# Patient Record
Sex: Female | Born: 1980 | Hispanic: Yes | Marital: Single | Smoking: Current every day smoker
Health system: Southern US, Community
[De-identification: ages and names within clinical notes are randomized; demographics above are authoritative.]

## PROBLEM LIST (undated history)

## (undated) VITALS — BP 124/82 | HR 91 | Temp 97.5°F | Resp 20 | Ht 64.0 in | Wt 178.0 lb

## (undated) DIAGNOSIS — A599 Trichomoniasis, unspecified: Secondary | ICD-10-CM

## (undated) DIAGNOSIS — M797 Fibromyalgia: Secondary | ICD-10-CM

## (undated) DIAGNOSIS — Z720 Tobacco use: Secondary | ICD-10-CM

## (undated) DIAGNOSIS — F209 Schizophrenia, unspecified: Secondary | ICD-10-CM

## (undated) DIAGNOSIS — F329 Major depressive disorder, single episode, unspecified: Secondary | ICD-10-CM

## (undated) DIAGNOSIS — N390 Urinary tract infection, site not specified: Secondary | ICD-10-CM

## (undated) DIAGNOSIS — F419 Anxiety disorder, unspecified: Secondary | ICD-10-CM

## (undated) DIAGNOSIS — F319 Bipolar disorder, unspecified: Secondary | ICD-10-CM

## (undated) DIAGNOSIS — F1010 Alcohol abuse, uncomplicated: Secondary | ICD-10-CM

## (undated) DIAGNOSIS — F32A Depression, unspecified: Secondary | ICD-10-CM

## (undated) DIAGNOSIS — M419 Scoliosis, unspecified: Secondary | ICD-10-CM

## (undated) DIAGNOSIS — F1910 Other psychoactive substance abuse, uncomplicated: Secondary | ICD-10-CM

## (undated) DIAGNOSIS — T7422XA Child sexual abuse, confirmed, initial encounter: Secondary | ICD-10-CM

## (undated) HISTORY — PX: KNEE ARTHROSCOPY: SUR90

## (undated) HISTORY — DX: Other psychoactive substance abuse, uncomplicated: F19.10

## (undated) HISTORY — PX: DILATION AND CURETTAGE OF UTERUS: SHX78

---

## 2009-10-21 ENCOUNTER — Emergency Department (HOSPITAL_COMMUNITY): Admission: EM | Admit: 2009-10-21 | Discharge: 2009-10-21 | Payer: Self-pay | Admitting: Emergency Medicine

## 2010-01-14 NOTE — L&D Delivery Note (Signed)
Delivery Note At 7:44 AM a viable and healthy female was delivered via Vaginal, Spontaneous Delivery (Presentation: Left Occiput Anterior).  APGAR: , ; weight .   Placenta status: Intact, Spontaneous.  Cord: 3 vessels with the following complications: None.  Cord pH: n/a  No difficulty with shoulders.  Anesthesia: Epidural  Episiotomy: None Lacerations: First degree labial and perineal lacerations, superficial Suture Repair: Not bleeding, not repaired Est. Blood Loss (mL):   Mom to postpartum.  Baby to nursery-stable.  [facility] 11/27/2010, 7:57 AM

## 2010-04-14 ENCOUNTER — Emergency Department (HOSPITAL_COMMUNITY)
Admission: EM | Admit: 2010-04-14 | Discharge: 2010-04-14 | Disposition: A | Discharge status: Home / Self Care | Payer: Medicare Other | Attending: Emergency Medicine | Admitting: Emergency Medicine

## 2010-04-14 DIAGNOSIS — F209 Schizophrenia, unspecified: Secondary | ICD-10-CM | POA: Insufficient documentation

## 2010-04-14 DIAGNOSIS — F319 Bipolar disorder, unspecified: Secondary | ICD-10-CM | POA: Insufficient documentation

## 2010-04-14 DIAGNOSIS — F411 Generalized anxiety disorder: Secondary | ICD-10-CM | POA: Insufficient documentation

## 2010-04-14 DIAGNOSIS — Z760 Encounter for issue of repeat prescription: Secondary | ICD-10-CM | POA: Insufficient documentation

## 2010-04-18 ENCOUNTER — Inpatient Hospital Stay (HOSPITAL_COMMUNITY)
Admission: AD | Admit: 2010-04-18 | Discharge: 2010-04-18 | Disposition: A | Discharge status: Home / Self Care | Payer: Medicare Other | Source: Ambulatory Visit | Attending: Obstetrics & Gynecology | Admitting: Obstetrics & Gynecology

## 2010-04-18 DIAGNOSIS — A499 Bacterial infection, unspecified: Secondary | ICD-10-CM | POA: Insufficient documentation

## 2010-04-18 DIAGNOSIS — A5901 Trichomonal vulvovaginitis: Secondary | ICD-10-CM

## 2010-04-18 DIAGNOSIS — O98819 Other maternal infectious and parasitic diseases complicating pregnancy, unspecified trimester: Secondary | ICD-10-CM | POA: Insufficient documentation

## 2010-04-18 DIAGNOSIS — B9689 Other specified bacterial agents as the cause of diseases classified elsewhere: Secondary | ICD-10-CM | POA: Insufficient documentation

## 2010-04-18 DIAGNOSIS — O2390 Unspecified genitourinary tract infection in pregnancy, unspecified trimester: Secondary | ICD-10-CM | POA: Insufficient documentation

## 2010-04-18 DIAGNOSIS — N760 Acute vaginitis: Secondary | ICD-10-CM | POA: Insufficient documentation

## 2010-04-18 DIAGNOSIS — Z331 Pregnant state, incidental: Secondary | ICD-10-CM

## 2010-04-18 LAB — WET PREP, GENITAL: Yeast Wet Prep HPF POC: NONE SEEN

## 2010-04-19 LAB — GC/CHLAMYDIA PROBE AMP, GENITAL
Chlamydia, DNA Probe: NEGATIVE
GC Probe Amp, Genital: NEGATIVE

## 2010-05-16 ENCOUNTER — Other Ambulatory Visit: Payer: Self-pay | Admitting: Obstetrics and Gynecology

## 2010-05-16 DIAGNOSIS — Z331 Pregnant state, incidental: Secondary | ICD-10-CM

## 2010-05-16 DIAGNOSIS — O99340 Other mental disorders complicating pregnancy, unspecified trimester: Secondary | ICD-10-CM

## 2010-05-16 DIAGNOSIS — O3680X0 Pregnancy with inconclusive fetal viability, not applicable or unspecified: Secondary | ICD-10-CM

## 2010-05-16 DIAGNOSIS — O2390 Unspecified genitourinary tract infection in pregnancy, unspecified trimester: Secondary | ICD-10-CM

## 2010-05-16 LAB — CULTURE, OB URINE: Urine Culture, OB: 9000

## 2010-05-16 LAB — CBC
HCT: 37 % (ref 36–46)
Hemoglobin: 13.2 g/dL (ref 12.0–16.0)
Platelets: 250 10*3/uL (ref 150–399)

## 2010-05-16 LAB — ABO/RH

## 2010-05-16 LAB — POCT URINALYSIS DIP (DEVICE)
Ketones, ur: NEGATIVE mg/dL
Protein, ur: NEGATIVE mg/dL
Specific Gravity, Urine: 1.01 (ref 1.005–1.030)
Urobilinogen, UA: 0.2 mg/dL (ref 0.0–1.0)
pH: 6 (ref 5.0–8.0)

## 2010-05-16 LAB — RUBELLA ANTIBODY, IGM: Rubella: IMMUNE

## 2010-05-22 ENCOUNTER — Ambulatory Visit (HOSPITAL_COMMUNITY)
Admission: RE | Admit: 2010-05-22 | Discharge: 2010-05-22 | Disposition: A | Discharge status: Home / Self Care | Payer: Medicare Other | Source: Ambulatory Visit | Attending: Obstetrics and Gynecology | Admitting: Obstetrics and Gynecology

## 2010-05-22 ENCOUNTER — Ambulatory Visit (HOSPITAL_COMMUNITY): Payer: Medicare Other

## 2010-05-22 DIAGNOSIS — Z3689 Encounter for other specified antenatal screening: Secondary | ICD-10-CM | POA: Insufficient documentation

## 2010-05-22 DIAGNOSIS — O3680X0 Pregnancy with inconclusive fetal viability, not applicable or unspecified: Secondary | ICD-10-CM

## 2010-06-13 ENCOUNTER — Other Ambulatory Visit: Payer: Self-pay | Admitting: Obstetrics and Gynecology

## 2010-06-13 DIAGNOSIS — O99330 Smoking (tobacco) complicating pregnancy, unspecified trimester: Secondary | ICD-10-CM

## 2010-06-13 DIAGNOSIS — O99340 Other mental disorders complicating pregnancy, unspecified trimester: Secondary | ICD-10-CM

## 2010-06-13 DIAGNOSIS — Z331 Pregnant state, incidental: Secondary | ICD-10-CM

## 2010-06-13 LAB — POCT URINALYSIS DIP (DEVICE)
Glucose, UA: NEGATIVE mg/dL
Hgb urine dipstick: NEGATIVE
Nitrite: NEGATIVE
Protein, ur: NEGATIVE mg/dL
Specific Gravity, Urine: >=1.03 (ref 1.005–1.030)
Urobilinogen, UA: 0.2 mg/dL (ref 0.0–1.0)
pH: 6 (ref 5.0–8.0)

## 2010-06-14 ENCOUNTER — Ambulatory Visit: Payer: Medicare Other | Admitting: Obstetrics and Gynecology

## 2010-07-11 ENCOUNTER — Other Ambulatory Visit: Payer: Self-pay | Admitting: Obstetrics and Gynecology

## 2010-07-11 DIAGNOSIS — O99340 Other mental disorders complicating pregnancy, unspecified trimester: Secondary | ICD-10-CM

## 2010-07-11 DIAGNOSIS — Z3689 Encounter for other specified antenatal screening: Secondary | ICD-10-CM

## 2010-07-11 LAB — POCT URINALYSIS DIP (DEVICE)
Bilirubin Urine: NEGATIVE
Hgb urine dipstick: NEGATIVE
Leukocytes, UA: NEGATIVE
Nitrite: NEGATIVE
Protein, ur: NEGATIVE mg/dL
Urobilinogen, UA: 0.2 mg/dL (ref 0.0–1.0)
pH: 6 (ref 5.0–8.0)

## 2010-07-12 ENCOUNTER — Ambulatory Visit (HOSPITAL_COMMUNITY)
Admission: RE | Admit: 2010-07-12 | Discharge: 2010-07-12 | Disposition: A | Discharge status: Home / Self Care | Payer: Medicare Other | Source: Ambulatory Visit | Attending: Obstetrics and Gynecology | Admitting: Obstetrics and Gynecology

## 2010-07-12 DIAGNOSIS — Z3689 Encounter for other specified antenatal screening: Secondary | ICD-10-CM | POA: Insufficient documentation

## 2010-08-01 DIAGNOSIS — F1920 Other psychoactive substance dependence, uncomplicated: Secondary | ICD-10-CM

## 2010-08-01 DIAGNOSIS — O99320 Drug use complicating pregnancy, unspecified trimester: Secondary | ICD-10-CM

## 2010-08-01 DIAGNOSIS — O99330 Smoking (tobacco) complicating pregnancy, unspecified trimester: Secondary | ICD-10-CM

## 2010-08-01 DIAGNOSIS — O99340 Other mental disorders complicating pregnancy, unspecified trimester: Secondary | ICD-10-CM

## 2010-08-17 ENCOUNTER — Inpatient Hospital Stay (HOSPITAL_COMMUNITY)
Admission: AD | Admit: 2010-08-17 | Discharge: 2010-08-17 | Disposition: A | Discharge status: Home / Self Care | Payer: Medicare Other | Source: Ambulatory Visit | Attending: Obstetrics and Gynecology | Admitting: Obstetrics and Gynecology

## 2010-08-17 ENCOUNTER — Encounter (HOSPITAL_COMMUNITY): Payer: Self-pay

## 2010-08-17 DIAGNOSIS — W010XXA Fall on same level from slipping, tripping and stumbling without subsequent striking against object, initial encounter: Secondary | ICD-10-CM | POA: Insufficient documentation

## 2010-08-17 DIAGNOSIS — O99891 Other specified diseases and conditions complicating pregnancy: Secondary | ICD-10-CM | POA: Insufficient documentation

## 2010-08-17 DIAGNOSIS — Z3400 Encounter for supervision of normal first pregnancy, unspecified trimester: Secondary | ICD-10-CM

## 2010-08-17 DIAGNOSIS — Y92009 Unspecified place in unspecified non-institutional (private) residence as the place of occurrence of the external cause: Secondary | ICD-10-CM | POA: Insufficient documentation

## 2010-08-17 DIAGNOSIS — R109 Unspecified abdominal pain: Secondary | ICD-10-CM | POA: Insufficient documentation

## 2010-08-17 HISTORY — DX: Bipolar disorder, unspecified: F31.9

## 2010-08-17 HISTORY — DX: Child sexual abuse, confirmed, initial encounter: T74.22XA

## 2010-08-17 HISTORY — DX: Anxiety disorder, unspecified: F41.9

## 2010-08-17 HISTORY — DX: Schizophrenia, unspecified: F20.9

## 2010-08-17 HISTORY — DX: Trichomoniasis, unspecified: A59.9

## 2010-08-17 NOTE — ED Provider Notes (Signed)
Pt discussed with the resident and agree with her hx and physical.  Pt s/p obs for 2 hours.  FHT w/accel, no decel.  Baseline 140s, mod var.  Will d/c home with warnings and close follow up.
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
## 2010-08-17 NOTE — Progress Notes (Signed)
Pt fell in the abdomen and hit on the right lower side this am. No vaginal bleeding

## 2010-08-17 NOTE — Progress Notes (Signed)
Pt states slipped and fell in the shower and hit her R side. Was having a lot of pain on arrival but states no pain at this time. Positive FHR with movement, no bleeding or leaking.

## 2010-08-29 ENCOUNTER — Other Ambulatory Visit: Payer: Self-pay | Admitting: Obstetrics & Gynecology

## 2010-08-29 ENCOUNTER — Ambulatory Visit: Payer: Medicare Other | Admitting: Family Medicine

## 2010-08-29 ENCOUNTER — Other Ambulatory Visit: Payer: Self-pay | Admitting: Family Medicine

## 2010-08-29 DIAGNOSIS — O99320 Drug use complicating pregnancy, unspecified trimester: Secondary | ICD-10-CM

## 2010-08-29 DIAGNOSIS — O99340 Other mental disorders complicating pregnancy, unspecified trimester: Secondary | ICD-10-CM

## 2010-08-29 DIAGNOSIS — O99330 Smoking (tobacco) complicating pregnancy, unspecified trimester: Secondary | ICD-10-CM

## 2010-08-29 DIAGNOSIS — F1920 Other psychoactive substance dependence, uncomplicated: Secondary | ICD-10-CM

## 2010-08-29 DIAGNOSIS — N898 Other specified noninflammatory disorders of vagina: Secondary | ICD-10-CM

## 2010-08-29 DIAGNOSIS — N760 Acute vaginitis: Secondary | ICD-10-CM

## 2010-08-29 LAB — POCT URINALYSIS DIP (DEVICE)
Ketones, ur: NEGATIVE mg/dL
Nitrite: NEGATIVE
Protein, ur: NEGATIVE mg/dL
Urobilinogen, UA: 0.2 mg/dL (ref 0.0–1.0)
pH: 6.5 (ref 5.0–8.0)

## 2010-08-29 LAB — WET PREP, GENITAL: Yeast Wet Prep HPF POC: NONE SEEN

## 2010-09-18 DIAGNOSIS — O99340 Other mental disorders complicating pregnancy, unspecified trimester: Secondary | ICD-10-CM | POA: Insufficient documentation

## 2010-09-18 DIAGNOSIS — O99330 Smoking (tobacco) complicating pregnancy, unspecified trimester: Secondary | ICD-10-CM | POA: Insufficient documentation

## 2010-09-18 DIAGNOSIS — R8271 Bacteriuria: Secondary | ICD-10-CM | POA: Insufficient documentation

## 2010-09-18 DIAGNOSIS — A64 Unspecified sexually transmitted disease: Secondary | ICD-10-CM | POA: Insufficient documentation

## 2010-09-18 DIAGNOSIS — F411 Generalized anxiety disorder: Secondary | ICD-10-CM

## 2010-09-18 DIAGNOSIS — F1910 Other psychoactive substance abuse, uncomplicated: Secondary | ICD-10-CM

## 2010-10-03 ENCOUNTER — Encounter: Payer: Self-pay | Admitting: Obstetrics and Gynecology

## 2010-10-10 ENCOUNTER — Inpatient Hospital Stay (HOSPITAL_COMMUNITY): Payer: Medicare Other

## 2010-10-10 ENCOUNTER — Encounter (HOSPITAL_COMMUNITY): Payer: Self-pay | Admitting: *Deleted

## 2010-10-10 ENCOUNTER — Ambulatory Visit (INDEPENDENT_AMBULATORY_CARE_PROVIDER_SITE_OTHER): Payer: Medicare Other | Admitting: Family Medicine

## 2010-10-10 ENCOUNTER — Inpatient Hospital Stay (HOSPITAL_COMMUNITY)
Admission: AD | Admit: 2010-10-10 | Discharge: 2010-10-10 | Disposition: A | Discharge status: Home / Self Care | Payer: Medicare Other | Source: Ambulatory Visit | Attending: Family Medicine | Admitting: Family Medicine

## 2010-10-10 VITALS — BP 109/69 | HR 103 | Temp 97.1°F | Wt 166.3 lb

## 2010-10-10 DIAGNOSIS — O368390 Maternal care for abnormalities of the fetal heart rate or rhythm, unspecified trimester, not applicable or unspecified: Secondary | ICD-10-CM | POA: Insufficient documentation

## 2010-10-10 DIAGNOSIS — Z3480 Encounter for supervision of other normal pregnancy, unspecified trimester: Secondary | ICD-10-CM

## 2010-10-10 DIAGNOSIS — O368190 Decreased fetal movements, unspecified trimester, not applicable or unspecified: Secondary | ICD-10-CM | POA: Insufficient documentation

## 2010-10-10 HISTORY — DX: Urinary tract infection, site not specified: N39.0

## 2010-10-10 LAB — POCT URINALYSIS DIP (DEVICE)
Hgb urine dipstick: NEGATIVE
Ketones, ur: NEGATIVE mg/dL
Protein, ur: NEGATIVE mg/dL
Specific Gravity, Urine: <=1.005 (ref 1.005–1.030)

## 2010-10-10 NOTE — Progress Notes (Signed)
Edema: fingers  Pain: pelvic  Pressure: pelvic, vagina C/o rectal bleed a week ago.

## 2010-10-10 NOTE — Progress Notes (Signed)
Subjective:    Nastassja Witkop is a 30 y.o. female being seen today for her obstetrical visit. She is at [redacted]w[redacted]d gestation. Patient reports decrease in fetal movement. No bleeding, LOF or contractions.. Fetal movement: decreased.  Menstrual History: OB History    Grav Para Term Preterm Abortions TAB SAB Ect Mult Living   3    2 1 1    0       Objective:    BP 109/69  Pulse 103  Temp 97.1 F (36.2 C)  Wt 166 lb 4.8 oz (75.433 kg) FHT:  138 BPM  Uterine Size: size equals dates and 31.5  Presentation: cephalic     Assessment:    Pregnancy 31 and 5/7 weeks  Decreased Fetal Movement  Plan:    28-week labs reviewed, normal Will send patient to MAU for NST and BPP. Will counsel patient after examination prior to MAU discharge. Follow up in 2 Weeks.

## 2010-10-10 NOTE — Plan of Care (Signed)
Pt is not in the lobby when called to triage 

## 2010-10-10 NOTE — ED Provider Notes (Signed)
I saw this patient in clinic earlier today and had her come to MAU for NST and BPP. No new complaints.  NST reactive, Category 1. BPP 6/8, no breathing sustained.  Will discharge from MAU with precautions. Pt has appt on Oct 3 in [facility].

## 2010-10-11 ENCOUNTER — Encounter: Payer: Self-pay | Admitting: Obstetrics & Gynecology

## 2010-10-17 ENCOUNTER — Encounter: Payer: Medicare Other | Admitting: Obstetrics and Gynecology

## 2010-10-31 ENCOUNTER — Ambulatory Visit (INDEPENDENT_AMBULATORY_CARE_PROVIDER_SITE_OTHER): Payer: Medicare Other | Admitting: Advanced Practice Midwife

## 2010-10-31 VITALS — BP 108/64 | Temp 97.7°F | Wt 175.1 lb

## 2010-10-31 DIAGNOSIS — J029 Acute pharyngitis, unspecified: Secondary | ICD-10-CM

## 2010-10-31 DIAGNOSIS — Z3490 Encounter for supervision of normal pregnancy, unspecified, unspecified trimester: Secondary | ICD-10-CM

## 2010-10-31 DIAGNOSIS — Z3480 Encounter for supervision of other normal pregnancy, unspecified trimester: Secondary | ICD-10-CM

## 2010-10-31 LAB — POCT URINALYSIS DIP (DEVICE)
Bilirubin Urine: NEGATIVE
Glucose, UA: NEGATIVE mg/dL
Hgb urine dipstick: NEGATIVE
Specific Gravity, Urine: 1.025 (ref 1.005–1.030)
Urobilinogen, UA: 0.2 mg/dL (ref 0.0–1.0)
pH: 6.5 (ref 5.0–8.0)

## 2010-10-31 MED ORDER — PENICILLIN V POTASSIUM 500 MG PO TABS: 500.0000 mg | ORAL_TABLET | Freq: Three times a day (TID) | ORAL | Status: AC

## 2010-10-31 NOTE — Progress Notes (Signed)
Pulse- 106.  No vaginal discharge.  Pt c/o of "sinus infection, ringing in ears" Pt was given info on flu vaccine, will not get vaccine today due to not feeling well.

## 2010-10-31 NOTE — Patient Instructions (Addendum)
Pregnancy - Third Trimester The third trimester begins at the 28th week of pregnancy and ends at birth. It is important to follow your doctor's instructions. HOME CARE  Keep your doctor's appointments.   Do not smoke.   Do not drink alcohol or use drugs.   Only take medicine the doctor tells you to take.   Take prenatal vitamins as directed. The vitamin should contain 1 milligram of folic acid.   Exercise.   Eat healthy foods. Eat regular, well-balanced meals.   You can have sex (intercourse) if there are no other problems with the pregnancy.   Do not use hot tubs, steam rooms, or saunas.   Wear a seat belt while driving.   Avoid raw meat, uncooked cheese, and litter boxes and soil used by cats.   Rest with your legs raised (elevated).   Make a list of emergency phone numbers. Keep this list with you.   Arrange for help when you come back home after delivering the baby.   Make a trial run to the hospital.   Take prenatal classes.   Prepare the baby's nursery.   Do not travel out of the city. If you absolutely have to, get permission from your doctor first.   Wear flat shoes. Do not wear high heels.  GET HELP IF:  You have any concerns or worries during your pregnancy.  GET HELP RIGHT AWAY IF:  You have a temperature by mouth above 100.5, not controlled by medicine.   You have not felt the baby move for more than 1 hour. If you think the baby is not moving as much as normal, eat something with sugar in it or lie down on your left side for an hour. The baby should move at least 4 to 5 times per hour.   Fluid is coming from the vagina.   Blood is coming from the vagina. Light spotting is common, especially after sex (intercourse).   You have belly (abdominal) pain.   You have a bad smelling fluid (discharge) coming from the vagina. The fluid changes from clear to white.   You still feel sick to your stomach (nauseous).   You throw up (vomit) more than 24  hours.   You have the chills.   You have shortness of breath.   You have a burning feeling when you pee (urinate).   You loose or gain more than 2 pounds (0.9 kilograms) of weight over a weeks time, or as suggested by your doctor.   Your face, hands, feet, or legs get puffy (swell).   You have a bad headache that will not go away.   You start to have problems seeing (blurry or double vision).   You fall, are in a car accident, or have any kind of trauma.   There is mental or physical violence at home.  MAKE SURE YOU:   Understand these instructions.   Will watch your condition.   Will get help right away if you are not doing well or get worse.  Document Released: 03/27/2009  [employer] Patient Information 2011 [employer]. 

## 2010-10-31 NOTE — Progress Notes (Signed)
Addended by: Archie Patten on: 10/31/2010 11:36 AM   Modules accepted: Orders

## 2010-10-31 NOTE — Progress Notes (Signed)
Pt seen with PA student, agree with above note.

## 2010-10-31 NOTE — Progress Notes (Signed)
[name] is a 30 y.o. G30P0020 Female seen today for her obstetrical visit. She complains of sore throat, difficulty swallowing, and sinus congestion for the past 10 days. She does not think she has had a fever, but has not checked her temp. + exposure to strep throat x 2 weeks ago. Otherwise, she has no complaints. Still feels good fetal movement. Denies loss of fluid, vaginal bleeding, or regular contractions. Some BH contractions. Discussed signs of labor. Performed rapid strep throat swab for PE signs of lymphadenopathy, erythematous posterior oropharynx w/ exudate. Rx Penicillin V 500 mg TID x 10 days.

## 2010-11-02 LAB — CULTURE, GROUP A STREP

## 2010-11-14 ENCOUNTER — Inpatient Hospital Stay (HOSPITAL_COMMUNITY)
Admission: AD | Admit: 2010-11-14 | Discharge: 2010-11-14 | Disposition: A | Discharge status: Home / Self Care | Payer: Medicare Other | Source: Ambulatory Visit | Attending: Obstetrics & Gynecology | Admitting: Obstetrics & Gynecology

## 2010-11-14 DIAGNOSIS — O4700 False labor before 37 completed weeks of gestation, unspecified trimester: Secondary | ICD-10-CM | POA: Insufficient documentation

## 2010-11-14 DIAGNOSIS — N760 Acute vaginitis: Secondary | ICD-10-CM | POA: Insufficient documentation

## 2010-11-14 DIAGNOSIS — A499 Bacterial infection, unspecified: Secondary | ICD-10-CM | POA: Insufficient documentation

## 2010-11-14 DIAGNOSIS — B9689 Other specified bacterial agents as the cause of diseases classified elsewhere: Secondary | ICD-10-CM | POA: Insufficient documentation

## 2010-11-14 LAB — WET PREP, GENITAL: Yeast Wet Prep HPF POC: NONE SEEN

## 2010-11-14 MED ORDER — METRONIDAZOLE 500 MG PO TABS: 500.0000 mg | ORAL_TABLET | Freq: Two times a day (BID) | ORAL | Status: AC

## 2010-11-14 NOTE — Progress Notes (Signed)
Pt sitting straight up in bed.  Maternal tracing noted.

## 2010-11-14 NOTE — Progress Notes (Cosign Needed)
Pt G3 P0 at 36.5wks contracting, denies bleeding and leaking.  Pt denies problems with pregnancy.

## 2010-11-14 NOTE — Progress Notes (Signed)
Pt has been hurting for weeks.
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
## 2010-11-14 NOTE — Progress Notes (Signed)
Notified of pt presenting for labor check. Notified of pt c/o discharge for a week.  Notified of inability to reach cervix.  Order for wet prep.  Will be up to check cervix.

## 2010-11-19 NOTE — ED Provider Notes (Signed)
Agree with above note.  Mattison Stuckey 11/19/2010 11:04 AM   

## 2010-11-21 ENCOUNTER — Encounter: Payer: Self-pay | Admitting: Family Medicine

## 2010-11-21 ENCOUNTER — Ambulatory Visit (INDEPENDENT_AMBULATORY_CARE_PROVIDER_SITE_OTHER): Payer: Medicare Other | Admitting: Family Medicine

## 2010-11-21 VITALS — BP 110/73 | Temp 97.7°F | Wt 180.0 lb

## 2010-11-21 DIAGNOSIS — Z3400 Encounter for supervision of normal first pregnancy, unspecified trimester: Secondary | ICD-10-CM

## 2010-11-21 DIAGNOSIS — Z331 Pregnant state, incidental: Secondary | ICD-10-CM

## 2010-11-21 LAB — POCT URINALYSIS DIP (DEVICE)
Glucose, UA: NEGATIVE mg/dL
Protein, ur: 30 mg/dL — AB
Urobilinogen, UA: 0.2 mg/dL (ref 0.0–1.0)

## 2010-11-21 NOTE — Progress Notes (Signed)
Seeing "floaters" when coughs.  Mild swelling in hands and legs, no headache, vision changes.  Occasional contraction.  No other complaint.  No vaginal bleeding, vaginal discharge.  Moderate leukocytes - will send culture.  Good fetal activity.  Will follow up in 1 week.

## 2010-11-21 NOTE — Patient Instructions (Signed)
Pregnancy - Third Trimester The third trimester of pregnancy (the last 3 months) is a period of the most rapid growth for you and your baby. The baby approaches a length of 20 inches and a weight of 6 to 10 pounds. The baby is adding on fat and getting ready for life outside your body. While inside, babies have periods of sleeping and waking, suck their thumbs, and hiccups. You can often feel small contractions of the uterus. This is false labor. It is also called Braxton-Hicks contractions. This is like a practice for labor. The usual problems in this stage of pregnancy include more difficulty breathing, swelling of the hands and feet from water retention, and having to urinate more often because of the uterus and baby pressing on your bladder.  PRENATAL EXAMS  Blood work may continue to be done during prenatal exams. These tests are done to check on your health and the probable health of your baby. Blood work is used to follow your blood levels (hemoglobin). Anemia (low hemoglobin) is common during pregnancy. Iron and vitamins are given to help prevent this. You may also continue to be checked for diabetes. Some of the past blood tests may be done again.   The size of the uterus is measured during each visit. This makes sure your baby is growing properly according to your pregnancy dates.   Your blood pressure is checked every prenatal visit. This is to make sure you are not getting toxemia.   Your urine is checked every prenatal visit for infection, diabetes and protein.   Your weight is checked at each visit. This is done to make sure gains are happening at the suggested rate and that you and your baby are growing normally.   Sometimes, an ultrasound is performed to confirm the position and the proper growth and development of the baby. This is a test done that bounces harmless sound waves off the baby so your caregiver can more accurately determine due dates.   Discuss the type of pain  medication and anesthesia you will have during your labor and delivery.   Discuss the possibility and anesthesia if a Cesarean Section might be necessary.   Inform your caregiver if there is any mental or physical violence at home.  Sometimes, a specialized non-stress test, contraction stress test and biophysical profile are done to make sure the baby is not having a problem. Checking the amniotic fluid surrounding the baby is called an amniocentesis. The amniotic fluid is removed by sticking a needle into the belly (abdomen). This is sometimes done near the end of pregnancy if an early delivery is required. In this case, it is done to help make sure the baby's lungs are mature enough for the baby to live outside of the womb. If the lungs are not mature and it is unsafe to deliver the baby, an injection of cortisone medication is given to the mother 1 to 2 days before the delivery. This helps the baby's lungs mature and makes it safer to deliver the baby. CHANGES OCCURING IN THE THIRD TRIMESTER OF PREGNANCY Your body goes through many changes during pregnancy. They vary from person to person. Talk to your caregiver about changes you notice and are concerned about.  During the last trimester, you have probably had an increase in your appetite. It is normal to have cravings for certain foods. This varies from person to person and pregnancy to pregnancy.   You may begin to get stretch marks on your hips,   abdomen, and breasts. These are normal changes in the body during pregnancy. There are no exercises or medications to take which prevent this change.   Constipation may be treated with a stool softener or adding bulk to your diet. Drinking lots of fluids, fiber in vegetables, fruits, and whole grains are helpful.   Exercising is also helpful. If you have been very active up until your pregnancy, most of these activities can be continued during your pregnancy. If you have been less active, it is helpful  to start an exercise program such as walking. Consult your caregiver before starting exercise programs.   Avoid all smoking, alcohol, un-prescribed drugs, herbs and "street drugs" during your pregnancy. These chemicals affect the formation and growth of the baby. Avoid chemicals throughout the pregnancy to ensure the delivery of a healthy infant.   Backache, varicose veins and hemorrhoids may develop or get worse.   You will tire more easily in the third trimester, which is normal.   The baby's movements may be stronger and more often.   You may become short of breath easily.   Your belly button may stick out.   A yellow discharge may leak from your breasts called colostrum.   You may have a bloody mucus discharge. This usually occurs a few days to a week before labor begins.  HOME CARE INSTRUCTIONS   Keep your caregiver's appointments. Follow your caregiver's instructions regarding medication use, exercise, and diet.   During pregnancy, you are providing food for you and your baby. Continue to eat regular, well-balanced meals. Choose foods such as meat, fish, milk and other low fat dairy products, vegetables, fruits, and whole-grain breads and cereals. Your caregiver will tell you of the ideal weight gain.   A physical sexual relationship may be continued throughout pregnancy if there are no other problems such as early (premature) leaking of amniotic fluid from the membranes, vaginal bleeding, or belly (abdominal) pain.   Exercise regularly if there are no restrictions. Check with your caregiver if you are unsure of the safety of your exercises. Greater weight gain will occur in the last 2 trimesters of pregnancy. Exercising helps:   Control your weight.   Get you in shape for labor and delivery.   You lose weight after you deliver.   Rest a lot with legs elevated, or as needed for leg cramps or low back pain.   Wear a good support or jogging bra for breast tenderness during  pregnancy. This may help if worn during sleep. Pads or tissues may be used in the bra if you are leaking colostrum.   Do not use hot tubs, steam rooms, or saunas.   Wear your seat belt when driving. This protects you and your baby if you are in an accident.   Avoid raw meat, cat litter boxes and soil used by cats. These carry germs that can cause birth defects in the baby.   It is easier to loose urine during pregnancy. Tightening up and strengthening the pelvic muscles will help with this problem. You can practice stopping your urination while you are going to the bathroom. These are the same muscles you need to strengthen. It is also the muscles you would use if you were trying to stop from passing gas. You can practice tightening these muscles up 10 times a set and repeating this about 3 times per day. Once you know what muscles to tighten up, do not perform these exercises during urination. It is more likely   to cause an infection by backing up the urine.   Ask for help if you have financial, counseling or nutritional needs during pregnancy. Your caregiver will be able to offer counseling for these needs as well as refer you for other special needs.   Make a list of emergency phone numbers and have them available.   Plan on getting help from family or friends when you go home from the hospital.   Make a trial run to the hospital.   Take prenatal classes with the father to understand, practice and ask questions about the labor and delivery.   Prepare the baby's room/nursery.   Do not travel out of the city unless it is absolutely necessary and with the advice of your caregiver.   Wear only low or no heal shoes to have better balance and prevent falling.  MEDICATIONS AND DRUG USE IN PREGNANCY  Take prenatal vitamins as directed. The vitamin should contain 1 milligram of folic acid. Keep all vitamins out of reach of children. Only a couple vitamins or tablets containing iron may be fatal  to a baby or young child when ingested.   Avoid use of all medications, including herbs, over-the-counter medications, not prescribed or suggested by your caregiver. Only take over-the-counter or prescription medicines for pain, discomfort, or fever as directed by your caregiver. Do not use aspirin, ibuprofen (Motrin, Advil, Nuprin) or naproxen (Aleve) unless OK'd by your caregiver.   Let your caregiver also know about herbs you may be using.   Alcohol is related to a number of birth defects. This includes fetal alcohol syndrome. All alcohol, in any form, should be avoided completely. Smoking will cause low birth rate and premature babies.   Street/illegal drugs are very harmful to the baby. They are absolutely forbidden. A baby born to an addicted mother will be addicted at birth. The baby will go through the same withdrawal an adult does.  SEEK MEDICAL CARE IF: You have any concerns or worries during your pregnancy. It is better to call with your questions if you feel they cannot wait, rather than worry about them. DECISIONS ABOUT CIRCUMCISION You may or may not know the sex of your baby. If you know your baby is a boy, it may be time to think about circumcision. Circumcision is the removal of the foreskin of the penis. This is the skin that covers the sensitive end of the penis. There is no proven medical need for this. Often this decision is made on what is popular at the time or based upon religious beliefs and social issues. You can discuss these issues with your caregiver or pediatrician. SEEK IMMEDIATE MEDICAL CARE IF:   An unexplained oral temperature above 102 F (38.9 C) develops, or as your caregiver suggests.   You have leaking of fluid from the vagina (birth canal). If leaking membranes are suspected, take your temperature and tell your caregiver of this when you call.   There is vaginal spotting, bleeding or passing clots. Tell your caregiver of the amount and how many pads are  used.   You develop a bad smelling vaginal discharge with a change in the color from clear to white.   You develop vomiting that lasts more than 24 hours.   You develop chills or fever.   You develop shortness of breath.   You develop burning on urination.   You loose more than 2 pounds of weight or gain more than 2 pounds of weight or as suggested by your   caregiver.   You notice sudden swelling of your face, hands, and feet or legs.   You develop belly (abdominal) pain. Round ligament discomfort is a common non-cancerous (benign) cause of abdominal pain in pregnancy. Your caregiver still must evaluate you.   You develop a severe headache that does not go away.   You develop visual problems, blurred or double vision.   If you have not felt your baby move for more than 1 hour. If you think the baby is not moving as much as usual, eat something with sugar in it and lie down on your left side for an hour. The baby should move at least 4 to 5 times per hour. Call right away if your baby moves less than that.   You fall, are in a car accident or any kind of trauma.   There is mental or physical violence at home.  Document Released: 12/25/2000 Document Revised: 09/12/2010 Document Reviewed: 06/29/2008 [employer] Patient Information 2012 [employer]. 

## 2010-11-21 NOTE — Progress Notes (Signed)
Pulse 96. Pelvic pressure and leg pain. Pt states sees "spots when coughing". No vaginal discharge. Swelling in hands. Pt never started the Flagyl that was prescribed.

## 2010-11-23 ENCOUNTER — Telehealth: Payer: Self-pay | Admitting: *Deleted

## 2010-11-23 LAB — CULTURE, OB URINE: Colony Count: 15000

## 2010-11-23 MED ORDER — AMOXICILLIN 500 MG PO CAPS: 500.0000 mg | ORAL_CAPSULE | Freq: Two times a day (BID) | ORAL | Status: AC

## 2010-11-23 NOTE — Telephone Encounter (Signed)
Spoke w/pt and informed of urine cx results and need for treatment. Pt states she is allergic to Keflex and has not had problems with penicillins. Rx sent as prescribed for amoxicillin. Pt voiced understanding.

## 2010-11-23 NOTE — Telephone Encounter (Signed)
Message copied by Jill Side on Fri Nov 23, 2010  1:11 PM ------      Message from: Levie Heritage      Created: Fri Nov 23, 2010 10:30 AM       Please call patient with positive urine culture and see if she is able to take amoxicillin.  If so, please call in amoxicillin 500mg  bid.  If not able to take amoxicillin, then needs to take clindamycin 300mg  tid.        Thanks.

## 2010-11-27 ENCOUNTER — Encounter (HOSPITAL_COMMUNITY): Payer: Self-pay | Admitting: *Deleted

## 2010-11-27 ENCOUNTER — Encounter (HOSPITAL_COMMUNITY): Payer: Self-pay | Admitting: Anesthesiology

## 2010-11-27 ENCOUNTER — Inpatient Hospital Stay (HOSPITAL_COMMUNITY)
Admission: AD | Admit: 2010-11-27 | Discharge: 2010-11-29 | DRG: 775 | Disposition: A | Discharge status: Home / Self Care | Payer: Medicare Other | Source: Ambulatory Visit | Attending: Obstetrics & Gynecology | Admitting: Obstetrics & Gynecology

## 2010-11-27 ENCOUNTER — Inpatient Hospital Stay (HOSPITAL_COMMUNITY): Payer: Medicare Other | Admitting: Anesthesiology

## 2010-11-27 DIAGNOSIS — O99330 Smoking (tobacco) complicating pregnancy, unspecified trimester: Secondary | ICD-10-CM

## 2010-11-27 DIAGNOSIS — Z3400 Encounter for supervision of normal first pregnancy, unspecified trimester: Secondary | ICD-10-CM

## 2010-11-27 DIAGNOSIS — O99892 Other specified diseases and conditions complicating childbirth: Secondary | ICD-10-CM

## 2010-11-27 DIAGNOSIS — A64 Unspecified sexually transmitted disease: Secondary | ICD-10-CM

## 2010-11-27 DIAGNOSIS — O9989 Other specified diseases and conditions complicating pregnancy, childbirth and the puerperium: Secondary | ICD-10-CM

## 2010-11-27 DIAGNOSIS — F1910 Other psychoactive substance abuse, uncomplicated: Secondary | ICD-10-CM

## 2010-11-27 DIAGNOSIS — Z22330 Carrier of Group B streptococcus: Secondary | ICD-10-CM

## 2010-11-27 DIAGNOSIS — F411 Generalized anxiety disorder: Secondary | ICD-10-CM

## 2010-11-27 DIAGNOSIS — O99340 Other mental disorders complicating pregnancy, unspecified trimester: Secondary | ICD-10-CM

## 2010-11-27 DIAGNOSIS — IMO0001 Reserved for inherently not codable concepts without codable children: Secondary | ICD-10-CM

## 2010-11-27 LAB — CBC
HCT: 32.9 % — ABNORMAL LOW (ref 36.0–46.0)
MCH: 31.6 pg (ref 26.0–34.0)
MCV: 90.4 fL (ref 78.0–100.0)
Platelets: 212 10*3/uL (ref 150–400)
RBC: 3.64 MIL/uL — ABNORMAL LOW (ref 3.87–5.11)

## 2010-11-27 MED ORDER — BENZOCAINE-MENTHOL 20-0.5 % EX AERO
1.0000 "application " | INHALATION_SPRAY | CUTANEOUS | Status: DC | PRN
  Administered 2010-11-27: 1 via TOPICAL

## 2010-11-27 MED ORDER — OXYCODONE-ACETAMINOPHEN 5-325 MG PO TABS
1.0000 | ORAL_TABLET | ORAL | Status: DC | PRN
  Administered 2010-11-27: 1 via ORAL
  Administered 2010-11-27 – 2010-11-28 (×3): 2 via ORAL
  Administered 2010-11-28 – 2010-11-29 (×3): 1 via ORAL
  Administered 2010-11-29: 2 via ORAL
  Filled 2010-11-27 (×3): qty 2
  Filled 2010-11-27 (×2): qty 1
  Filled 2010-11-27 (×3): qty 2

## 2010-11-27 MED ORDER — DIPHENHYDRAMINE HCL 25 MG PO CAPS: 25.0000 mg | ORAL_CAPSULE | Freq: Four times a day (QID) | ORAL | Status: DC | PRN

## 2010-11-27 MED ORDER — OXYCODONE-ACETAMINOPHEN 5-325 MG PO TABS: 2.0000 | ORAL_TABLET | ORAL | Status: DC | PRN

## 2010-11-27 MED ORDER — ONDANSETRON HCL 4 MG/2ML IJ SOLN: 4.0000 mg | INTRAMUSCULAR | Status: DC | PRN

## 2010-11-27 MED ORDER — ZOLPIDEM TARTRATE 5 MG PO TABS: 5.0000 mg | ORAL_TABLET | Freq: Every evening | ORAL | Status: DC | PRN

## 2010-11-27 MED ORDER — ONDANSETRON HCL 4 MG/2ML IJ SOLN: 4.0000 mg | Freq: Four times a day (QID) | INTRAMUSCULAR | Status: DC | PRN

## 2010-11-27 MED ORDER — OXYTOCIN BOLUS FROM INFUSION
500.0000 mL | Freq: Once | INTRAVENOUS | Status: DC
  Filled 2010-11-27: qty 500

## 2010-11-27 MED ORDER — FENTANYL 2.5 MCG/ML BUPIVACAINE 1/10 % EPIDURAL INFUSION (WH - ANES)
INTRAMUSCULAR | Status: DC | PRN
  Administered 2010-11-27: 13 mL/h via EPIDURAL

## 2010-11-27 MED ORDER — LACTATED RINGERS IV SOLN: 500.0000 mL | Freq: Once | INTRAVENOUS | Status: DC

## 2010-11-27 MED ORDER — SODIUM CHLORIDE 0.9 % IV SOLN
2.0000 g | Freq: Four times a day (QID) | INTRAVENOUS | Status: DC
  Administered 2010-11-27: 2 g via INTRAVENOUS
  Filled 2010-11-27 (×3): qty 2000

## 2010-11-27 MED ORDER — PHENYLEPHRINE 40 MCG/ML (10ML) SYRINGE FOR IV PUSH (FOR BLOOD PRESSURE SUPPORT): 80.0000 ug | PREFILLED_SYRINGE | INTRAVENOUS | Status: DC | PRN

## 2010-11-27 MED ORDER — NALBUPHINE SYRINGE 5 MG/0.5 ML: 5.0000 mg | INJECTION | INTRAMUSCULAR | Status: DC | PRN

## 2010-11-27 MED ORDER — OXYTOCIN 20 UNITS IN LACTATED RINGERS INFUSION - SIMPLE
125.0000 mL/h | Freq: Once | INTRAVENOUS | Status: DC
  Administered 2010-11-27: 500 mL/h via INTRAVENOUS
  Filled 2010-11-27: qty 1000

## 2010-11-27 MED ORDER — PHENYLEPHRINE 40 MCG/ML (10ML) SYRINGE FOR IV PUSH (FOR BLOOD PRESSURE SUPPORT)
80.0000 ug | PREFILLED_SYRINGE | INTRAVENOUS | Status: DC | PRN
  Filled 2010-11-27: qty 5

## 2010-11-27 MED ORDER — LANOLIN HYDROUS EX OINT: TOPICAL_OINTMENT | CUTANEOUS | Status: DC | PRN

## 2010-11-27 MED ORDER — PRENATAL PLUS 27-1 MG PO TABS: 1.0000 | ORAL_TABLET | Freq: Every day | ORAL | Status: DC

## 2010-11-27 MED ORDER — DIBUCAINE 1 % RE OINT: 1.0000 "application " | TOPICAL_OINTMENT | RECTAL | Status: DC | PRN

## 2010-11-27 MED ORDER — IBUPROFEN 600 MG PO TABS
600.0000 mg | ORAL_TABLET | Freq: Four times a day (QID) | ORAL | Status: DC
  Administered 2010-11-27 – 2010-11-29 (×9): 600 mg via ORAL
  Filled 2010-11-27 (×9): qty 1

## 2010-11-27 MED ORDER — ONDANSETRON HCL 4 MG PO TABS: 4.0000 mg | ORAL_TABLET | ORAL | Status: DC | PRN

## 2010-11-27 MED ORDER — CITRIC ACID-SODIUM CITRATE 334-500 MG/5ML PO SOLN: 30.0000 mL | ORAL | Status: DC | PRN

## 2010-11-27 MED ORDER — SENNOSIDES-DOCUSATE SODIUM 8.6-50 MG PO TABS
2.0000 | ORAL_TABLET | Freq: Every day | ORAL | Status: DC
  Administered 2010-11-27 – 2010-11-28 (×2): 2 via ORAL

## 2010-11-27 MED ORDER — FENTANYL 2.5 MCG/ML BUPIVACAINE 1/10 % EPIDURAL INFUSION (WH - ANES)
14.0000 mL/h | INTRAMUSCULAR | Status: DC
  Filled 2010-11-27: qty 60

## 2010-11-27 MED ORDER — LIDOCAINE HCL (PF) 1 % IJ SOLN
INTRAMUSCULAR | Status: AC
  Filled 2010-11-27: qty 30

## 2010-11-27 MED ORDER — BENZOCAINE-MENTHOL 20-0.5 % EX AERO
INHALATION_SPRAY | CUTANEOUS | Status: AC
  Filled 2010-11-27: qty 56

## 2010-11-27 MED ORDER — LACTATED RINGERS IV SOLN
INTRAVENOUS | Status: DC
  Administered 2010-11-27 (×2): via INTRAVENOUS
  Administered 2010-11-27: 300 mL via INTRAVENOUS

## 2010-11-27 MED ORDER — FLEET ENEMA 7-19 GM/118ML RE ENEM: 1.0000 | ENEMA | RECTAL | Status: DC | PRN

## 2010-11-27 MED ORDER — LIDOCAINE HCL 1.5 % IJ SOLN
INTRAMUSCULAR | Status: DC | PRN
  Administered 2010-11-27 (×2): 5 mL via EPIDURAL

## 2010-11-27 MED ORDER — EPHEDRINE 5 MG/ML INJ
10.0000 mg | INTRAVENOUS | Status: DC | PRN
  Filled 2010-11-27: qty 4

## 2010-11-27 MED ORDER — IBUPROFEN 600 MG PO TABS: 600.0000 mg | ORAL_TABLET | Freq: Four times a day (QID) | ORAL | Status: DC | PRN

## 2010-11-27 MED ORDER — LIDOCAINE HCL (PF) 1 % IJ SOLN: 30.0000 mL | INTRAMUSCULAR | Status: DC | PRN

## 2010-11-27 MED ORDER — THERA M PLUS PO TABS
1.0000 | ORAL_TABLET | Freq: Every day | ORAL | Status: DC
  Filled 2010-11-27 (×3): qty 1

## 2010-11-27 MED ORDER — DIPHENHYDRAMINE HCL 50 MG/ML IJ SOLN: 12.5000 mg | INTRAMUSCULAR | Status: DC | PRN

## 2010-11-27 MED ORDER — SIMETHICONE 80 MG PO CHEW: 80.0000 mg | CHEWABLE_TABLET | ORAL | Status: DC | PRN

## 2010-11-27 MED ORDER — WITCH HAZEL-GLYCERIN EX PADS: 1.0000 "application " | MEDICATED_PAD | CUTANEOUS | Status: DC | PRN

## 2010-11-27 MED ORDER — LACTATED RINGERS IV SOLN: 500.0000 mL | INTRAVENOUS | Status: DC | PRN

## 2010-11-27 MED ORDER — TETANUS-DIPHTH-ACELL PERTUSSIS 5-2.5-18.5 LF-MCG/0.5 IM SUSP
0.5000 mL | Freq: Once | INTRAMUSCULAR | Status: AC
  Administered 2010-11-28: 0.5 mL via INTRAMUSCULAR
  Filled 2010-11-27: qty 0.5

## 2010-11-27 MED ORDER — ACETAMINOPHEN 325 MG PO TABS: 650.0000 mg | ORAL_TABLET | ORAL | Status: DC | PRN

## 2010-11-27 MED ORDER — EPHEDRINE 5 MG/ML INJ: 10.0000 mg | INTRAVENOUS | Status: DC | PRN

## 2010-11-27 NOTE — Progress Notes (Signed)
UR chart review completed.  

## 2010-11-27 NOTE — Progress Notes (Signed)
P t reports ROM at 0345, contractions since last pm.

## 2010-11-27 NOTE — Anesthesia Preprocedure Evaluation (Signed)
Anesthesia Evaluation  Patient identified by MRN, date of birth, ID band Patient awake    Reviewed: Allergy & Precautions, H&P , NPO status , Patient's Chart, lab work & pertinent test results  Airway Mallampati: II TM Distance: >3 FB Neck ROM: full    Dental No notable dental hx.    Pulmonary neg pulmonary ROS,  clear to auscultation  Pulmonary exam normal       Cardiovascular neg cardio ROS regular Normal    Neuro/Psych Negative Neurological ROS     GI/Hepatic negative GI ROS, Neg liver ROS,   Endo/Other  Negative Endocrine ROS  Renal/GU negative Renal ROS     Musculoskeletal negative musculoskeletal ROS (+)   Abdominal Normal abdominal exam  (+)   Peds negative pediatric ROS (+)  Hematology negative hematology ROS (+)   Anesthesia Other Findings   Reproductive/Obstetrics (+) Pregnancy                           Anesthesia Physical Anesthesia Plan  ASA: II  Anesthesia Plan: Epidural   Post-op Pain Management:    Induction:   Airway Management Planned:   Additional Equipment:   Intra-op Plan:   Post-operative Plan:   Informed Consent: I have reviewed the patients History and Physical, chart, labs and discussed the procedure including the risks, benefits and alternatives for the proposed anesthesia with the patient or authorized representative who has indicated his/her understanding and acceptance.     Plan Discussed with:   Anesthesia Plan Comments:         Anesthesia Quick Evaluation
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
## 2010-11-27 NOTE — Anesthesia Postprocedure Evaluation (Signed)
  Anesthesia Post-op Note  Patient: [name]  Procedure(s) Performed: * No procedures listed *  Patient Location: PACU and Mother/Baby  Anesthesia Type: Epidural  Level of Consciousness: awake, alert  and oriented  Airway and Oxygen Therapy: Patient Spontanous Breathing  Post-op Assessment: Post-op Vital signs reviewed, Patient's Cardiovascular Status Stable, No headache, No backache, No residual numbness and No residual motor weakness  Post-op Vital Signs: Reviewed and stable  Complications: No apparent anesthesia complications

## 2010-11-27 NOTE — ED Provider Notes (Signed)
History     Chief Complaint  Patient presents with  . Labor Eval   HPI Doing well but has increased pain with contractions.,   ROS Physical Exam   Blood pressure 114/67, pulse 88, temperature 97.9 F (36.6 C), resp. rate 18.  Physical Exam EFM:  Reassuring FHR 130s with accels UCs frequent Cx 8cm per RN  MAU Course  Procedures   Assessment and Plan  Active Labor, Transition'  Anticipate SVD soon  [facility] 11/27/2010, 4:56 AM

## 2010-11-28 NOTE — Progress Notes (Signed)
Post Partum Day 1 Subjective: Overall feeling well.  Has some general weakness but no dizziness and is ambulating without difficulty.  Eating and drinking ok.  Lochia=menses.  Still having pain in lower abdomen but this is well controlled with percocet and ibuprofen.  Objective: Blood pressure 94/52, pulse 80, temperature 98.5 F (36.9 C), temperature source Oral, resp. rate 18, SpO2 96.00%, unknown if currently breastfeeding.  Physical Exam:  General: NAD, AXOX3 Lochia: appropriate Uterine Fundus: firm Incision: n/a DVT Evaluation: No evidence of DVT seen on physical exam. No cords or calf tenderness.   Basename 11/27/10 0425  HGB 11.5*  HCT 32.9*    Assessment/Plan: Contraception would like OCPs Bottle feeding, declines lactation consult at this time Plan for discharge home today vs. Tomorrow depending on progress this afternoon Will follow-up in resident clinic.   LOS: 1 day   Drucie Ip 11/28/2010, 7:19 AM

## 2010-11-28 NOTE — Progress Notes (Signed)
PSYCHOSOCIAL ASSESSMENT ~ MATERNAL/CHILD Name: [name]                                                                                          Age: 30  Referral Date:       11 / 14  /12   Reason/Source: Social situation/ CN  I. FAMILY/HOME ENVIRONMENT A. Child's Legal Guardian __X_Parent(s) ___Grandparent ___Foster parent ___DSS_________________ Name: [name]                                     DOB: //                     Age: 30  Address: [address] , [place] [address]  Name:  [name]                                    DOB: //                     Age:  32  Address: [place]  B. Other Household Members/Support Persons Name: [name]               Relationship: mother            DOB ___/___/___                   Name:                                         Relationship:                        DOB ___/___/___                   Name:                                         Relationship:                        DOB ___/___/___                   Name:                                         Relationship:                        DOB ___/___/___  C. Other Support:   II. PSYCHOSOCIAL DATA A. Information Source                                                                                             

## 2010-11-28 NOTE — Anesthesia Postprocedure Evaluation (Signed)
Anesthesia Post Note  Patient: [name]  Procedure(s) Performed: * No procedures listed *  Anesthesia type: Epidural  Patient location: Mother/Baby  Post pain: Pain level controlled  Post assessment: Post-op Vital signs reviewed  Last Vitals:  Filed Vitals:   11/27/10 2300  BP: 103/71  Pulse: 81  Temp: 36.6 C  Resp: 18    Post vital signs: Reviewed  Level of consciousness: awake  Complications: No apparent anesthesia complications

## 2010-11-29 MED ORDER — FERROUS SULFATE 325 (65 FE) MG PO TABS: 325.0000 mg | ORAL_TABLET | Freq: Every day | ORAL | Status: DC

## 2010-11-29 MED ORDER — IBUPROFEN 600 MG PO TABS: 600.0000 mg | ORAL_TABLET | Freq: Four times a day (QID) | ORAL | Status: AC

## 2010-11-29 MED ORDER — SENNOSIDES-DOCUSATE SODIUM 8.6-50 MG PO TABS: 2.0000 | ORAL_TABLET | Freq: Every day | ORAL | Status: DC

## 2010-11-29 NOTE — ED Provider Notes (Signed)
Agree with above note.  [name] H. 11/29/2010 1:08 PM

## 2010-11-29 NOTE — Discharge Summary (Signed)
Obstetric Discharge Summary Reason for Admission: rupture of membranes Prenatal Procedures: NST Intrapartum Procedures: spontaneous vaginal delivery and GBS prophylaxis Postpartum Procedures: none Complications-Operative and Postpartum: none Hemoglobin  Date Value Range Status  11/27/2010 11.5* 12.0-15.0 (g/dL) Final     HCT  Date Value Range Status  11/27/2010 32.9* 36.0-46.0 (%) Final    Discharge Diagnoses: Term Pregnancy-delivered  30 YO G3P1021 at 38.5 weeks presented with spontaneous rupture of membranes and went on to NSVD with epidural anesthesia. Delivery was uncomplicated. Patient sustained first degree labial and perineal lacerations, superficial, not requiring repair. Patient delivered a viable female infant. Will be bottle feeding and has refused birth control. Plans outpatient circumcision.  Discharge Information: Date: 11/29/2010 Activity: pelvic rest Diet: routine Medications: Tylenol #3 and Colace Condition: stable Instructions: refer to practice specific booklet Discharge to: home   Newborn Data: Live born female  Birth Weight: 6 lb 10.8 oz (3028 g) APGAR: 8, 9  Home with mother.  Mosetta Putt 11/29/2010, 8:01 AM  I have seen patient and agree.  Meredeth Furber 11/29/10 9:36 AM

## 2010-11-29 NOTE — Progress Notes (Signed)
Patient is a 30 YO female G3P1021. Post Partum Day 2 Subjective: tolerating PO and suprapubic and lower back pain. No bowel movements, received stool softener this a.m.  Objective: Blood pressure 96/59, pulse 84, temperature 98.3 F (36.8 C), temperature source Oral, resp. rate 20, SpO2 96.00%, unknown if currently breastfeeding.  Physical Exam:  General: alert, cooperative and no distress Cardio: Regular rate & rhythm. No murmurs, rubs, gallops. Lungs: clear to auscultation, no wheezes or rhonchi Abdomen: soft, mildly tender with palpation. Lochia: appropriate Uterine Fundus: firm DVT Evaluation: No cords or calf tenderness.   Basename 11/27/10 0425  HGB 11.5*  HCT 32.9*    Assessment/Plan: Discharge home and Circumcision prior to discharge. Patient will bottle feed and declined birth control.    LOS: 2 days   Mosetta Putt 11/29/2010, 7:27 AM   I have seen and examined pt. Agree with above.  Deon Duer 11/29/10 7:59 AM

## 2010-11-29 NOTE — H&P (Signed)
Agree with above note.  [name] H. 11/29/2010 1:10 PM  

## 2011-01-02 ENCOUNTER — Ambulatory Visit: Payer: Medicare Other | Admitting: Advanced Practice Midwife

## 2011-01-02 ENCOUNTER — Ambulatory Visit: Payer: Medicare Other | Admitting: Obstetrics and Gynecology

## 2011-02-26 ENCOUNTER — Emergency Department (HOSPITAL_COMMUNITY): Payer: Medicare Other

## 2011-02-26 ENCOUNTER — Other Ambulatory Visit: Payer: Self-pay

## 2011-02-26 ENCOUNTER — Emergency Department (HOSPITAL_COMMUNITY)
Admission: EM | Admit: 2011-02-26 | Discharge: 2011-02-26 | Disposition: A | Discharge status: Home / Self Care | Payer: Medicare Other | Attending: Emergency Medicine | Admitting: Emergency Medicine

## 2011-02-26 ENCOUNTER — Encounter (HOSPITAL_COMMUNITY): Payer: Self-pay

## 2011-02-26 DIAGNOSIS — F1010 Alcohol abuse, uncomplicated: Secondary | ICD-10-CM | POA: Insufficient documentation

## 2011-02-26 DIAGNOSIS — R220 Localized swelling, mass and lump, head: Secondary | ICD-10-CM | POA: Insufficient documentation

## 2011-02-26 DIAGNOSIS — IMO0002 Reserved for concepts with insufficient information to code with codable children: Secondary | ICD-10-CM | POA: Insufficient documentation

## 2011-02-26 DIAGNOSIS — F419 Anxiety disorder, unspecified: Secondary | ICD-10-CM

## 2011-02-26 DIAGNOSIS — R221 Localized swelling, mass and lump, neck: Secondary | ICD-10-CM | POA: Insufficient documentation

## 2011-02-26 DIAGNOSIS — F411 Generalized anxiety disorder: Secondary | ICD-10-CM | POA: Insufficient documentation

## 2011-02-26 DIAGNOSIS — S0990XA Unspecified injury of head, initial encounter: Secondary | ICD-10-CM | POA: Insufficient documentation

## 2011-02-26 LAB — DIFFERENTIAL
Basophils Absolute: 0 10*3/uL (ref 0.0–0.1)
Basophils Relative: 0 % (ref 0–1)
Lymphocytes Relative: 20 % (ref 12–46)
Neutro Abs: 9.6 10*3/uL — ABNORMAL HIGH (ref 1.7–7.7)
Neutrophils Relative %: 74 % (ref 43–77)

## 2011-02-26 LAB — POCT I-STAT, CHEM 8
Calcium, Ion: 1.13 mmol/L (ref 1.12–1.32)
Creatinine, Ser: 1.1 mg/dL (ref 0.50–1.10)
Glucose, Bld: 123 mg/dL — ABNORMAL HIGH (ref 70–99)
HCT: 42 % (ref 36.0–46.0)
Hemoglobin: 14.3 g/dL (ref 12.0–15.0)
Potassium: 3.1 mEq/L — ABNORMAL LOW (ref 3.5–5.1)

## 2011-02-26 LAB — CBC
MCHC: 36.5 g/dL — ABNORMAL HIGH (ref 30.0–36.0)
Platelets: 247 10*3/uL (ref 150–400)
RDW: 13.4 % (ref 11.5–15.5)
WBC: 13 10*3/uL — ABNORMAL HIGH (ref 4.0–10.5)

## 2011-02-26 LAB — RAPID URINE DRUG SCREEN, HOSP PERFORMED
Amphetamines: NOT DETECTED
Benzodiazepines: NOT DETECTED
Opiates: NOT DETECTED

## 2011-02-26 LAB — ACETAMINOPHEN LEVEL: Acetaminophen (Tylenol), Serum: <15 ug/mL (ref 10–30)

## 2011-02-26 MED ORDER — ALPRAZOLAM 0.5 MG PO TABS: 0.5000 mg | ORAL_TABLET | Freq: Every evening | ORAL | Status: AC | PRN

## 2011-02-26 MED ORDER — ACETAMINOPHEN 325 MG PO TABS
650.0000 mg | ORAL_TABLET | ORAL | Status: DC | PRN
  Filled 2011-02-26: qty 2

## 2011-02-26 MED ORDER — ZIPRASIDONE MESYLATE 20 MG IM SOLR
INTRAMUSCULAR | Status: AC
  Administered 2011-02-26: 10 mg
  Filled 2011-02-26: qty 20

## 2011-02-26 MED ORDER — ONDANSETRON HCL 4 MG PO TABS: 4.0000 mg | ORAL_TABLET | Freq: Three times a day (TID) | ORAL | Status: DC | PRN

## 2011-02-26 MED ORDER — NICOTINE 21 MG/24HR TD PT24: 21.0000 mg | MEDICATED_PATCH | Freq: Every day | TRANSDERMAL | Status: DC

## 2011-02-26 MED ORDER — IBUPROFEN 200 MG PO TABS: 600.0000 mg | ORAL_TABLET | Freq: Three times a day (TID) | ORAL | Status: DC | PRN
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
## 2011-02-26 NOTE — ED Notes (Signed)
Mother and step-father at bedside. Mother wants daughter to get on "right medicine for nerves" states family is moving to [place] in March. Also states daughter has 3 days of community service to complete by 2/20. Wanting to take daughter home today or tomorrow.

## 2011-02-26 NOTE — ED Notes (Signed)
Pt brought in by [employer], pt is IVC by her sister d/t pt combative, intoxicated threatening to kill herself and her family.

## 2011-02-26 NOTE — Discharge Instructions (Signed)
Alcohol Problems Most adults who drink alcohol drink in moderation (not a lot) are at low risk for developing problems related to their drinking. However, all drinkers, including low-risk drinkers, should know about the health risks connected with drinking alcohol. RECOMMENDATIONS FOR LOW-RISK DRINKING  Drink in moderation. Moderate drinking is defined as follows:   Men - no more than 2 drinks per day.   Nonpregnant women - no more than 1 drink per day.   Over age 97 - no more than 1 drink per day.  A standard drink is 12 grams of pure alcohol, which is equal to a 12 ounce bottle of beer or wine cooler, a 5 ounce glass of wine, or 1.5 ounces of distilled spirits (such as whiskey, brandy, vodka, or rum).  ABSTAIN FROM (DO NOT DRINK) ALCOHOL:  When pregnant or considering pregnancy.   When taking a medication that interacts with alcohol.   If you are alcohol dependent.   A medical condition that prohibits drinking alcohol (such as ulcer, liver disease, or heart disease).  DISCUSS WITH YOUR CAREGIVER:  If you are at risk for coronary heart disease, discuss the potential benefits and risks of alcohol use: Light to moderate drinking is associated with lower rates of coronary heart disease in certain populations (for example, men over age 87 and postmenopausal women). Infrequent or nondrinkers are advised not to begin light to moderate drinking to reduce the risk of coronary heart disease so as to avoid creating an alcohol-related problem. Similar protective effects can likely be gained through proper diet and exercise.   Women and the elderly have smaller amounts of body water than men. As a result women and the elderly achieve a higher blood alcohol concentration after drinking the same amount of alcohol.   Exposing a fetus to alcohol can cause a broad range of birth defects referred to as Fetal Alcohol Syndrome (FAS) or Alcohol-Related Birth Defects (ARBD). Although FAS/ARBD is connected with  excessive alcohol consumption during pregnancy, studies also have reported neurobehavioral problems in infants born to mothers reporting drinking an average of 1 drink per day during pregnancy.   Heavier drinking (the consumption of more than 4 drinks per occasion by men and more than 3 drinks per occasion by women) impairs learning (cognitive) and psychomotor functions and increases the risk of alcohol-related problems, including accidents and injuries.  CAGE QUESTIONS:   Have you ever felt that you should Cut down on your drinking?   Have people Annoyed you by criticizing your drinking?   Have you ever felt bad or Guilty about your drinking?   Have you ever had a drink first thing in the morning to steady your nerves or get rid of a hangover (Eye opener)?  If you answered positively to any of these questions: You may be at risk for alcohol-related problems if alcohol consumption is:   Men: Greater than 14 drinks per week or more than 4 drinks per occasion.   Women: Greater than 7 drinks per week or more than 3 drinks per occasion.  Do you or your family have a medical history of alcohol-related problems, such as:  Blackouts.   Sexual dysfunction.   Depression.   Trauma.   Liver dysfunction.   Sleep disorders.   Hypertension.   Chronic abdominal pain.   Has your drinking ever caused you problems, such as problems with your family, problems with your work (or school) performance, or accidents/injuries?   Do you have a compulsion to drink or a preoccupation  with drinking?   Do you have poor control or are you unable to stop drinking once you have started?   Do you have to drink to avoid withdrawal symptoms?   Do you have problems with withdrawal such as tremors, nausea, sweats, or mood disturbances?   Does it take more alcohol than in the past to get you high?   Do you feel a strong urge to drink?   Do you change your plans so that you can have a drink?   Do you ever  drink in the morning to relieve the shakes or a hangover?  If you have answered a number of the previous questions positively, it may be time for you to talk to your caregivers, family, and friends and see if they think you have a problem. Alcoholism is a chemical dependency that keeps getting worse and will eventually destroy your health and relationships. Many alcoholics end up dead, impoverished, or in prison. This is often the end result of all chemical dependency.  Do not be discouraged if you are not ready to take action immediately.   Decisions to change behavior often involve up and down desires to change and feeling like you cannot decide.   Try to think more seriously about your drinking behavior.   Think of the reasons to quit.  WHERE TO GO FOR ADDITIONAL INFORMATION   The National Institute on Alcohol Abuse and Alcoholism (NIAAA)www.niaaa.nih.gov   [employer] on Alcoholism and Drug Dependence (NCADD)www.ncadd.org   American Society of Addiction Medicine (ASAM)www.https://anderson-johnson.com/  Document Released: 12/31/2004 Document Revised: 09/12/2010 Document Reviewed: 08/19/2007 [facility] Patient Information 2012 [place], [place].     Anxiety and Panic Attacks Your caregiver has informed you that you are having an anxiety or panic attack. There may be many forms of this. Most of the time these attacks come suddenly and without warning. They come at any time of day, including periods of sleep, and at any time of life. They may be strong and unexplained. Although panic attacks are very scary, they are physically harmless. Sometimes the cause of your anxiety is not known. Anxiety is a protective mechanism of the body in its fight or flight mechanism. Most of these perceived danger situations are actually nonphysical situations (such as anxiety over losing a job). CAUSES  The causes of an anxiety or panic attack are many. Panic attacks may occur in otherwise healthy people given a certain set of  circumstances. There may be a genetic cause for panic attacks. Some medications may also have anxiety as a side effect. SYMPTOMS  Some of the most common feelings are:  Intense terror.   Dizziness, feeling faint.   Hot and cold flashes.   Fear of going crazy.   Feelings that nothing is real.   Sweating.   Shaking.   Chest pain or a fast heartbeat (palpitations).   Smothering, choking sensations.   Feelings of impending doom and that death is near.   Tingling of extremities, this may be from over-breathing.   Altered reality (derealization).   Being detached from yourself (depersonalization).  Several symptoms can be present to make up anxiety or panic attacks. DIAGNOSIS  The evaluation by your caregiver will depend on the type of symptoms you are experiencing. The diagnosis of anxiety or panic attack is made when no physical illness can be determined to be a cause of the symptoms. TREATMENT  Treatment to prevent anxiety and panic attacks may include:  Avoidance of circumstances that cause anxiety.  Reassurance and relaxation.   Regular exercise.   Relaxation therapies, such as yoga.   Psychotherapy with a psychiatrist or therapist.   Avoidance of caffeine, alcohol and illegal drugs.   Prescribed medication.  SEEK IMMEDIATE MEDICAL CARE IF:   You experience panic attack symptoms that are different than your usual symptoms.   You have any worsening or concerning symptoms.  Document Released: 12/31/2004 Document Revised: 09/12/2010 Document Reviewed: 05/04/2009 [facility] Patient Information 2012 [place], [place].    RESOURCE GUIDE  Dental Problems  Patients with Medicaid: [facility] [phone] [address]                                           [phone] [address] Phone:  [phone]                                                  Phone:  [phone]  If unable to pay or uninsured, contact:  [facility] or  [facility] to become qualified for the adult dental clinic.  Chronic Pain Problems Contact [facility]  [phone] Patients need to be referred by their primary care doctor.  Insufficient Money for Medicine Contact [employer] Way:  call "211" or [facility] [phone] [phone].  No Primary Care Doctor Call [facility]  313-784-4221 Other agencies that provide inexpensive medical care    [facility]  [phone]    [facility]  [phone]    [facility]  [phone]    [facility]  [phone]    [facility]  [phone]    [facility]  [phone]  Psychological Services [facility] Behavior[facility]  [phone] [facility]  [phone] [facility]   [phone] (emergency services [phone])  Substance Abuse Resources [facility]  ([phone] [facility] [phone] [facility] [place] [phone] Floydene Flock [phone] Residential & Outpatient Substance Abuse Program  [phone]  Abuse/Neglect [facility] Child Abuse Hotline (306)250-3058 [facility] Child Abuse Hotline 2706291490 (After Hours)  Emergency Shelter [facility] [phone])[phone]  Maternity Homes [facility] [place] [facility] 661-320-7022 [facility] [phone]  MRSA Hotline #:   2816535530    [facility] Resources  [facility]     United [employer]                          [facility] Dept. [address] [place]                       [address]      [address]  Patrecia Pace  [facility] Phone:  [phone]                                   Phone:  [phone]                 Phone:  [phone]  [facility] Phone:  [phone]  [facility] Child Abuse Hotline 801-022-1037 [phone] (After Hours)
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
## 2011-02-26 NOTE — Progress Notes (Signed)
CSW assessed patient. Pending telepsych to determine disposition.  Ileene Hutchinson , MSW, LCSWA 02/26/2011 1:08 PM [phone]

## 2011-02-26 NOTE — ED Notes (Signed)
EKG given to EDP, Palumbo.

## 2011-02-26 NOTE — ED Notes (Signed)
EDP, Palumbo notified of elevated heart rate. EDP, Palumbo at bedside.

## 2011-02-26 NOTE — ED Provider Notes (Addendum)
2:47 PM  Extensive discussion in the room with the patient. At this point in time. She is much more calm and cooperative. No longer intoxicated. Patient states that she does not remember making any threatening remarks about herself or her family    She has no thoughts of hurting herself or anyone else. I also, was able to call her mother, Britta Mccreedy from in the room.    Her mom also feels comfortable taking patient home and will come and pick up Misty Stanley from ED.  Telepsych did recommend Geodon, but we'll prescribe Xanax as this is what the patient was on before.   Initially Telepsych recommended inpatient treatment. However, this was only because the patient has a baby at home. However, the mom states that she will take care of the baby [place] has no thoughts of hurting herself or anyone else. The psychiatrist commented that the patient was a good mother and was calm and had no suicidal plans or intents. No delusional thought process.   Leta Jungling the social worker states that she can get her in with a therapist  and a walk in clinic as soon as tomorrow.    Patient will be stable to be discharged home. She was told to return to the ED for any concerns or changing symptoms. Otherwise, appears to be reasonable. States she is moving back to [place] sometime soon. His chemical bowl, conversant, in no distress and again, adamantly denies any thoughts of hurting herself or others  Jaqueline Uber A. Patrica Duel, MD 02/26/11 1449  Theron Arista A. Patrica Duel, MD 02/26/11 1456

## 2011-02-26 NOTE — ED Notes (Signed)
Pt woke up requesting something to drink, a coke given, pt cooperative at this time.

## 2011-02-26 NOTE — ED Notes (Signed)
[name] -- mother-- [phone]. Requests to talk to psyc.

## 2011-09-03 ENCOUNTER — Emergency Department (HOSPITAL_COMMUNITY): Payer: Medicare Other

## 2011-09-03 ENCOUNTER — Encounter (HOSPITAL_COMMUNITY): Payer: Self-pay | Admitting: *Deleted

## 2011-09-03 ENCOUNTER — Emergency Department (EMERGENCY_DEPARTMENT_HOSPITAL)
Admission: EM | Admit: 2011-09-03 | Discharge: 2011-09-05 | Disposition: A | Discharge status: Other Acute Inpatient Hospital | Payer: Medicare Other | Source: Home / Self Care | Attending: Emergency Medicine | Admitting: Emergency Medicine

## 2011-09-03 ENCOUNTER — Other Ambulatory Visit: Payer: Self-pay

## 2011-09-03 DIAGNOSIS — R4689 Other symptoms and signs involving appearance and behavior: Secondary | ICD-10-CM

## 2011-09-03 DIAGNOSIS — X789XXA Intentional self-harm by unspecified sharp object, initial encounter: Secondary | ICD-10-CM | POA: Insufficient documentation

## 2011-09-03 DIAGNOSIS — R4182 Altered mental status, unspecified: Secondary | ICD-10-CM | POA: Insufficient documentation

## 2011-09-03 DIAGNOSIS — F319 Bipolar disorder, unspecified: Secondary | ICD-10-CM | POA: Insufficient documentation

## 2011-09-03 DIAGNOSIS — F603 Borderline personality disorder: Secondary | ICD-10-CM | POA: Insufficient documentation

## 2011-09-03 DIAGNOSIS — F17200 Nicotine dependence, unspecified, uncomplicated: Secondary | ICD-10-CM | POA: Insufficient documentation

## 2011-09-03 DIAGNOSIS — Z8659 Personal history of other mental and behavioral disorders: Secondary | ICD-10-CM | POA: Insufficient documentation

## 2011-09-03 DIAGNOSIS — IMO0002 Reserved for concepts with insufficient information to code with codable children: Secondary | ICD-10-CM | POA: Insufficient documentation

## 2011-09-03 DIAGNOSIS — Y92009 Unspecified place in unspecified non-institutional (private) residence as the place of occurrence of the external cause: Secondary | ICD-10-CM | POA: Insufficient documentation

## 2011-09-03 DIAGNOSIS — S61409A Unspecified open wound of unspecified hand, initial encounter: Secondary | ICD-10-CM | POA: Insufficient documentation

## 2011-09-03 LAB — CBC
Hemoglobin: 14.6 g/dL (ref 12.0–15.0)
MCH: 31.2 pg (ref 26.0–34.0)
MCHC: 36.1 g/dL — ABNORMAL HIGH (ref 30.0–36.0)
MCV: 86.3 fL (ref 78.0–100.0)
Platelets: 327 10*3/uL (ref 150–400)
RBC: 4.68 MIL/uL (ref 3.87–5.11)

## 2011-09-03 LAB — URINALYSIS, ROUTINE W REFLEX MICROSCOPIC
Leukocytes, UA: NEGATIVE
Nitrite: NEGATIVE
Protein, ur: NEGATIVE mg/dL
Specific Gravity, Urine: 1.014 (ref 1.005–1.030)
Urobilinogen, UA: 0.2 mg/dL (ref 0.0–1.0)

## 2011-09-03 LAB — POCT PREGNANCY, URINE: Preg Test, Ur: NEGATIVE

## 2011-09-03 LAB — RAPID URINE DRUG SCREEN, HOSP PERFORMED
Amphetamines: NOT DETECTED
Tetrahydrocannabinol: POSITIVE — AB

## 2011-09-03 LAB — BASIC METABOLIC PANEL
CO2: 20 mEq/L (ref 19–32)
Calcium: 9.2 mg/dL (ref 8.4–10.5)
Creatinine, Ser: 1.22 mg/dL — ABNORMAL HIGH (ref 0.50–1.10)
GFR calc non Af Amer: 58 mL/min — ABNORMAL LOW (ref >90)
Glucose, Bld: 117 mg/dL — ABNORMAL HIGH (ref 70–99)

## 2011-09-03 MED ORDER — SODIUM CHLORIDE 0.9 % IV SOLN
1000.0000 mL | Freq: Once | INTRAVENOUS | Status: AC
  Administered 2011-09-03: 1000 mL via INTRAVENOUS

## 2011-09-03 MED ORDER — LORAZEPAM 2 MG/ML IJ SOLN
2.0000 mg | Freq: Once | INTRAMUSCULAR | Status: AC
  Administered 2011-09-03: 2 mg via INTRAMUSCULAR
  Filled 2011-09-03: qty 1

## 2011-09-03 MED ORDER — TETANUS-DIPHTH-ACELL PERTUSSIS 5-2.5-18.5 LF-MCG/0.5 IM SUSP
0.5000 mL | Freq: Once | INTRAMUSCULAR | Status: AC
  Administered 2011-09-03: 0.5 mL via INTRAMUSCULAR
  Filled 2011-09-03: qty 0.5

## 2011-09-03 MED ORDER — LORAZEPAM 1 MG PO TABS
1.0000 mg | ORAL_TABLET | Freq: Four times a day (QID) | ORAL | Status: DC | PRN
  Administered 2011-09-04 (×2): 1 mg via ORAL
  Filled 2011-09-03 (×2): qty 1

## 2011-09-03 MED ORDER — ZIPRASIDONE MESYLATE 20 MG IM SOLR: 10.0000 mg | Freq: Four times a day (QID) | INTRAMUSCULAR | Status: DC | PRN

## 2011-09-03 MED ORDER — ZIPRASIDONE MESYLATE 20 MG IM SOLR
20.0000 mg | Freq: Once | INTRAMUSCULAR | Status: AC
  Administered 2011-09-03: 20 mg via INTRAMUSCULAR
  Filled 2011-09-03: qty 20

## 2011-09-03 MED ORDER — SODIUM CHLORIDE 0.9 % IV SOLN
1000.0000 mL | INTRAVENOUS | Status: DC
  Administered 2011-09-04: 1000 mL via INTRAVENOUS

## 2011-09-03 MED ORDER — IBUPROFEN 200 MG PO TABS
600.0000 mg | ORAL_TABLET | Freq: Three times a day (TID) | ORAL | Status: DC | PRN
  Administered 2011-09-04 (×2): 600 mg via ORAL
  Filled 2011-09-03 (×3): qty 3

## 2011-09-03 MED ORDER — ONDANSETRON HCL 4 MG PO TABS: 4.0000 mg | ORAL_TABLET | Freq: Three times a day (TID) | ORAL | Status: DC | PRN

## 2011-09-03 MED ORDER — SODIUM CHLORIDE 0.9 % IV SOLN
1000.0000 mL | Freq: Once | INTRAVENOUS | Status: AC
  Administered 2011-09-04: 1000 mL via INTRAVENOUS
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
## 2011-09-03 NOTE — ED Notes (Signed)
Pt continues to run tachy at 130-140.  EDP Roselyn Bering notified.
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
## 2011-09-04 DIAGNOSIS — F1010 Alcohol abuse, uncomplicated: Secondary | ICD-10-CM

## 2011-09-04 DIAGNOSIS — F1210 Cannabis abuse, uncomplicated: Secondary | ICD-10-CM

## 2011-09-04 DIAGNOSIS — F3160 Bipolar disorder, current episode mixed, unspecified: Secondary | ICD-10-CM

## 2011-09-04 LAB — CBC
HCT: 31.2 % — ABNORMAL LOW (ref 36.0–46.0)
Hemoglobin: 11.3 g/dL — ABNORMAL LOW (ref 12.0–15.0)
MCV: 86.9 fL (ref 78.0–100.0)
RBC: 3.59 MIL/uL — ABNORMAL LOW (ref 3.87–5.11)
WBC: 15 10*3/uL — ABNORMAL HIGH (ref 4.0–10.5)

## 2011-09-04 MED ORDER — CLONAZEPAM 0.5 MG PO TABS
0.5000 mg | ORAL_TABLET | Freq: Two times a day (BID) | ORAL | Status: DC
  Administered 2011-09-04: 0.5 mg via ORAL
  Filled 2011-09-04: qty 1

## 2011-09-04 MED ORDER — LORAZEPAM 1 MG PO TABS
1.0000 mg | ORAL_TABLET | ORAL | Status: DC | PRN
  Administered 2011-09-05: 1 mg via ORAL
  Filled 2011-09-04: qty 1

## 2011-09-04 MED ORDER — QUETIAPINE FUMARATE 100 MG PO TABS
200.0000 mg | ORAL_TABLET | Freq: Every day | ORAL | Status: DC
  Administered 2011-09-04: 200 mg via ORAL
  Filled 2011-09-04: qty 2

## 2011-09-04 MED ORDER — ALUM & MAG HYDROXIDE-SIMETH 200-200-20 MG/5ML PO SUSP
30.0000 mL | Freq: Once | ORAL | Status: AC
  Administered 2011-09-04: 30 mL via ORAL
  Filled 2011-09-04: qty 30

## 2011-09-04 NOTE — ED Notes (Signed)
Report to [place] Rn pt to tcu

## 2011-09-04 NOTE — ED Notes (Addendum)
Mother tried to visited told by AD of the ED that she made self 3x and no information is to given her and vistors
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
## 2011-09-04 NOTE — ED Notes (Signed)
Pt belongings locked in locker 27 

## 2011-09-04 NOTE — ED Notes (Signed)
Pt remain calm.Resting
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
## 2011-09-04 NOTE — ED Provider Notes (Addendum)
Filed Vitals:   09/04/11 0744  BP: 111/74  Pulse: 95  Temp: 98.5 F (36.9 C)  Resp: 18   Patient evaluated by myself. Overnight patient apparently did have repair of her lacerations. Of note patient arrived with IVC paperwork completed by her mother. Patient with white count of 26 last night. Presumed to be a demargination. No infectious source found. I agree with this assessment. CBC repeated with improvement to 15 consistent with demargination. Tachycardia and agitation resolved. Alcohol level undetectable at this time. No history of alcoholism and alcohol detox protocol not ordered given stabilized vital signs at this time. Discussed with the behavioral health team. Awaiting assessment.  2:48 PM Patient has been assessed by ACT.  Continues to await placement.  Cyndra Numbers, MD 09/04/11 [id number]  Cyndra Numbers, MD 09/04/11 [phone]
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
## 2011-09-04 NOTE — ED Notes (Addendum)
Pt here from room 13 calm and cooperative oriented to room  Calls and visitors. Watching TV no sitter at bedside charge aware

## 2011-09-04 NOTE — ED Notes (Signed)
Pt's mother is Siena Poehler [phone]

## 2011-09-04 NOTE — ED Notes (Signed)
Pt resting in bed quietly at this time. Pt with no sitter at bedside charge Rn aware.

## 2011-09-05 ENCOUNTER — Encounter (HOSPITAL_COMMUNITY): Payer: Self-pay | Admitting: *Deleted

## 2011-09-05 ENCOUNTER — Inpatient Hospital Stay (HOSPITAL_COMMUNITY)
Admission: EM | Admit: 2011-09-05 | Discharge: 2011-09-09 | DRG: 885 | Disposition: A | Discharge status: Home / Self Care | Payer: Medicare Other | Source: Ambulatory Visit | Attending: Psychiatry | Admitting: Psychiatry

## 2011-09-05 DIAGNOSIS — S61409A Unspecified open wound of unspecified hand, initial encounter: Secondary | ICD-10-CM | POA: Diagnosis present

## 2011-09-05 DIAGNOSIS — F411 Generalized anxiety disorder: Secondary | ICD-10-CM

## 2011-09-05 DIAGNOSIS — Y92009 Unspecified place in unspecified non-institutional (private) residence as the place of occurrence of the external cause: Secondary | ICD-10-CM

## 2011-09-05 DIAGNOSIS — F39 Unspecified mood [affective] disorder: Principal | ICD-10-CM | POA: Diagnosis present

## 2011-09-05 DIAGNOSIS — Y998 Other external cause status: Secondary | ICD-10-CM

## 2011-09-05 DIAGNOSIS — W268XXA Contact with other sharp object(s), not elsewhere classified, initial encounter: Secondary | ICD-10-CM | POA: Diagnosis present

## 2011-09-05 DIAGNOSIS — S41109A Unspecified open wound of unspecified upper arm, initial encounter: Secondary | ICD-10-CM | POA: Diagnosis present

## 2011-09-05 DIAGNOSIS — F1910 Other psychoactive substance abuse, uncomplicated: Secondary | ICD-10-CM

## 2011-09-05 DIAGNOSIS — F1010 Alcohol abuse, uncomplicated: Secondary | ICD-10-CM | POA: Diagnosis present

## 2011-09-05 DIAGNOSIS — F1210 Cannabis abuse, uncomplicated: Secondary | ICD-10-CM | POA: Diagnosis present

## 2011-09-05 HISTORY — DX: Fibromyalgia: M79.7

## 2011-09-05 MED ORDER — HYDROXYZINE HCL 50 MG PO TABS
50.0000 mg | ORAL_TABLET | Freq: Four times a day (QID) | ORAL | Status: DC | PRN
  Administered 2011-09-05 – 2011-09-09 (×9): 50 mg via ORAL
  Filled 2011-09-05 (×3): qty 1

## 2011-09-05 MED ORDER — DIVALPROEX SODIUM 250 MG PO DR TAB
250.0000 mg | DELAYED_RELEASE_TABLET | Freq: Two times a day (BID) | ORAL | Status: DC
  Administered 2011-09-05 – 2011-09-09 (×8): 250 mg via ORAL
  Filled 2011-09-05 (×12): qty 1

## 2011-09-05 MED ORDER — ACETAMINOPHEN 325 MG PO TABS
650.0000 mg | ORAL_TABLET | Freq: Four times a day (QID) | ORAL | Status: DC | PRN
  Administered 2011-09-05 – 2011-09-08 (×4): 650 mg via ORAL
  Filled 2011-09-05: qty 2

## 2011-09-05 MED ORDER — QUETIAPINE FUMARATE 50 MG PO TABS
50.0000 mg | ORAL_TABLET | Freq: Three times a day (TID) | ORAL | Status: DC
  Administered 2011-09-05: 50 mg via ORAL
  Filled 2011-09-05 (×4): qty 1

## 2011-09-05 MED ORDER — MAGNESIUM HYDROXIDE 400 MG/5ML PO SUSP: 30.0000 mL | Freq: Every day | ORAL | Status: DC | PRN

## 2011-09-05 MED ORDER — GABAPENTIN 600 MG PO TABS
300.0000 mg | ORAL_TABLET | Freq: Two times a day (BID) | ORAL | Status: DC
  Filled 2011-09-05 (×2): qty 0.5

## 2011-09-05 MED ORDER — ALUM & MAG HYDROXIDE-SIMETH 200-200-20 MG/5ML PO SUSP: 30.0000 mL | ORAL | Status: DC | PRN

## 2011-09-05 MED ORDER — GABAPENTIN 300 MG PO CAPS
300.0000 mg | ORAL_CAPSULE | Freq: Two times a day (BID) | ORAL | Status: DC
  Administered 2011-09-05 – 2011-09-06 (×3): 300 mg via ORAL
  Filled 2011-09-05 (×6): qty 1

## 2011-09-05 MED ORDER — NICOTINE 21 MG/24HR TD PT24
21.0000 mg | MEDICATED_PATCH | Freq: Every day | TRANSDERMAL | Status: DC
  Administered 2011-09-05 – 2011-09-06 (×2): 21 mg via TRANSDERMAL
  Filled 2011-09-05 (×5): qty 1

## 2011-09-05 MED ORDER — QUETIAPINE FUMARATE 100 MG PO TABS
100.0000 mg | ORAL_TABLET | Freq: Three times a day (TID) | ORAL | Status: DC
  Administered 2011-09-05 – 2011-09-09 (×13): 100 mg via ORAL
  Filled 2011-09-05 (×17): qty 1

## 2011-09-05 MED ORDER — VALPROIC ACID 250 MG PO CAPS: 250.0000 mg | ORAL_CAPSULE | Freq: Two times a day (BID) | ORAL | Status: DC

## 2011-09-05 NOTE — Tx Team (Signed)
Initial Interdisciplinary Treatment Plan  PATIENT STRENGTHS: (choose at least two) Active sense of humor Average or above average intelligence Capable of independent living Communication skills General fund of knowledge Physical Health Supportive family/friends  PATIENT STRESSORS: Marital or family conflict Medication change or noncompliance Substance abuse   PROBLEM LIST: Problem List/Patient Goals Date to be addressed Date deferred Reason deferred Estimated date of resolution  "Getting my medication right" 09/05/11     Exersing 09/05/11           Increased risk for suicide 09/05/11     Substance abuse 08/27/11                              DISCHARGE CRITERIA:  Ability to meet basic life and health needs Adequate post-discharge living arrangements Improved stabilization in mood, thinking, and/or behavior Medical problems require only outpatient monitoring Motivation to continue treatment in a less acute level of care Need for constant or close observation no longer present Reduction of life-threatening or endangering symptoms to within safe limits Safe-care adequate arrangements made Verbal commitment to aftercare and medication compliance Withdrawal symptoms are absent or subacute and managed without 24-hour nursing intervention  PRELIMINARY DISCHARGE PLAN: Attend 12-step recovery group Outpatient therapy Participate in family therapy Return to previous living arrangement  PATIENT/FAMIILY INVOLVEMENT: This treatment plan has been presented to and reviewed with the patient, [name], and/or family member.  The patient and family have been given the opportunity to ask questions and make suggestions.  [name] 09/05/2011, 4:25 AM
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
## 2011-09-05 NOTE — Progress Notes (Signed)
09/05/2011         Time: 1500      Group Topic/Focus: The focus of this group is on discussing the importance of supports, what constitutes supports and reviewing/listing supports available in their communities.  Participation Level: Active  Participation Quality: Attentive  Affect: Irritable  Cognitive: Oriented   Additional Comments: Patient irritable, reports she needs a cigarette, was short with staff and peers on occasion but participated in the activity.  Liliana Dang 09/05/2011 3:39 PM

## 2011-09-05 NOTE — Progress Notes (Signed)
Patient out in the hallway and very hysterical and crying and voiced that she is upset about seeing her father and child;patient breathing very rapidly and becoming more anxious and irritable  Patient was encouraged to focus on her breathing and to take slow deep breaths at this time; emotional support offered as patient was also encouraged to talk through the situation once the breathing exercise started to help; medication administration/physician orders  Patient anxiety started to decrease; patient's respirations were no longer rapid at this time and patient is responding to the vistaril ordered for her
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
## 2011-09-05 NOTE — Progress Notes (Signed)
D:  Patient stayed in bed this morning as she got very little sleep last night.  Tearful on awakening.  States she does not understand why she has to be here.  Minimizes incidents of last night.  Did state that she felt extremely anxious.  Patient met with [name], [place], who ordered her some quetiapine.  Medication was started at noon.   A:  Listened to patient and allowed her to vent her frustrations.  Explained that some of her behaviors last night would be considered as a potential harm to herself or others.  Encouraged her to discuss her feelings further with Dr. Koren Shiver.  I also spoke with the doctor to let her know that the patient really wanted to speak with her.  Medications started as ordered.  Patient was educated on what the medication was for.  R:  Verbalized understanding of the medicine and states she has been on it in the past.  Asked if she was going to be able to get Klonopin.  Accepted the medications given and agreed to speak further with MD.
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
## 2011-09-05 NOTE — H&P (Signed)
Pt seen and evaluated upon admission.  Completed Admission Suicide Risk Assessment.  See orders.  Pt agreeable with plan.  Discussed with team.   

## 2011-09-05 NOTE — ED Notes (Signed)
Patient discharge via ambulatory with a  Steady gait. Respirations equal and unlabored. Skin warm and unlabored. Skin warm and dry. No acute distress noted.

## 2011-09-05 NOTE — Progress Notes (Signed)
[facility] Group Notes:  (Counselor/Nursing/MHT/Case Management/Adjunct)  09/05/2011 3:11 PM  Type of Therapy:  Group Therapy  Participation Level:  Did Not Attend  Clide Dales 09/05/2011, 3:11 PM

## 2011-09-05 NOTE — [facility] Counselor (Signed)
Pt accepted to [facility] to Dr Koren Shiver rm [phone]. EDP notified and agrees with plan.

## 2011-09-06 MED ORDER — NICOTINE POLACRILEX 2 MG MT GUM
2.0000 mg | CHEWING_GUM | OROMUCOSAL | Status: DC | PRN
  Administered 2011-09-06 – 2011-09-07 (×5): 2 mg via ORAL

## 2011-09-06 MED ORDER — GABAPENTIN 300 MG PO CAPS
300.0000 mg | ORAL_CAPSULE | Freq: Three times a day (TID) | ORAL | Status: DC
  Administered 2011-09-07 – 2011-09-09 (×10): 300 mg via ORAL
  Filled 2011-09-06 (×15): qty 1

## 2011-09-06 NOTE — Progress Notes (Signed)
Nutrition Brief Note  Patient identified on the Malnutrition Screening Tool (MST) report for unintentional weight loss, generating a score of 2.   Body mass index is 30.55 kg/(m^2). Pt meets criteria for Obesity class 1 based on current BMI.   Current diet order is regular, patient is consuming approximately 100% of meals at this time. Labs and medications reviewed. Patient reported her appetite and intake have been well. She reported she had a child back in November and has been trying to loose some weight. She does not breast feed. Patient is without any nutrition related questions or concerns.   No nutrition interventions warranted at this time. If nutrition issues arise, please consult RD.   [name] [facility] [phone]

## 2011-09-06 NOTE — Progress Notes (Signed)
Psychoeducational Group Note  Date:  09/06/2011 Time: 2000  Group Topic/Focus:  Karaoke night    Participation Level:  Active  Participation Quality:  Appropriate  Affect:  Appropriate  Cognitive:  Appropriate  Insight:  Good  Engagement in Group:  Good  Additional Comments:  Pt attended karaoke and participated in group.     Chae Oommen A 09/06/2011, 12:41 AM

## 2011-09-06 NOTE — Progress Notes (Signed)
[facility] Group Notes:  (Counselor/Nursing/MHT/Case Management/Adjunct)  09/06/2011 1:56 PM  Type of Therapy:  Psychoeducational Skills  Participation Level:  Minimal  Participation Quality:  Appropriate and Drowsy  Affect:  Appropriate and Flat  Cognitive:  Appropriate  Insight:  pt was only in group for a short time  Engagement in Group:  Limited  Engagement in Therapy:  Limited  Modes of Intervention:  Activity, Education and Socialization  Summary of Progress/Problems:Pt attended group where the group played Electrical engineer. Pt was only in group for a short amount of time, but did participate for that time.    Dalia Heading 09/06/2011, 1:56 PM

## 2011-09-06 NOTE — Progress Notes (Signed)
Psychoeducational Group Note  Date:  09/06/2011 Time:  1000  Group Topic/Focus:  Relapse Prevention Planning:   The focus of this group is to define relapse and discuss the need for planning to combat relapse.  Participation Level:  Did Not Attend  Participation Quality:    Affect:    Cognitive:    Insight:    Engagement in Group:    Additional Comments:  Pt was sleeping  [name] M 09/06/2011, 10:57 AM

## 2011-09-06 NOTE — Progress Notes (Signed)
D:  Pt about on the unit today.  She says she feels better today.  Slept well last night.  Says that her appetite is good and that her ability to pay attention is improving.  Denies SI/HI.  Said she got upset yesterday about her 29 month old baby.  She did not attend am group.  She said that she has to get herself back on track to take care of her child.  A:  Offered support and encouragement.  Given meds as prescribed and prn for pain and anxiety.  R:  Pt. Receptive to staff.  Remains on q 15 minute checks for safety.  Will continue to monitor.

## 2011-09-06 NOTE — Progress Notes (Signed)
[facility] MD Progress Note  09/06/2011 10:58 PM  S/O:  Patient seen and evaluated. Chart reviewed. Patient stated that her mood was "better".  Slept "good".  Hx sig agitation and aggression prior to admission while intoxicated. Her affect was mood congruent and brighter. She denied any current thoughts of self injurious behavior, suicidal ideation or homicidal ideation. There were no auditory or visual hallucinations, paranoia, delusional thought processes, or mania noted. Thought process was linear and goal directed. Speech was normal rate, tone and volume. Eye contact was good. Judgment and insight are limited. Patient has been up and engaged on the unit. No acute safety concerns reported from team.   Sleep:  Number of Hours: 6.5    Vital Signs:Blood pressure 126/76, pulse 85, temperature 97.8 F (36.6 C), temperature source Oral, resp. rate 18, height 5\' 4"  (1.626 m), weight 80.74 kg (178 lb).  Current Medications:   . divalproex  250 mg Oral Q12H  . gabapentin  300 mg Oral BID  . QUEtiapine  100 mg Oral TID  . DISCONTD: nicotine  21 mg Transdermal Q0600    Lab Results:  Results for orders placed during the hospital encounter of 09/05/11 (from the past 48 hour(s))  TSH     Status: Normal   Collection Time   09/05/11  6:15 AM      Component Value Range Comment   TSH 2.269  0.350 - 4.500 uIU/mL   T4, FREE     Status: Normal   Collection Time   09/05/11  6:15 AM      Component Value Range Comment   Free T4 1.25  0.80 - 1.80 ng/dL     Physical Findings: AIMS: Facial and Oral Movements Muscles of Facial Expression: None, normal Lips and Perioral Area: None, normal Jaw: None, normal Tongue: None, normal,Extremity Movements Upper (arms, wrists, hands, fingers): None, normal Lower (legs, knees, ankles, toes): None, normal, Trunk Movements Neck, shoulders, hips: None, normal, Overall Severity Severity of abnormal movements (highest score from questions above): None, normal Incapacitation  due to abnormal movements: None, normal Patient's awareness of abnormal movements (rate only patient's report): No Awareness, Dental Status Current problems with teeth and/or dentures?: No Does patient usually wear dentures?: No  CIWA:  CIWA-Ar Total: 2   A/P: Alcohol Use Disorder; Cannabis Use Disorder, mild; Mood Disorder Unspecified; BPAD vs. Schizoaffective Disorder per past Hx   Pt seen and evaluated in treatment team.  Reviewed short term and long term goals, medications, current treatment in the hospital and acute/chronic safety.  Pt denied any current thoughts of self harm, suicidal ideation or homicidal ideation.  Contracted for safety on the unit.  No acute issues noted.  VS reviewed with team.  Pt agreeable with treatment plan, see orders. Continue current medications as noted above with increase in Neurontin to tid for better control of pain and anxiety.    Lupe Carney 09/06/2011, 10:58 PM

## 2011-09-06 NOTE — Progress Notes (Addendum)
D:  Pt very angry and crying states "I am about to flip out, give me some medicine to calm me down".  After talking with pt, she explained that her mother was supposed to bring her son to come and visit her but she did not show up.   A:  Verbally de-escalated pt, support given, encouraged pt to utilize coping skills and anger management skills, ordered gave nicotine gum per protocol.  D/C'd nicotine patch per protocol.  Pt made aware that the patch is discontinued and nicotine gum will replace it.  R:  Pt receptive to de-escalation, calm and cooperative at this time.  Pt prefers nicotine gum over the patch.

## 2011-09-06 NOTE — Progress Notes (Signed)
[facility] Group Notes:  (Counselor/Nursing/MHT/Case Management/Adjunct)  09/06/2011 5:29 PM  Type of Therapy:  Group Therapy  Participation Level:  Did Not Attend other than last 10 minutes of group  Participation Quality:  Drowsy  Affect:  Flat  Cognitive:  Oriented  Insight:  Limited  Engagement in Group:  Limited  Engagement in Therapy:  Limited  Modes of Intervention:  Limit-setting and Socialization  Summary of Progress/Problems:  [name] came in towards end of group yet was given opportunity to share with group what brought her in and what she is willing and ready to work on upon discharge.  Limit setting was needed at two separate points for pt to cover her chest area with jacket.    Clide Dales 09/06/2011, 5:32 PM

## 2011-09-07 NOTE — Progress Notes (Signed)
  [name] is a 31 y.o. female [id number] 04/09/1980  09/05/2011 Active Problems:  * No active hospital problems. *    Mental Status: Alert and fully oriented. [name] is calmer denies Si/HI/    Subjective/Objective: Lying down in room- meds kicked in . Feels calmer and can handle things better now.    Filed Vitals:   09/07/11 0701  BP: 109/72  Pulse: 101  Temp:   Resp:     Lab Results:   BMET    Component Value Date/Time   NA 138 09/03/2011 2115   K 3.9 09/03/2011 2115   CL 104 09/03/2011 2115   CO2 20 09/03/2011 2115   GLUCOSE 117* 09/03/2011 2115   BUN 13 09/03/2011 2115   CREATININE 1.22* 09/03/2011 2115   CALCIUM 9.2 09/03/2011 2115   GFRNONAA 58* 09/03/2011 2115   GFRAA 67* 09/03/2011 2115    Medications:  Scheduled:     . divalproex  250 mg Oral Q12H  . gabapentin  300 mg Oral TID  . QUEtiapine  100 mg Oral TID  . DISCONTD: gabapentin  300 mg Oral BID  . DISCONTD: nicotine  21 mg Transdermal Q0600     PRN Meds acetaminophen, alum & mag hydroxide-simeth, hydrOXYzine, magnesium hydroxide, nicotine polacrilex Plan continue current plan of care.  Adamary Savary,MICKIE D. 09/07/2011

## 2011-09-07 NOTE — Progress Notes (Signed)
Psychoeducational Group Note  Date:  09/07/2011 Time:  1015  Group Topic/Focus:  Identifying Needs:   The focus of this group is to help patients identify their personal needs that have been historically problematic and identify healthy behaviors to address their needs.  Participation Level:  Did Not Attend  Participation Quality:  did not attend  Affect:  Depressed  Cognitive:  Alert  Insight:  None  Engagement in Group:  None  Additional Comments:    Cresenciano Lick 09/07/2011, 1:57 PM

## 2011-09-07 NOTE — Progress Notes (Signed)
Psychoeducational Group Note  Date:  09/06/2011 Time:  2000  Group Topic/Focus:  AA group  Participation Level:  Active  Participation Quality:  Appropriate  Affect:  Appropriate  Cognitive:  Alert  Insight:  Good  Engagement in Group:  Good  Additional Comments:    Bereket Gernert R 09/07/2011, 12:47 AM

## 2011-09-07 NOTE — Progress Notes (Addendum)
[facility] Adult Inpatient Family/Significant Other Suicide Prevention Education  Suicide Prevention Education:  Education Completed; Britta Mccreedy barrows (mother) [phone]  has been identified by the patient as the family member/significant other with whom the patient will be residing, and identified as the person(s) who will aid the patient in the event of a mental health crisis (suicidal ideations/suicide attempt).  With written consent from the patient, the family member/significant other has been provided the following suicide prevention education, prior to the and/or following the discharge of the patient.  The suicide prevention education provided includes the following:  Suicide risk factors  Suicide prevention and interventions  National Suicide Hotline telephone number  [facility] assessment telephone number  [facility] Emergency Assistance 911  [facility] and/or Residential Mobile Crisis Unit telephone number  Request made of family/significant other to:  Remove weapons (e.g., guns, rifles, knives), all items previously/currently identified as safety concern.    Remove drugs/medications (over-the-counter, prescriptions, illicit drugs), all items previously/currently identified as a safety concern.  The family member/significant other verbalizes understanding of the suicide prevention education information provided.  The family member/significant other agrees to remove the items of safety concern listed above.  Mother would like to see the pt be D/C on Monday for the pt will not have transportation latter in week. Mother wants to have the pt stay on, reporting that the outburst at her home was a result of the pt not being on her medications. Mother would also like to see the pt attend anger mgt support groups. Pt was given a listing of local support groups. Mother can transport the pt home Monday and is not afraid of her or has additional  concerns.  [facility] 09/08/2011, 3:33 PM

## 2011-09-07 NOTE — Progress Notes (Signed)
Psychoeducational Group Note  Date:  09/07/2011 Time:  1515  Group Topic/Focus:  Healthy Communication:   The focus of this group is to discuss communication, barriers to communication, as well as healthy ways to communicate with others.  Participation Level:  None  Participation Quality:  Appropriate, Drowsy and Inattentive  Affect:  Appropriate and Flat  Cognitive:  Appropriate  Engagement in Group:  None  Additional Comments:  Pt attended group discussing healthy communication and the five love languages.     Dalia Heading 09/07/2011, 4:28 PM

## 2011-09-07 NOTE — Progress Notes (Signed)
Patient ID: [name], female   DOB: August 11, 1980, 30 y.o.   MRN: [id number]   [facility] Group Notes:  (Counselor/Nursing/MHT/Case Management/Adjunct)  09/07/2011 1:15 PM  Type of Therapy:  Group Therapy, Dance/Movement Therapy   Participation Level:  Active  Participation Quality:  Appropriate  Affect:  Appropriate  Cognitive:  Appropriate  Insight:  Limited  Engagement in Group:  Good  Engagement in Therapy:  Good  Modes of Intervention:  Clarification, Problem-solving, Role-play, Socialization and Support  Summary of Progress/Problems: Therapist and group members discussed self-sabotage. Group members shared self-sabotaging behaviors and their battles with addiction. Therapist introduced the "Five Short Chapters" poem and group members shared their interpretations. Pt is still at a place where she wants to simply "walk around the whole."     Cassidi Long 09/07/2011. 2:25 PM

## 2011-09-07 NOTE — Progress Notes (Signed)
D- Patient irritable this am and demanding discharge. Process explained to patient and family. A- Stated she "was good with the discharge plan".  Mood improved over the course of the shift.  Reported depression and hopelessness as "ok".  Hand and finger wounds cleaned and dressed.  R- Ongoing support and encouragement. Continue current POC and evaluation of treatment goals. 15' checks cont for safety.

## 2011-09-07 NOTE — Progress Notes (Signed)
Patient ID: [name], female   DOB: 10-24-1980, 31 y.o.   MRN: [id number]  D: Patient initially lying in bed and appeared to be sleeping but come to nurse's station 5 minutes later requesting something to help her sleep. Was able to give to her neurontin that was just ordered by physician along with some vistaril.  A: Staff will monitor on q 15 minute checks and follow treatments and medication orders. R: No other complaints at this time. Went back to bed.

## 2011-09-07 NOTE — Progress Notes (Signed)
Patient ID: [name], female   DOB: 1981-01-07, 31 y.o.   MRN: [id number]  Pt. attended and participated in aftercare planning group. Pt. accepted information on suicide prevention, warning signs to look for with suicide and crisis line numbers to use. The pt. agreed to call crisis line numbers if having warning signs or having thoughts of suicide. Pt. listed their current anxiety level as 10 and their current depression level as 0. Pt shared that she is ready to go home so that she can see her son.

## 2011-09-08 MED ORDER — NICOTINE 21 MG/24HR TD PT24
21.0000 mg | MEDICATED_PATCH | Freq: Every day | TRANSDERMAL | Status: DC
  Administered 2011-09-08 – 2011-09-09 (×2): 21 mg via TRANSDERMAL
  Filled 2011-09-08 (×5): qty 1

## 2011-09-08 NOTE — Progress Notes (Signed)
Patient did attend the evening speaker AA meeting. Pt was alert and attentive. 

## 2011-09-08 NOTE — Progress Notes (Signed)
Psychoeducational Group Note  Date:  09/08/2011 Time:  1015  Group Topic/Focus:  Conflict Resolution:   The focus of this group is to discuss the conflict resolution process and how it may be used upon discharge.  Participation Level:  Minimal  Participation Quality:  Appropriate and Attentive  Affect:  Excited  Cognitive:  Alert  Insight:  Limited  Engagement in Group:  Good  Additional Comments: Redirected inappropriate sexual comment. Receptive.  Cresenciano Lick 09/08/2011, 1:48 PM

## 2011-09-08 NOTE — Progress Notes (Signed)
  [name] is a 31 y.o. female [id number] Nov 29, 1980  09/05/2011 Active Problems:  * No active hospital problems. *    Mental Status: Alert Mood is brighter more cheerful denies SI/HI/AVH.  Subjective/Objective: R hand and arm lacerations are cleaner than yesterday. Occlusive bandage is keeping the site "mushy" advised nurse to lightly cover so air can circulate.    Filed Vitals:   09/08/11 0701  BP: 113/75  Pulse: 122  Temp:   Resp:     Lab Results:   BMET    Component Value Date/Time   NA 138 09/03/2011 2115   K 3.9 09/03/2011 2115   CL 104 09/03/2011 2115   CO2 20 09/03/2011 2115   GLUCOSE 117* 09/03/2011 2115   BUN 13 09/03/2011 2115   CREATININE 1.22* 09/03/2011 2115   CALCIUM 9.2 09/03/2011 2115   GFRNONAA 58* 09/03/2011 2115   GFRAA 67* 09/03/2011 2115    Medications:  Scheduled:     . divalproex  250 mg Oral Q12H  . gabapentin  300 mg Oral TID  . nicotine  21 mg Transdermal Q0600  . QUEtiapine  100 mg Oral TID     PRN Meds acetaminophen, alum & mag hydroxide-simeth, hydrOXYzine, magnesium hydroxide, nicotine polacrilex  Plan: Continue current plan of care.   Ashante Yellin,MICKIE D. 09/08/2011

## 2011-09-08 NOTE — Progress Notes (Signed)
Progress note was performed by physician extender and as a supervising MD, available for assistance near by during this rounds. Agree with the findings.   

## 2011-09-08 NOTE — Progress Notes (Signed)
Patient ID: Dorthia Tout, female   DOB: 10/10/1980, 31 y.o.   MRN: [id number]   [facility] Group Notes:  (Counselor/Nursing/MHT/Case Management/Adjunct)  09/08/2011 1:15 PM  Type of Therapy:  Group Therapy, Dance/Movement Therapy   Participation Level:  None  Participation Quality:  Drowsy  Affect:  Flat  Cognitive:  Appropriate  Insight:  None  Engagement in Group:  None  Engagement in Therapy:  None  Modes of Intervention:  Clarification, Problem-solving, Role-play, Socialization and Support  Summary of Progress/Problems: Therapist and group members discussed how to use humor as a coping skill while still understanding the seriousness of addiction. Therapist and group members also discussed how to use humor and universal concepts to explain our addictions and struggles to our supports. Pt excused herself within first 5 minutes of group.      Cassidi Long 09/08/2011. 2:59 PM

## 2011-09-08 NOTE — Progress Notes (Signed)
D- Joyell states "Ifeel so good". Out in milieu interacting with peers and attending groups with participation.  A- Rates depression and hopelessness at 1. Reports good appetite,sleep, and energy. Some hypomania observed and receptive to redirection.  States medications are working very well.  R- Support and encouragement given. Continue current POC and evaluation of treatment goals Continue 15' checks for safety.
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
## 2011-09-08 NOTE — Progress Notes (Signed)
Psychoeducational Group Note  Date:  09/08/2011 Time:  1515  Group Topic/Focus:  Making Healthy Choices:   The focus of this group is to help patients identify negative/unhealthy choices they were using prior to admission and identify positive/healthier coping strategies to replace them upon discharge.  Participation Level:  Minimal  Participation Quality:  Appropriate, Intrusive and Redirectable  Affect:  Appropriate and Blunted  Cognitive:  Appropriate  Insight:  Good  Engagement in Group:  Limited  Additional Comments:   Pt attended group discussing the ten secrets of happiness.   Dalia Heading 09/08/2011, 4:09 PM
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
## 2011-09-08 NOTE — Progress Notes (Signed)
Patient ID: [name], female   DOB: 1980-10-07, 31 y.o.   MRN: [id number]  Pt. attended and participated in aftercare planning group. Pt. verbally accepted information on suicide prevention, warning signs to look for with suicide and crisis line numbers to use. Pt. listed their current anxiety level as 10 and their current depression level as 0. Pt shared that she is ready to go home.

## 2011-09-08 NOTE — Progress Notes (Signed)
Progress note was performed by physician extender and as a supervising MD, available for assistance near by during this rounds.  

## 2011-09-08 NOTE — Progress Notes (Signed)
Wound assessment reported to [facility] PA. Dressing presently is CDI.

## 2011-09-08 NOTE — Progress Notes (Signed)
Psychoeducational Group Note  Date:  09/08/2011 Time:  0945  Group Topic/Focus:  Spirituality:   The focus of this group is to discuss how one's spirituality can aide in recovery.  Participation Level:  Minimal  Participation Quality:  Appropriate, Intrusive, Redirectable and Sharing  Affect:  Anxious and Appropriate  Cognitive:  Appropriate  Insight:  Limited  Engagement in Group:  Limited  Additional Comments:   Pt attended non-denominational spirituality group. Pt discussed a sexual dream she had last night with group, needed to be redirected several times as she continued to interrupt other pts and this Clinical research associate. Pt stated several inappropriate/sexual comments about herself during group, again, needing redirection.    Dalia Heading 09/08/2011, 11:14 AM

## 2011-09-09 MED ORDER — GABAPENTIN 300 MG PO CAPS: 300.0000 mg | ORAL_CAPSULE | Freq: Three times a day (TID) | ORAL | Status: DC

## 2011-09-09 MED ORDER — DIVALPROEX SODIUM 250 MG PO DR TAB: 250.0000 mg | DELAYED_RELEASE_TABLET | Freq: Two times a day (BID) | ORAL | Status: DC

## 2011-09-09 MED ORDER — QUETIAPINE FUMARATE 100 MG PO TABS: 100.0000 mg | ORAL_TABLET | Freq: Three times a day (TID) | ORAL | Status: DC

## 2011-09-09 NOTE — Progress Notes (Signed)
Patient ID: [name], female   DOB: 08-31-1980, 31 y.o.   MRN: [id number] Patient has order for d/c. Denies SI/HI. She verbalizes understanding of follow up and medications. Mood appeared stable at time of d/c. Patient was picked up by family member. She received medication samples.

## 2011-09-09 NOTE — Progress Notes (Deleted)
Psychoeducational Group Note  Date:  09/09/2011 Time:  1000  Group Topic/Focus:  Therapeutic Activity: Human Bingo  Participation Level:  Active  Participation Quality:  Appropriate and Attentive  Affect:  Appropriate  Cognitive:  Appropriate  Insight:  Good  Engagement in Group:  Good  Additional Comments: Pt participated in group of Human Bingo. Pt was asked question about other patients and was given the opportunity to ask peers if it applied to them. An example may be "someone in the room wearing blue." Pt stated at the end of group, they learned information about peers they had no knowledge of before. Pt was able to interact with peers and share information that was therapeutic to learning facts about others.    Karleen Hampshire Brittini 09/09/2011, 10:52 AM
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
## 2011-09-09 NOTE — Progress Notes (Signed)
[facility] Case Management Discharge Plan:  Will you be returning to the same living situation after discharge: Yes,  home At discharge, do you have transportation home?:Yes,  mother Do you have the ability to pay for your medications:Yes,  mental health  Interagency Information:     Release of information consent forms completed and in the chart;  Patient's signature needed at discharge.  Patient to Follow up at:  Follow-up Information    Follow up with [facility]. (Walk in on Thursday or Fri this week between 8 and 9.  This is where you will see a Dr for your medicatiion.  They also have individual therapy and groups as well.)    Contact information:   [address]  [place]  [phone]         Patient denies SI/HI:   Yes,  yes    Safety Planning and Suicide Prevention discussed:  Yes,  yes  Barrier to discharge identified:No.  Summary and Recommendations:   [name] 09/09/2011, 2:05 PM

## 2011-09-09 NOTE — Progress Notes (Signed)
Psychoeducational Group Note  Date:  09/09/2011 Time:  1100  Group Topic/Focus:  Wellness Toolbox:   The focus of this group is to discuss various aspects of wellness, balancing those aspects and exploring ways to increase the ability to experience wellness.  Patients will create a wellness toolbox for use upon discharge.  Participation Level:  Active  Participation Quality:  Appropriate and Attentive  Affect:  Appropriate  Cognitive:  Alert and Appropriate  Insight:  Good  Engagement in Group:  Good  Additional Comments:  Pt was appropriate and was willing to participate in a activity that consisted of different areas of  wellness. Pt was able to find things that were positive and made him feel good as well as provide emergency numbers that could be used if they were in a crisis in the future.  Sharyn Lull 09/09/2011, 12:01 PM

## 2011-09-09 NOTE — Progress Notes (Signed)
[facility] Group Notes:  (Counselor/Nursing/MHT/Case Management/Adjunct)   Type of Therapy:  Group Therapy at 1:15 to 2:30 PM  Participation Level:  Minimal  Participation Quality:  Inattentive, in and out of group room and Resistant  Affect:  Irritable  Cognitive:  Alert and Oriented  Insight:  Limited  Engagement in Group:  Limited  Engagement in Therapy:  None  Modes of Intervention:  Limit-setting, Socialization and Support  Summary of Progress/Problems: Group discussion focused on what patient's see as their own obstacles to recovery.  Patient shared that "nothing will be difficult to deal with once I get out of here." Patient was heard and requested to stop, patient became more irritable left group room to return in 30 minutes.     Clide Dales 09/09/2011,

## 2011-09-09 NOTE — Progress Notes (Signed)
Patient ID: [name], female   DOB: 1980-02-23, 31 y.o.   MRN: [id number] Has been out and about on hall this evening, attended group, feels she has gained insight of her condition, and recognizes and voices the need for staying sober.   States when she drinks, she drinks to the point of passing out or losing her judgment, and held up her lacerated hands.  Talked about how much she misses her baby and that she needs to be sober for him.  After group, took shower, washed her rt elbow well, dsg showing purulent drainage from site. Was cleaned well with betadine swab and left open to air to dry. The lacerations on her hands were cleaned with betadine swabs, are still reddened, still swollen, but no drainage noted tonight. Has several lacerations that are still gaping and steri strips were applied.  Talked about going back to school and wanting to get her life together, wants to be a good mother for her son.  Has needed redirection about low cut halter top and to put on a less revealing top, which she did.  Has been cooperative and compliant with meds.  Hoping for discharge tomorrow.  Will continue to monitor for safety, continue POC.

## 2011-09-09 NOTE — Progress Notes (Signed)
Psychoeducational Group Note  Date:  09/09/2011 Time: 1000  Group Topic/Focus:  Therapeutic Activity: Human Bingo  Participation Level: Did Not Attend  Participation Quality:  Not Applicable  Affect:  Not Applicable  Cognitive:  Not Applicable  Insight:  Not Applicable  Engagement in Group: Not Applicable  Additional Comments:  Pt came into group and stated she was "tired" and went back to room and went to bed.  Karleen Hampshire Brittini 09/09/2011, 10:57 AM

## 2011-09-09 NOTE — Progress Notes (Addendum)
Interdisciplinary Treatment Plan Update (Adult)  Date: 09/09/2011  Time Reviewed: 10:27 AM   Progress in Treatment: Attending groups: Yes Participating in groups: Yes Taking medication as prescribed:  Yes Tolerating medication:  Yes Family/Significant other contact made: No Patient understands diagnosis: Yes  As evidenced by asking for help with detox, mood stabilization Discussing patient identified problems/goals with staff:   See below Medical problems stabilized or resolved:  Yes Denies suicidal/homicidal ideation: Yes  In tx team  Issues/concerns per patient self-inventory:  None noted Other:  New problem(s) identified:  Reason for Continuation of Hospitalization:  None    Interventions implemented related to continuation of hospitalization:  Additional comments:  Estimated length of stay:  D/C today  Discharge Plan:See below  New goal(s):  Review of initial/current patient goals per problem list:   1.  Goal(s):  Safely detox from alcohol  Met:  Yes  Target date:8/26  As evidenced by: stable vitals, no withdrawal symptoms  2.  Goal (s): Stabilize mood  Met:  Yes  Target date:8/26  As evidenced by: Misty Stanley states her mood is good-experiencing no depression or mood instability  3.  Goal(s): Eliminate SI  Met:  Yes  Target date:8/26  As evidenced by: Self report in tx team  4.  Goal(s):Identify comprehensive sobriety plan  Met:  Yes  Target date:8/26  As evidenced ZO:[id number] reports she will return home, follow up at [facility] and attend daily AA mtgs  Attendees: Patient: [name] 09/09/2011 10:27 AM  Family:     Physician:  Lenice Llamas 09/09/2011 10:27 AM   Nursing:   Roswell Miners, RN 09/09/2011 10:27 AM   Case Manager:  Richelle Ito, LCSW 09/09/2011 10:27 AM   Counselor:  Ronda Fairly, LCSWA  09/09/2011 10:27 AM   Other:  Alease Frame, RN 09/09/2011 10:27 AM   Other:   09/09/2011 10:27 AM   Other:   09/09/2011 10:27 AM   Other:   09/09/2011  10:27 AM    Scribe for Treatment Team:

## 2011-09-09 NOTE — Progress Notes (Signed)
Patient did attend the evening speaker AA meeting.  

## 2011-09-09 NOTE — Progress Notes (Signed)
D- Out on unit interacting with peers. A- Hypomanic. Rates depression at 1 and hopelessness at 0. Denies SI.  Compliant with medications and states "they are working".  Feels she is ready for d/c.  R- Goals are to attend AA/NA.get a new phone and go to gym.  Continue current POC and evaluation of treatment goals. Continue 15' checks for safety.

## 2011-09-11 NOTE — Progress Notes (Signed)
Patient Discharge Instructions:  After Visit Summary (AVS):   Faxed to:  09/11/2011 Psychiatric Admission Assessment Note:   Faxed to:  09/11/2011 Suicide Risk Assessment - Discharge Assessment:   Faxed to:  09/11/2011 Faxed/Sent to the Next Level Care provider:  09/11/2011  Faxed to [facility] @ [phone]  Heloise Purpura Eduard Clos, 09/11/2011, 4:16 PM

## 2011-09-23 NOTE — Discharge Summary (Signed)
Physician Discharge Summary Note  Patient:  [name] is an 31 y.o., female MRN:  [id number] DOB:  Feb 04, 1980 Patient phone:  [phone] (home)  Patient address:   [address] [place]  [place] [address]   Date of Admission:  09/05/2011 Date of Discharge: 09/09/11  Reason for Admission: see H&P.  Discharge Diagnoses: Alcohol Use Disorder; Cannabis Use Disorder, mild; Mood Disorder Unspecified; BPAD, per Hx; r/o PD NOS with Cluster B Traits   Level of Care:  OP  Hospital Course:  Pt admitted for crisis stabilization, detox and treatment. Pt attended all therapeutic groups, was active in her treatment planning process and agreed to her current medication regimen for further stability during recovery.  There were no acute issues during treatment and she was open to further residential substance abuse Tx.  All labs were reviewed with her in great detail and medical needs were addressed.  No acute safety issues were noted on the unit. Medications were reviewed with pt and medication education was provided. Mental health treatment, medication management and continued sobriety will mitigate against any increased risk of harm to self and/or others.  Discussed the importance of recovery with pt, as well as, tools to move forward in a healthy & safe manner using the 12 Step Process.    Consults: none.  Significant Diagnostic Studies:  See labs.  Discharge Vitals:   Blood pressure 124/82, pulse 91, temperature 97.5 F (36.4 C), temperature source Oral, resp. rate 20, height 5\' 4"  (1.626 m), weight 80.74 kg (178 lb).  Physical Findings: AIMS: Facial and Oral Movements Muscles of Facial Expression: None, normal Lips and Perioral Area: None, normal Jaw: None, normal Tongue: None, normal,Extremity Movements Upper (arms, wrists, hands, fingers): None, normal Lower (legs, knees, ankles, toes): None, normal, Trunk Movements Neck, shoulders, hips: None, normal, Overall  Severity Severity of abnormal movements (highest score from questions above): None, normal Incapacitation due to abnormal movements: None, normal Patient's awareness of abnormal movements (rate only patient's report): No Awareness, Dental Status Current problems with teeth and/or dentures?: No Does patient usually wear dentures?: No  CIWA:  CIWA-Ar Total: 0   Mental Status Exam: See Mental Status Examination and Suicide Risk Assessment completed by Attending Physician prior to discharge.  Discharge destination:  Home  Is patient on multiple antipsychotic therapies at discharge:  No   Has Patient had three or more failed trials of antipsychotic monotherapy by history:  No  Recommended Plan for Multiple Antipsychotic Therapies: NA  Discharge Orders    Future Orders Please Complete By Expires   Diet general        Medication List  As of 09/23/2011 11:24 AM   TAKE these medications      Indication    divalproex 250 MG DR tablet   Commonly known as: DEPAKOTE   Take 1 tablet (250 mg total) by mouth every 12 (twelve) hours. For mood stability.       gabapentin 300 MG capsule   Commonly known as: NEURONTIN   Take 1 capsule (300 mg total) by mouth 3 (three) times daily. For mood stability, pain and anxiety.       QUEtiapine 100 MG tablet   Commonly known as: SEROQUEL   Take 1 tablet (100 mg total) by mouth 3 (three) times daily. For mood stability, agitation and anxiety.            Follow-up Information    Follow up with [facility]. (Walk in on Thursday or Fri this week  between 8 and 9.  This is where you will see a Dr for your medicatiion.  They also have individual therapy and groups as well.)    Contact information:   [address]  [place]  [phone]         Plan Of Care/Follow-up recommendations: Pt seen and evaluated in treatment team. Chart reviewed. Pt stable for and requesting discharge home. Pt contracting for safety and does not currently meet [place] involuntary  commitment criteria for continued hospitalization against her will. Mental health treatment, medication management and continued sobriety will mitigate against the potential increased risk of harm to self and others. Discussed the importance of recovery further with pt, as well as, tools to move forward in a healthy & safe manner. Pt agreeable with the plan. Discussed with the team. Recommend follow up with AA/NA. Diet: Regular. Activity: As tolerated.    Signed: Lupe Carney 09/23/2011, 11:24 AM

## 2012-02-02 ENCOUNTER — Inpatient Hospital Stay (HOSPITAL_COMMUNITY): Payer: Medicare Other

## 2012-02-02 ENCOUNTER — Encounter (HOSPITAL_COMMUNITY): Payer: Self-pay | Admitting: Emergency Medicine

## 2012-02-02 ENCOUNTER — Observation Stay (HOSPITAL_COMMUNITY): Payer: Medicare Other

## 2012-02-02 ENCOUNTER — Inpatient Hospital Stay (HOSPITAL_COMMUNITY)
Admission: EM | Admit: 2012-02-02 | Discharge: 2012-02-08 | DRG: 440 | Disposition: A | Discharge status: Home / Self Care | Payer: Medicare Other | Attending: Internal Medicine | Admitting: Internal Medicine

## 2012-02-02 ENCOUNTER — Emergency Department (HOSPITAL_COMMUNITY): Payer: Medicare Other

## 2012-02-02 DIAGNOSIS — O99340 Other mental disorders complicating pregnancy, unspecified trimester: Secondary | ICD-10-CM

## 2012-02-02 DIAGNOSIS — E876 Hypokalemia: Secondary | ICD-10-CM

## 2012-02-02 DIAGNOSIS — R000 Tachycardia, unspecified: Secondary | ICD-10-CM | POA: Diagnosis present

## 2012-02-02 DIAGNOSIS — O99330 Smoking (tobacco) complicating pregnancy, unspecified trimester: Secondary | ICD-10-CM

## 2012-02-02 DIAGNOSIS — Z3400 Encounter for supervision of normal first pregnancy, unspecified trimester: Secondary | ICD-10-CM

## 2012-02-02 DIAGNOSIS — F319 Bipolar disorder, unspecified: Secondary | ICD-10-CM | POA: Diagnosis present

## 2012-02-02 DIAGNOSIS — E860 Dehydration: Secondary | ICD-10-CM | POA: Diagnosis present

## 2012-02-02 DIAGNOSIS — M4120 Other idiopathic scoliosis, site unspecified: Secondary | ICD-10-CM | POA: Diagnosis present

## 2012-02-02 DIAGNOSIS — M419 Scoliosis, unspecified: Secondary | ICD-10-CM | POA: Diagnosis present

## 2012-02-02 DIAGNOSIS — IMO0001 Reserved for inherently not codable concepts without codable children: Secondary | ICD-10-CM | POA: Diagnosis present

## 2012-02-02 DIAGNOSIS — F17200 Nicotine dependence, unspecified, uncomplicated: Secondary | ICD-10-CM | POA: Diagnosis present

## 2012-02-02 DIAGNOSIS — I959 Hypotension, unspecified: Secondary | ICD-10-CM | POA: Diagnosis present

## 2012-02-02 DIAGNOSIS — F411 Generalized anxiety disorder: Secondary | ICD-10-CM | POA: Diagnosis present

## 2012-02-02 DIAGNOSIS — K859 Acute pancreatitis without necrosis or infection, unspecified: Principal | ICD-10-CM | POA: Diagnosis present

## 2012-02-02 DIAGNOSIS — D72829 Elevated white blood cell count, unspecified: Secondary | ICD-10-CM | POA: Diagnosis present

## 2012-02-02 DIAGNOSIS — Z720 Tobacco use: Secondary | ICD-10-CM | POA: Diagnosis present

## 2012-02-02 DIAGNOSIS — A64 Unspecified sexually transmitted disease: Secondary | ICD-10-CM

## 2012-02-02 DIAGNOSIS — F1010 Alcohol abuse, uncomplicated: Secondary | ICD-10-CM | POA: Diagnosis present

## 2012-02-02 DIAGNOSIS — F1910 Other psychoactive substance abuse, uncomplicated: Secondary | ICD-10-CM | POA: Diagnosis present

## 2012-02-02 HISTORY — DX: Alcohol abuse, uncomplicated: F10.10

## 2012-02-02 HISTORY — DX: Scoliosis, unspecified: M41.9

## 2012-02-02 HISTORY — DX: Tobacco use: Z72.0

## 2012-02-02 LAB — CBC WITH DIFFERENTIAL/PLATELET
Basophils Absolute: 0 10*3/uL (ref 0.0–0.1)
Eosinophils Absolute: 0 10*3/uL (ref 0.0–0.7)
Eosinophils Relative: 0 % (ref 0–5)
HCT: 42.2 % (ref 36.0–46.0)
Lymphocytes Relative: 11 % — ABNORMAL LOW (ref 12–46)
MCH: 30.2 pg (ref 26.0–34.0)
MCV: 85.4 fL (ref 78.0–100.0)
Monocytes Absolute: 1.3 10*3/uL — ABNORMAL HIGH (ref 0.1–1.0)
Platelets: 277 10*3/uL (ref 150–400)
RDW: 16.5 % — ABNORMAL HIGH (ref 11.5–15.5)
WBC: 21 10*3/uL — ABNORMAL HIGH (ref 4.0–10.5)

## 2012-02-02 LAB — URINALYSIS, ROUTINE W REFLEX MICROSCOPIC
Nitrite: NEGATIVE
Protein, ur: 30 mg/dL — AB
Urobilinogen, UA: 1 mg/dL (ref 0.0–1.0)
pH: 6 (ref 5.0–8.0)

## 2012-02-02 LAB — COMPREHENSIVE METABOLIC PANEL
CO2: 23 mEq/L (ref 19–32)
Calcium: 9.5 mg/dL (ref 8.4–10.5)
Creatinine, Ser: 0.82 mg/dL (ref 0.50–1.10)
GFR calc Af Amer: >90 mL/min (ref >90)
GFR calc non Af Amer: >90 mL/min (ref >90)
Glucose, Bld: 123 mg/dL — ABNORMAL HIGH (ref 70–99)
Sodium: 131 mEq/L — ABNORMAL LOW (ref 135–145)
Total Protein: 7.5 g/dL (ref 6.0–8.3)

## 2012-02-02 LAB — URINE MICROSCOPIC-ADD ON

## 2012-02-02 LAB — MAGNESIUM: Magnesium: 1.6 mg/dL (ref 1.5–2.5)

## 2012-02-02 MED ORDER — THIAMINE HCL 100 MG/ML IJ SOLN
100.0000 mg | Freq: Every day | INTRAMUSCULAR | Status: DC
  Filled 2012-02-02 (×6): qty 1

## 2012-02-02 MED ORDER — SODIUM CHLORIDE 0.9 % IV BOLUS (SEPSIS): 1000.0000 mL | Freq: Once | INTRAVENOUS | Status: DC

## 2012-02-02 MED ORDER — SODIUM CHLORIDE 0.9 % IJ SOLN
3.0000 mL | Freq: Two times a day (BID) | INTRAMUSCULAR | Status: DC
  Administered 2012-02-05 – 2012-02-08 (×2): 3 mL via INTRAVENOUS

## 2012-02-02 MED ORDER — BISACODYL 5 MG PO TBEC: 5.0000 mg | DELAYED_RELEASE_TABLET | Freq: Every day | ORAL | Status: DC | PRN

## 2012-02-02 MED ORDER — ONDANSETRON HCL 4 MG/2ML IJ SOLN
4.0000 mg | Freq: Once | INTRAMUSCULAR | Status: AC
  Administered 2012-02-02: 4 mg via INTRAVENOUS
  Filled 2012-02-02: qty 2

## 2012-02-02 MED ORDER — ACETAMINOPHEN 325 MG PO TABS: 650.0000 mg | ORAL_TABLET | Freq: Four times a day (QID) | ORAL | Status: DC | PRN

## 2012-02-02 MED ORDER — HYDROMORPHONE HCL PF 1 MG/ML IJ SOLN
1.0000 mg | Freq: Once | INTRAMUSCULAR | Status: AC
  Administered 2012-02-02: 1 mg via INTRAVENOUS
  Filled 2012-02-02: qty 1

## 2012-02-02 MED ORDER — SODIUM CHLORIDE 0.9 % IV SOLN: INTRAVENOUS | Status: AC

## 2012-02-02 MED ORDER — FOLIC ACID 1 MG PO TABS
1.0000 mg | ORAL_TABLET | Freq: Every day | ORAL | Status: DC
  Administered 2012-02-02 – 2012-02-08 (×7): 1 mg via ORAL
  Filled 2012-02-02 (×7): qty 1

## 2012-02-02 MED ORDER — ENOXAPARIN SODIUM 40 MG/0.4ML ~~LOC~~ SOLN
40.0000 mg | SUBCUTANEOUS | Status: DC
  Filled 2012-02-02 (×7): qty 0.4

## 2012-02-02 MED ORDER — POLYETHYLENE GLYCOL 3350 17 G PO PACK
17.0000 g | PACK | Freq: Every day | ORAL | Status: DC | PRN
  Filled 2012-02-02: qty 1

## 2012-02-02 MED ORDER — LORAZEPAM 2 MG/ML IJ SOLN
1.0000 mg | Freq: Once | INTRAMUSCULAR | Status: AC
  Administered 2012-02-02: 1 mg via INTRAVENOUS
  Filled 2012-02-02: qty 1

## 2012-02-02 MED ORDER — GI COCKTAIL ~~LOC~~
30.0000 mL | Freq: Three times a day (TID) | ORAL | Status: DC | PRN
  Administered 2012-02-06: 30 mL via ORAL
  Filled 2012-02-02: qty 30

## 2012-02-02 MED ORDER — FLEET ENEMA 7-19 GM/118ML RE ENEM: 1.0000 | ENEMA | Freq: Once | RECTAL | Status: AC | PRN

## 2012-02-02 MED ORDER — DOCUSATE SODIUM 100 MG PO CAPS
100.0000 mg | ORAL_CAPSULE | Freq: Two times a day (BID) | ORAL | Status: DC
  Administered 2012-02-03 – 2012-02-06 (×6): 100 mg via ORAL
  Filled 2012-02-02 (×14): qty 1

## 2012-02-02 MED ORDER — LORAZEPAM 2 MG/ML IJ SOLN
1.0000 mg | Freq: Four times a day (QID) | INTRAMUSCULAR | Status: AC | PRN
  Administered 2012-02-03 – 2012-02-04 (×2): 1 mg via INTRAVENOUS
  Filled 2012-02-02 (×2): qty 1

## 2012-02-02 MED ORDER — ACETAMINOPHEN 650 MG RE SUPP: 650.0000 mg | Freq: Four times a day (QID) | RECTAL | Status: DC | PRN

## 2012-02-02 MED ORDER — ADULT MULTIVITAMIN W/MINERALS CH
1.0000 | ORAL_TABLET | Freq: Every day | ORAL | Status: DC
  Administered 2012-02-02 – 2012-02-08 (×7): 1 via ORAL
  Filled 2012-02-02 (×7): qty 1

## 2012-02-02 MED ORDER — PANTOPRAZOLE SODIUM 40 MG IV SOLR
40.0000 mg | INTRAVENOUS | Status: DC
  Administered 2012-02-02 – 2012-02-06 (×5): 40 mg via INTRAVENOUS
  Filled 2012-02-02 (×6): qty 40

## 2012-02-02 MED ORDER — VITAMIN B-1 100 MG PO TABS
100.0000 mg | ORAL_TABLET | Freq: Every day | ORAL | Status: DC
  Administered 2012-02-02 – 2012-02-08 (×7): 100 mg via ORAL
  Filled 2012-02-02 (×7): qty 1

## 2012-02-02 MED ORDER — ALUM & MAG HYDROXIDE-SIMETH 200-200-20 MG/5ML PO SUSP
30.0000 mL | Freq: Four times a day (QID) | ORAL | Status: DC | PRN
  Administered 2012-02-02: 30 mL via ORAL
  Filled 2012-02-02: qty 30

## 2012-02-02 MED ORDER — ONDANSETRON HCL 4 MG PO TABS
4.0000 mg | ORAL_TABLET | Freq: Four times a day (QID) | ORAL | Status: DC | PRN
  Administered 2012-02-08: 4 mg via ORAL
  Filled 2012-02-02: qty 1

## 2012-02-02 MED ORDER — SODIUM CHLORIDE 0.9 % IV BOLUS (SEPSIS)
2000.0000 mL | Freq: Once | INTRAVENOUS | Status: AC
  Administered 2012-02-02: 2000 mL via INTRAVENOUS

## 2012-02-02 MED ORDER — LORAZEPAM 1 MG PO TABS: 1.0000 mg | ORAL_TABLET | Freq: Four times a day (QID) | ORAL | Status: AC | PRN

## 2012-02-02 MED ORDER — LORAZEPAM 2 MG/ML IJ SOLN
0.0000 mg | Freq: Four times a day (QID) | INTRAMUSCULAR | Status: AC
  Administered 2012-02-02: 1 mg via INTRAVENOUS
  Administered 2012-02-03 (×3): 2 mg via INTRAVENOUS
  Administered 2012-02-03: 1 mg via INTRAVENOUS
  Administered 2012-02-04: 2 mg via INTRAVENOUS
  Administered 2012-02-04: 1 mg via INTRAVENOUS
  Filled 2012-02-02 (×6): qty 1

## 2012-02-02 MED ORDER — SODIUM CHLORIDE 0.9 % IV SOLN
1000.0000 mL | INTRAVENOUS | Status: DC
  Administered 2012-02-02 – 2012-02-03 (×3): 1000 mL via INTRAVENOUS

## 2012-02-02 MED ORDER — IOHEXOL 300 MG/ML  SOLN
100.0000 mL | Freq: Once | INTRAMUSCULAR | Status: AC | PRN
  Administered 2012-02-02: 100 mL via INTRAVENOUS

## 2012-02-02 MED ORDER — LORAZEPAM 2 MG/ML IJ SOLN
0.0000 mg | Freq: Two times a day (BID) | INTRAMUSCULAR | Status: AC
  Administered 2012-02-04: 1 mg via INTRAVENOUS
  Administered 2012-02-05: 2 mg via INTRAVENOUS
  Administered 2012-02-06: 1 mg via INTRAVENOUS
  Filled 2012-02-02 (×3): qty 1

## 2012-02-02 MED ORDER — NICOTINE 21 MG/24HR TD PT24
21.0000 mg | MEDICATED_PATCH | Freq: Once | TRANSDERMAL | Status: AC
  Administered 2012-02-02: 21 mg via TRANSDERMAL
  Filled 2012-02-02: qty 1

## 2012-02-02 MED ORDER — MORPHINE SULFATE 2 MG/ML IJ SOLN
1.0000 mg | INTRAMUSCULAR | Status: DC | PRN
  Administered 2012-02-02 – 2012-02-03 (×4): 2 mg via INTRAVENOUS
  Filled 2012-02-02 (×5): qty 1

## 2012-02-02 MED ORDER — SODIUM CHLORIDE 0.9 % IV SOLN
1000.0000 mL | Freq: Once | INTRAVENOUS | Status: AC
  Administered 2012-02-02: 1000 mL via INTRAVENOUS

## 2012-02-02 MED ORDER — IOHEXOL 350 MG/ML SOLN: 100.0000 mL | Freq: Once | INTRAVENOUS | Status: DC | PRN

## 2012-02-02 MED ORDER — ONDANSETRON HCL 4 MG/2ML IJ SOLN
4.0000 mg | Freq: Four times a day (QID) | INTRAMUSCULAR | Status: DC | PRN
  Administered 2012-02-02 – 2012-02-07 (×14): 4 mg via INTRAVENOUS
  Filled 2012-02-02 (×15): qty 2
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
## 2012-02-02 NOTE — ED Notes (Signed)
Pt alert, arrives from home, c/o "feels like someone is kicking me in the stomach", onset was two days ago, states pain is horrible, poor appetite, resp even unlabored, skin pwd, denies recent bowel movement

## 2012-02-02 NOTE — ED Notes (Signed)
Attempted to call report to floor.  RN unable to take report.  

## 2012-02-03 DIAGNOSIS — F1010 Alcohol abuse, uncomplicated: Secondary | ICD-10-CM

## 2012-02-03 DIAGNOSIS — F319 Bipolar disorder, unspecified: Secondary | ICD-10-CM

## 2012-02-03 DIAGNOSIS — E860 Dehydration: Secondary | ICD-10-CM

## 2012-02-03 DIAGNOSIS — K859 Acute pancreatitis without necrosis or infection, unspecified: Principal | ICD-10-CM

## 2012-02-03 LAB — COMPREHENSIVE METABOLIC PANEL
AST: 10 U/L (ref 0–37)
Albumin: 2.6 g/dL — ABNORMAL LOW (ref 3.5–5.2)
Alkaline Phosphatase: 63 U/L (ref 39–117)
CO2: 21 mEq/L (ref 19–32)
Chloride: 102 mEq/L (ref 96–112)
GFR calc non Af Amer: >90 mL/min (ref >90)
Potassium: 3.3 mEq/L — ABNORMAL LOW (ref 3.5–5.1)
Total Bilirubin: 2.2 mg/dL — ABNORMAL HIGH (ref 0.3–1.2)

## 2012-02-03 LAB — CBC WITH DIFFERENTIAL/PLATELET
Basophils Absolute: 0 10*3/uL (ref 0.0–0.1)
Basophils Relative: 0 % (ref 0–1)
Eosinophils Absolute: 0 10*3/uL (ref 0.0–0.7)
Eosinophils Relative: 0 % (ref 0–5)
Lymphs Abs: 1.9 10*3/uL (ref 0.7–4.0)
MCH: 29.9 pg (ref 26.0–34.0)
MCHC: 35 g/dL (ref 30.0–36.0)
MCV: 85.5 fL (ref 78.0–100.0)
Neutrophils Relative %: 82 % — ABNORMAL HIGH (ref 43–77)
Platelets: 205 10*3/uL (ref 150–400)
RDW: 16.4 % — ABNORMAL HIGH (ref 11.5–15.5)

## 2012-02-03 LAB — LIPASE, BLOOD: Lipase: 59 U/L (ref 11–59)

## 2012-02-03 MED ORDER — POTASSIUM CHLORIDE 10 MEQ/100ML IV SOLN
10.0000 meq | INTRAVENOUS | Status: AC
  Administered 2012-02-03 (×5): 10 meq via INTRAVENOUS
  Filled 2012-02-03 (×5): qty 100

## 2012-02-03 MED ORDER — MAGNESIUM SULFATE 40 MG/ML IJ SOLN
2.0000 g | Freq: Once | INTRAMUSCULAR | Status: AC
  Administered 2012-02-03: 2 g via INTRAVENOUS
  Filled 2012-02-03: qty 50

## 2012-02-03 MED ORDER — SODIUM CHLORIDE 0.9 % IV SOLN
1000.0000 mL | INTRAVENOUS | Status: DC
  Administered 2012-02-03 – 2012-02-08 (×10): 1000 mL via INTRAVENOUS

## 2012-02-03 MED ORDER — MORPHINE SULFATE 2 MG/ML IJ SOLN
2.0000 mg | INTRAMUSCULAR | Status: DC | PRN
  Administered 2012-02-03: 4 mg via INTRAVENOUS
  Administered 2012-02-03 (×2): 2 mg via INTRAVENOUS
  Administered 2012-02-03 – 2012-02-04 (×2): 4 mg via INTRAVENOUS
  Administered 2012-02-04: 2 mg via INTRAVENOUS
  Administered 2012-02-04 – 2012-02-05 (×7): 4 mg via INTRAVENOUS
  Administered 2012-02-05: 2 mg via INTRAVENOUS
  Administered 2012-02-05 (×2): 4 mg via INTRAVENOUS
  Administered 2012-02-06 (×2): 2 mg via INTRAVENOUS
  Administered 2012-02-06: 4 mg via INTRAVENOUS
  Administered 2012-02-06: 2 mg via INTRAVENOUS
  Administered 2012-02-06: 4 mg via INTRAVENOUS
  Administered 2012-02-07 (×4): 2 mg via INTRAVENOUS
  Filled 2012-02-03 (×2): qty 1
  Filled 2012-02-03 (×9): qty 2
  Filled 2012-02-03 (×2): qty 1
  Filled 2012-02-03: qty 2
  Filled 2012-02-03 (×3): qty 1
  Filled 2012-02-03 (×4): qty 2
  Filled 2012-02-03 (×2): qty 1
  Filled 2012-02-03 (×2): qty 2
  Filled 2012-02-03 (×2): qty 1

## 2012-02-03 MED ORDER — NICOTINE 21 MG/24HR TD PT24
21.0000 mg | MEDICATED_PATCH | Freq: Every day | TRANSDERMAL | Status: DC
  Administered 2012-02-03 – 2012-02-08 (×7): 21 mg via TRANSDERMAL
  Filled 2012-02-03 (×8): qty 1

## 2012-02-03 MED ORDER — SODIUM CHLORIDE 0.9 % IV BOLUS (SEPSIS)
1000.0000 mL | Freq: Once | INTRAVENOUS | Status: AC
  Administered 2012-02-03: 1000 mL via INTRAVENOUS

## 2012-02-03 NOTE — Progress Notes (Signed)
The pt refused to have lovonox injection last nite, & didn't want to be weighed this am when tech came into weigh her, because she was in such pain

## 2012-02-03 NOTE — Progress Notes (Signed)
[facility] PROGRESS NOTE  Emalene Welte WGN:[id number] DOB: 09-Jul-1980 DOA: 02/02/2012 PCP: No primary provider on file.  Assessment/Plan:  #1 acute pancreatitis  Likely alcohol induced. Patient does have a history of alcohol use. Alcohol level is less than 11. Abdominal ultrasound is negative for gallstones. Patient with normal LFTs. CT of the abdomen and pelvis consistent with acute pancreatitis. Patient still with significant abdominal pain. We'll keep n.p.o. Increase IV fluids to 150 cc per hour. We'll give a 1 L fluid bolus. Continue with IV pain medication, supportive care. Follow.  #2 borderline hypotension  Likely secondary to volume depletion. Patient does have a leukocytosis however urinalysis is negative for UTI. Chest x-ray is negative. Improving with IV fluids. Follow.  #3 dehydration  IV fluids.  #4 generalized anxiety disorder  Ativan as needed.  #5 bipolar disorder  Patient stated she stopped taking her medications about 2-3 months ago secondary to increased lethargy and drowsiness. Patient states has a child at home and as such cannot be drowsy. Will consult with psychiatry for further evaluation and management and recommendations for medications which may be used which may be less drowsy.  #6 tobacco abuse  Tobacco cessation. Continue nicotine patch.  #7 history of alcohol use  Will replete magnesium.  Continue Ativan CIWA protocol. Continue thiamine and folic acid. #8 leukocytosis  Likely a reactive leukocytosis secondary to problem #1. Urinalysis is not consistent with a UTI. Chest x-ray negative. Leukocytosis is slowly trending down. No need for antibiotics at this time. Follow. #9 tachycardia  Likely secondary to dehydration volume depletion to problem #1. EKG with normal sinus rhythm. Continue IV fluids and supportive care. Follow.  #10 prophylaxis  PPI for GI prophylaxis. Lovenox for DVT prophylaxis.      Code Status: Full Family Communication: Updated  patient and boyfriend at bedside Disposition Plan: Home when medically stable.   Consultants:  None  Procedures:  CT abdomen and pelvis 02/02/2012  Antibiotics:  None  HPI/Subjective: Patient still complaining of abdominal pain. Patient states pain medication helps with the pain for approximately an hour. Patient asking for broth.  Objective: Filed Vitals:   02/02/12 1730 02/02/12 1740 02/02/12 2300 02/03/12 0632  BP: 127/76  112/71 109/68  Pulse: 114  125 115  Temp: 99.9 F (37.7 C)  99.7 F (37.6 C) 98.4 F (36.9 C)  TempSrc: Oral  Oral Oral  Resp: 18  20 22   Height:  5\' 5"  (1.651 m)    Weight:  75.7 kg (166 lb 14.2 oz)    SpO2: 96%  95% 96%    Intake/Output Summary (Last 24 hours) at 02/03/12 0933 Last data filed at 02/03/12 0700  Gross per 24 hour  Intake 2902.33 ml  Output    900 ml  Net 2002.33 ml   Filed Weights   02/02/12 0633 02/02/12 0646 02/02/12 1740  Weight: 19.958 kg (44 lb) 75.751 kg (167 lb) 75.7 kg (166 lb 14.2 oz)    Exam:   General:  Sleeping. Easily arousable.  Cardiovascular: Tachycardic regular rhythm  Respiratory: Clear to auscultation bilaterally.  Abdomen: Soft, tender to palpation in the epigastrium, positive bowel sounds, nondistended.  Data Reviewed: Basic Metabolic Panel:  Lab 02/03/12 7846 02/02/12 1836 02/02/12 0700  NA 134* -- 131*  K 3.3* -- 3.7  CL 102 -- 96  CO2 21 -- 23  GLUCOSE 88 -- 123*  BUN 4* -- 8  CREATININE 0.76 -- 0.82  CALCIUM 9.0 -- 9.5  MG 1.6 1.6 --  PHOS -- -- --  Liver Function Tests:  Lab 02/03/12 0445 02/02/12 0700  AST 10 16  ALT 7 11  ALKPHOS 63 67  BILITOT 2.2* 2.4*  PROT 5.8* 7.5  ALBUMIN 2.6* 3.9    Lab 02/03/12 0445 02/02/12 0700  LIPASE 59 192*  AMYLASE -- --   No results found for this basename: AMMONIA:5 in the last 168 hours CBC:  Lab 02/03/12 0445 02/02/12 0700  WBC 17.3* 21.0*  NEUTROABS 14.3* 17.4*  HGB 11.8* 14.9  HCT 33.7* 42.2  MCV 85.5 85.4  PLT 205  277   Cardiac Enzymes: No results found for this basename: CKTOTAL:5,CKMB:5,CKMBINDEX:5,TROPONINI:5 in the last 168 hours BNP (last 3 results) No results found for this basename: PROBNP:3 in the last 8760 hours CBG: No results found for this basename: GLUCAP:5 in the last 168 hours  No results found for this or any previous visit (from the past 240 hour(s)).   Studies: Dg Chest 2 View  02/02/2012  *RADIOLOGY REPORT*  Clinical Data: Leukocytosis.  Sternal pain.  CHEST - 2 VIEW  Comparison: None  Findings: The cardiac silhouette, mediastinal and hilar contours are within normal limits.  There is streaky bibasilar subsegmental atelectasis without definite infiltrate or effusion.  The bony thorax is intact.  IMPRESSION: Bibasilar subsegmental atelectasis.   Original Report Authenticated By: Rudie Meyer, M.D.    US Abdomen Complete  02/02/2012  *RADIOLOGY REPORT*  Clinical Data:  Abdominal pain  ABDOMINAL ULTRASOUND COMPLETE  Comparison:  None.  Findings:  Gallbladder:  No gallstones, gallbladder wall thickening, or pericholecystic fluid. Sonographic Murphy's sign is negative.  Common Bile Duct:  Within normal limits in caliber. Measures 3.7 mm.  Liver: No focal mass lesion identified.  Within normal limits in parenchymal echogenicity.  IVC:  Appears normal.  Pancreas:  Although the pancreas is difficult to visualize in its entirety, no focal pancreatic abnormality is identified. Marland Kitchen  Spleen:  Within normal limits in size and echotexture.  Right kidney:  Normal in size and parenchymal echogenicity.  No evidence of mass or hydronephrosis.  Left kidney:  Normal in size and parenchymal echogenicity.  No evidence of mass or hydronephrosis.  Abdominal Aorta:  No aneurysm identified. Distal abdominal aorta difficult to visualize due to overlying bowel gas.  IMPRESSION: Negative abdominal ultrasound.   Original Report Authenticated By: Britta Mccreedy, M.D.    Ct Abdomen Pelvis W Contrast  02/02/2012  *RADIOLOGY  REPORT*  Clinical Data: Clinical diagnosis of acute pancreatitis.  Recent heavy alcohol use.  Lipase of 01/1990.  Abdominal pain.  CT ABDOMEN AND PELVIS WITH CONTRAST  Technique:  Multidetector CT imaging of the abdomen and pelvis was performed following the standard protocol during bolus administration of intravenous contrast.  Contrast: OMNIPAQUE IOHEXOL 300 MG/ML  SOLN  Comparison: Abdominal ultrasound 02/02/2012  Findings: There are patchy areas of atelectasis in both lower lobes.  Negative for pleural effusion.  Imaged portion of the heart appears upper normal in size.  There is stranding in the peripancreatic fat adjacent to the pancreatic head, neck, and proximal body.  Extensive mesenteric fat stranding and fluid extends anterior and inferior to the pancreas and adjacent to the proximal transverse colon.  Additionally, there is a small to moderate volume of ascites in the dependent portion of the pelvis.  This inflammatory stranding and fluid is compatible with the given diagnosis of pancreatitis.  The pancreas itself enhances normally.  No evidence of pseudocyst or pancreatic necrosis.  There is no pancreatic calcification or pancreatic ductal dilatation.  The  liver, gallbladder, spleen, adrenal glands, and kidneys are within normal limits.  The stomach is well distended with oral contrast and appears normal.  There is moderate colonic diverticulosis. Probable vaginal contraceptive ring noted.  Urinary bladder within normal limits. Uterus and ovaries within normal limits for premenopausal patient.  Abdominal aorta normal in caliber.  Reactive size lymph nodes in the right lower quadrant ileocolic mesentery.  No pathologic lymphadenopathy in the abdomen or pelvis.  No acute or suspicious bony abnormality is identified.  IMPRESSION: Inflammatory stranding and fluid adjacent to the proximal pancreas and extending inferiorly in the right abdomen, with small to moderate ascites in the dependent portion the  pelvis.  Findings are compatible with the clinical and laboratory diagnosis of pancreatitis. 2.  No evidence of pancreatic necrosis, pseudocyst, or abscess. 3. Colonic diverticulosis. 4.  Probable vaginal contraceptive ring.   Original Report Authenticated By: Britta Mccreedy, M.D.     Scheduled Meds:   . sodium chloride   Intravenous STAT  . docusate sodium  100 mg Oral BID  . enoxaparin (LOVENOX) injection  40 mg Subcutaneous Q24H  . folic acid  1 mg Oral Daily  . LORazepam  0-4 mg Intravenous Q6H   Followed by  . LORazepam  0-4 mg Intravenous Q12H  . multivitamin with minerals  1 tablet Oral Daily  . nicotine  21 mg Transdermal Once  . pantoprazole (PROTONIX) IV  40 mg Intravenous Q24H  . potassium chloride  10 mEq Intravenous Q1 Hr x 5  . sodium chloride  3 mL Intravenous Q12H  . thiamine  100 mg Oral Daily   Or  . thiamine  100 mg Intravenous Daily   Continuous Infusions:   . sodium chloride      Principal Problem:  *Pancreatitis, acute Active Problems:  Generalized anxiety disorder  Substance abuse  Hypotension  Bipolar disorder  Tobacco abuse  ETOH abuse  Tachycardia  Dehydration  Leukocytosis  Scoliosis    Time spent: > 30 mins    [facility]  [facility] Pager [phone]. If 8PM-8AM, please contact night-coverage at [email or web address], password [facility] 02/03/2012, 9:33 AM  LOS: 1 day

## 2012-02-03 NOTE — Progress Notes (Signed)
Pt reported having blood in toilet on urination. She is unsure if it is due to her menstrual cycle. Will continue to monitor. Julio Sicks RN
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
## 2012-02-04 DIAGNOSIS — D72829 Elevated white blood cell count, unspecified: Secondary | ICD-10-CM

## 2012-02-04 LAB — RAPID URINE DRUG SCREEN, HOSP PERFORMED
Barbiturates: NOT DETECTED
Cocaine: NOT DETECTED

## 2012-02-04 LAB — CBC
Hemoglobin: 10.9 g/dL — ABNORMAL LOW (ref 12.0–15.0)
MCH: 29.9 pg (ref 26.0–34.0)
MCHC: 34.6 g/dL (ref 30.0–36.0)
RDW: 15.9 % — ABNORMAL HIGH (ref 11.5–15.5)

## 2012-02-04 LAB — BASIC METABOLIC PANEL
BUN: 3 mg/dL — ABNORMAL LOW (ref 6–23)
Calcium: 8.4 mg/dL (ref 8.4–10.5)
Chloride: 104 mEq/L (ref 96–112)
Creatinine, Ser: 0.81 mg/dL (ref 0.50–1.10)
GFR calc Af Amer: >90 mL/min (ref >90)

## 2012-02-04 LAB — MAGNESIUM: Magnesium: 1.9 mg/dL (ref 1.5–2.5)

## 2012-02-04 MED ORDER — SODIUM CHLORIDE 0.9 % IV BOLUS (SEPSIS)
1000.0000 mL | Freq: Once | INTRAVENOUS | Status: AC
  Administered 2012-02-04: 1000 mL via INTRAVENOUS

## 2012-02-04 NOTE — Progress Notes (Signed)
Attempted to meet with Pt.  MD in with Pt and family.  CSW to attempt to meet with Pt tomorrow.  Providence Crosby, LCSWA Clinical Social Work [phone]

## 2012-02-04 NOTE — Clinical Documentation Improvement (Signed)
GENERIC DOCUMENTATION CLARIFICATION QUERY  THIS DOCUMENT IS NOT A PERMANENT PART OF THE MEDICAL RECORD  TO RESPOND TO THE THIS QUERY, FOLLOW THE INSTRUCTIONS BELOW:  1. If needed, update documentation for the patient's encounter via the notes activity.  2. Access this query again and click edit on the In [employer].  3. After updating, or not, click F2 to complete all highlighted (required) fields concerning your review. Select "additional documentation in the medical record" OR "no additional documentation provided".  4. Click Sign note button.  5. The deficiency will fall out of your In Basket *Please let us know if you are not able to complete this workflow by phone or e-mail (listed below).  Please update your documentation within the medical record to reflect your response to this query.                                                                                        02/04/12   Dear Dr. Janee Morn, D / Associates,  In a better effort to capture your patient's severity of illness, reflect appropriate length of stay and utilization of resources, a review of the patient medical record has revealed the following indicators.    Based on your clinical judgment, please clarify and document in a progress note and/or discharge summary the clinical condition associated with the following supporting information:  In responding to this query please exercise your independent judgment.  The fact that a query is asked, does not imply that any particular answer is desired or expected.   In this patient with pancreatitis a review of the medical records reveals the following:   Ascites per CT ABD pelvis w/ contrast on 02/02/12  Inflammatory stranding and fluid adjacent to the proximal pancreas per CT ABD pelvis w/ contrast on 02/02/12  Clarification Needed   Please clarify if you agree pt with ascites or other underlying diagenesis responsible for abnormal CT abn and document in pn or d/c  summary.  Thank you for all that you do for our patients!     Possible Clinical Conditions?   _______Other Condition__________________ _______Cannot Clinically Determine   Supporting Information:  Risk Factors:  Signs & Symptoms: Pancreatitis, ETOH, substance abuse,    Diagnostics: CT ABD pelvis w/ contrast on 02/02/12 IMPRESSION: Inflammatory stranding and fluid adjacent to the proximal pancreas and extending inferiorly in the right abdomen, with small to moderate ascites in the dependent portion the pelvis.  Findings are compatible with the clinical and laboratory diagnosis of pancreatitis. 2.  No evidence of pancreatic necrosis, pseudocyst, or abscess. 3. Colonic diverticulosis. 4.  Probable vaginal contraceptive ring.   Original Report Authenticated By: Britta Mccreedy, M.D.    Treatment  Monitoring  gi cocktail (Maalox,Lidocaine,Donnatal)      You may use possible, probable, or suspect with inpatient documentation. possible, probable, suspected diagnoses MUST be documented at the time of discharge  Reviewed:  no additional documentation provided ljh  Thank You,  Enis Slipper  RN, BSN, MSN/Inf, CCDS Clinical Documentation Specialist [facility] Dept Pager: [phone] / E-mail: [email or web address]  Health Information Management [place]

## 2012-02-04 NOTE — Progress Notes (Signed)
[facility] PROGRESS NOTE  [name] WGN:[id number] DOB: 1980-09-01 DOA: 02/02/2012 PCP: No primary provider on file.  Assessment/Plan:  #1 acute pancreatitis  Likely alcohol induced. Patient does have a history of alcohol use. Alcohol level is less than 11. Abdominal ultrasound is negative for gallstones. Patient with normal LFTs. CT of the abdomen and pelvis consistent with acute pancreatitis. Patient still with significant abdominal pain. Patient was started on clear liquids today however nursing states patient still with abdominal pain. Will make patient n.p.o. again. Continue IV fluids. Give another liter bolus. IV pain medication. Supportive care. Follow.  #2 borderline hypotension  Likely secondary to volume depletion. Patient does have a leukocytosis however urinalysis is negative for UTI. Chest x-ray is negative. Improving with IV fluids. Follow.  #3 dehydration  IV fluids.  #4 generalized anxiety disorder  Ativan as needed.  #5 bipolar disorder  Patient stated she stopped taking her medications about 2-3 months ago secondary to increased lethargy and drowsiness. Patient states has a child at home and as such cannot be drowsy. Patient has been seen by psychiatry and patient is reluctant to try any other SSRIs or other antipsychotic medications other than benzos. Will check a UDS. Psychiatry recommends drug rehabilitation and outpatient psych followup after discharge. Will have psych social worker aid in this process.  #6 tobacco abuse  Tobacco cessation. Continue nicotine patch.  #7 history of alcohol use  Will replete magnesium.  Continue Ativan CIWA protocol. Continue thiamine and folic acid. #8 leukocytosis  Likely a reactive leukocytosis secondary to problem #1. Urinalysis is not consistent with a UTI. Chest x-ray negative. Leukocytosis is slowly trending down. No need for antibiotics at this time. Follow. #9 tachycardia  Likely secondary to dehydration volume depletion to  problem #1. EKG with normal sinus rhythm. Continue IV fluids and supportive care. Follow.  #10 prophylaxis  PPI for GI prophylaxis. Lovenox for DVT prophylaxis.      Code Status: Full Family Communication: Updated patient at bedside Disposition Plan: Home when medically stable.   Consultants:    Procedures:  CT abdomen and pelvis 02/02/2012  Antibiotics:  None  HPI/Subjective: Patient still complaining of abdominal pain however states it is improving. Patient started on clears this morning, however nursing stating patient still c/o abdominal pain. Patient refusing any psch medications. Patient also c/o chronic back pain.  Objective: Filed Vitals:   02/03/12 1316 02/03/12 2239 02/04/12 0618 02/04/12 1325  BP: 101/61 114/74 121/82 120/79  Pulse: 111 104 107 105  Temp: 98.8 F (37.1 C) 99.1 F (37.3 C) 98.6 F (37 C) 97.8 F (36.6 C)  TempSrc: Oral Oral Oral Oral  Resp: 18 16 16 20   Height:      Weight:   79.652 kg (175 lb 9.6 oz)   SpO2: 98% 94% 100% 98%    Intake/Output Summary (Last 24 hours) at 02/04/12 1457 Last data filed at 02/04/12 1325  Gross per 24 hour  Intake 3208.5 ml  Output   1325 ml  Net 1883.5 ml   Filed Weights   02/02/12 0646 02/02/12 1740 02/04/12 0618  Weight: 75.751 kg (167 lb) 75.7 kg (166 lb 14.2 oz) 79.652 kg (175 lb 9.6 oz)    Exam:   General:  Sleeping. Easily arousable.  Cardiovascular: Tachycardic regular rhythm  Respiratory: Clear to auscultation bilaterally.  Abdomen: Soft, tender to palpation in the epigastrium, positive bowel sounds, nondistended.  Data Reviewed: Basic Metabolic Panel:  Lab 02/04/12 7846 02/03/12 0445 02/02/12 1836 02/02/12 0700  NA  136 134* -- 131*  K 3.6 3.3* -- 3.7  CL 104 102 -- 96  CO2 22 21 -- 23  GLUCOSE 104* 88 -- 123*  BUN 3* 4* -- 8  CREATININE 0.81 0.76 -- 0.82  CALCIUM 8.4 9.0 -- 9.5  MG 1.9 1.6 1.6 --  PHOS -- -- -- --   Liver Function Tests:  Lab 02/03/12 0445 02/02/12 0700   AST 10 16  ALT 7 11  ALKPHOS 63 67  BILITOT 2.2* 2.4*  PROT 5.8* 7.5  ALBUMIN 2.6* 3.9    Lab 02/04/12 0505 02/03/12 0445 02/02/12 0700  LIPASE 39 59 192*  AMYLASE -- -- --   No results found for this basename: AMMONIA:5 in the last 168 hours CBC:  Lab 02/04/12 0505 02/03/12 0445 02/02/12 0700  WBC 13.5* 17.3* 21.0*  NEUTROABS -- 14.3* 17.4*  HGB 10.9* 11.8* 14.9  HCT 31.5* 33.7* 42.2  MCV 86.5 85.5 85.4  PLT 225 205 277   Cardiac Enzymes: No results found for this basename: CKTOTAL:5,CKMB:5,CKMBINDEX:5,TROPONINI:5 in the last 168 hours BNP (last 3 results) No results found for this basename: PROBNP:3 in the last 8760 hours CBG: No results found for this basename: GLUCAP:5 in the last 168 hours  Recent Results (from the past 240 hour(s))  URINE CULTURE     Status: Normal (Preliminary result)   Collection Time   02/02/12  6:00 PM      Component Value Range Status Comment   Specimen Description URINE, CATHETERIZED   Final    Special Requests NONE   Final    Culture  Setup Time 02/03/2012 11:45   Final    Colony Count PENDING   Incomplete    Culture Culture reincubated for better growth   Final    Report Status PENDING   Incomplete      Studies: No results found.  Scheduled Meds:    . docusate sodium  100 mg Oral BID  . enoxaparin (LOVENOX) injection  40 mg Subcutaneous Q24H  . folic acid  1 mg Oral Daily  . LORazepam  0-4 mg Intravenous Q12H  . multivitamin with minerals  1 tablet Oral Daily  . nicotine  21 mg Transdermal Daily  . pantoprazole (PROTONIX) IV  40 mg Intravenous Q24H  . sodium chloride  3 mL Intravenous Q12H  . thiamine  100 mg Oral Daily   Or  . thiamine  100 mg Intravenous Daily   Continuous Infusions:    . sodium chloride 1,000 mL (02/04/12 0841)    Principal Problem:  *Pancreatitis, acute Active Problems:  Generalized anxiety disorder  Substance abuse  Hypotension  Bipolar disorder  Tobacco abuse  ETOH abuse  Tachycardia   Dehydration  Leukocytosis  Scoliosis    Time spent: > 30 mins    [facility]  [facility] Pager [phone]. If 8PM-8AM, please contact night-coverage at [email or web address], password [facility] 02/04/2012, 2:57 PM  LOS: 2 days

## 2012-02-05 LAB — URINE CULTURE

## 2012-02-05 LAB — BASIC METABOLIC PANEL
CO2: 23 mEq/L (ref 19–32)
Chloride: 107 mEq/L (ref 96–112)
Creatinine, Ser: 0.73 mg/dL (ref 0.50–1.10)
Sodium: 138 mEq/L (ref 135–145)

## 2012-02-05 LAB — LIPASE, BLOOD: Lipase: 97 U/L — ABNORMAL HIGH (ref 11–59)

## 2012-02-05 NOTE — Care Management Note (Unsigned)
    Page 1 of 1   02/07/2012     5:01:39 PM   CARE MANAGEMENT NOTE 02/07/2012  Patient:  [name], [name]   Account Number:  [id number]  Date Initiated:  02/05/2012  Documentation initiated by:  Lanier Clam  Subjective/Objective Assessment:   Admitted w/abd pain.acute pancreatitis     Action/Plan:   from home   Anticipated DC Date:  02/08/2012   Anticipated DC Plan:  HOME/SELF CARE  In-house referral  PCP / [facility]      DC Planning Services  CM consult      Choice offered to / List presented to:             Status of service:  In process, will continue to follow Medicare Important Message given?   (If response is "NO", the following Medicare IM given date fields will be blank) Date Medicare IM given:   Date Additional Medicare IM given:    Discharge Disposition:    Per UR Regulation:  Reviewed for med. necessity/level of care/duration of stay  If discussed at Long Length of Stay Meetings, dates discussed:    Comments:  02/07/12 [facility] RN,BSN NCM [phone] PROVIDED W/PCP-[facility]-WALK IN.  02/05/12 Breyah Akhter rn,bsn ncm [phone] provided w/pcp see d/c follow up.

## 2012-02-05 NOTE — Progress Notes (Signed)
[facility] PROGRESS NOTE  [name] WUJ:[id number] DOB: 09-14-1980 DOA: 02/02/2012 PCP: No primary provider on file.  Assessment/Plan: #1 acute pancreatitis  Likely alcohol induced. Patient does have a history of alcohol use. Alcohol level is less than 11. Abdominal ultrasound is negative for gallstones. Patient with normal LFTs. CT of the abdomen and pelvis consistent with acute pancreatitis. Patient still with significant abdominal pain. Patient was started on clear liquids today however nursing states patient still with abdominal pain 02/04/12. Will make patient n.p.o. again. Continue IV fluids. Give another liter bolus. IV pain medication. Supportive care.  -02/05/12 restart clear liquid diet #2 borderline hypotension  -improved -continue IVF Likely secondary to volume depletion. Patient does have a leukocytosis however urinalysis is negative for UTI. Chest x-ray is negative. Improving with IV fluids. Follow.  #3 dehydration  IV fluids.  #4 generalized anxiety disorder  Ativan as needed.  #5 bipolar disorder  Patient stated she stopped taking her medications about 2-3 months ago secondary to increased lethargy and drowsiness. Patient states has a child at home and as such cannot be drowsy. Patient has been seen by psychiatry and patient is reluctant to try any other SSRIs or other antipsychotic medications other than benzos. Will check a UDS. Psychiatry recommends drug rehabilitation and outpatient psych followup after discharge. Will have psych social worker aid in this process.  #6 tobacco abuse  Tobacco cessation. Continue nicotine patch.  #7 history of alcohol use  Will replete magnesium. Continue Ativan CIWA protocol. Continue thiamine and folic acid.  #8 leukocytosis  Likely a reactive leukocytosis secondary to problem #1. Urinalysis is not consistent with a UTI. Chest x-ray negative. Leukocytosis is slowly trending down. No need for antibiotics at this time. Follow.  #9  tachycardia  Likely secondary to dehydration volume depletion to problem #1. EKG with normal sinus rhythm. Continue IV fluids and supportive care. Follow.  #10 prophylaxis  PPI for GI prophylaxis. Lovenox for DVT prophylaxis.       Family Communication:   Pt at beside Disposition Plan:   Home when medically stable     Procedures/Studies: Dg Chest 2 View  02/02/2012  *RADIOLOGY REPORT*  Clinical Data: Leukocytosis.  Sternal pain.  CHEST - 2 VIEW  Comparison: None  Findings: The cardiac silhouette, mediastinal and hilar contours are within normal limits.  There is streaky bibasilar subsegmental atelectasis without definite infiltrate or effusion.  The bony thorax is intact.  IMPRESSION: Bibasilar subsegmental atelectasis.   Original Report Authenticated By: Rudie Meyer, M.D.    US Abdomen Complete  02/02/2012  *RADIOLOGY REPORT*  Clinical Data:  Abdominal pain  ABDOMINAL ULTRASOUND COMPLETE  Comparison:  None.  Findings:  Gallbladder:  No gallstones, gallbladder wall thickening, or pericholecystic fluid. Sonographic Murphy's sign is negative.  Common Bile Duct:  Within normal limits in caliber. Measures 3.7 mm.  Liver: No focal mass lesion identified.  Within normal limits in parenchymal echogenicity.  IVC:  Appears normal.  Pancreas:  Although the pancreas is difficult to visualize in its entirety, no focal pancreatic abnormality is identified. Marland Kitchen  Spleen:  Within normal limits in size and echotexture.  Right kidney:  Normal in size and parenchymal echogenicity.  No evidence of mass or hydronephrosis.  Left kidney:  Normal in size and parenchymal echogenicity.  No evidence of mass or hydronephrosis.  Abdominal Aorta:  No aneurysm identified. Distal abdominal aorta difficult to visualize due to overlying bowel gas.  IMPRESSION: Negative abdominal ultrasound.   Original Report Authenticated By: Britta Mccreedy,  M.D.    Ct Abdomen Pelvis W Contrast  02/02/2012  *RADIOLOGY REPORT*  Clinical Data:  Clinical diagnosis of acute pancreatitis.  Recent heavy alcohol use.  Lipase of 01/1990.  Abdominal pain.  CT ABDOMEN AND PELVIS WITH CONTRAST  Technique:  Multidetector CT imaging of the abdomen and pelvis was performed following the standard protocol during bolus administration of intravenous contrast.  Contrast: OMNIPAQUE IOHEXOL 300 MG/ML  SOLN  Comparison: Abdominal ultrasound 02/02/2012  Findings: There are patchy areas of atelectasis in both lower lobes.  Negative for pleural effusion.  Imaged portion of the heart appears upper normal in size.  There is stranding in the peripancreatic fat adjacent to the pancreatic head, neck, and proximal body.  Extensive mesenteric fat stranding and fluid extends anterior and inferior to the pancreas and adjacent to the proximal transverse colon.  Additionally, there is a small to moderate volume of ascites in the dependent portion of the pelvis.  This inflammatory stranding and fluid is compatible with the given diagnosis of pancreatitis.  The pancreas itself enhances normally.  No evidence of pseudocyst or pancreatic necrosis.  There is no pancreatic calcification or pancreatic ductal dilatation.  The liver, gallbladder, spleen, adrenal glands, and kidneys are within normal limits.  The stomach is well distended with oral contrast and appears normal.  There is moderate colonic diverticulosis. Probable vaginal contraceptive ring noted.  Urinary bladder within normal limits. Uterus and ovaries within normal limits for premenopausal patient.  Abdominal aorta normal in caliber.  Reactive size lymph nodes in the right lower quadrant ileocolic mesentery.  No pathologic lymphadenopathy in the abdomen or pelvis.  No acute or suspicious bony abnormality is identified.  IMPRESSION: Inflammatory stranding and fluid adjacent to the proximal pancreas and extending inferiorly in the right abdomen, with small to moderate ascites in the dependent portion the pelvis.  Findings are  compatible with the clinical and laboratory diagnosis of pancreatitis. 2.  No evidence of pancreatic necrosis, pseudocyst, or abscess. 3. Colonic diverticulosis. 4.  Probable vaginal contraceptive ring.   Original Report Authenticated By: Britta Mccreedy, M.D.          Subjective: Patient is feeling better. Abdominal pain has improved. No nausea, vomiting, diarrhea. No fevers or chills. Denies chest pain, shortness breath, hematuria, dysuria.  Objective: Filed Vitals:   02/04/12 1325 02/04/12 2130 02/05/12 0656 02/05/12 1459  BP: 120/79 120/58 123/86 113/75  Pulse: 105 106 105 84  Temp: 97.8 F (36.6 C) 98.4 F (36.9 C)  98.2 F (36.8 C)  TempSrc: Oral Oral Oral Oral  Resp: 20 20 20 20   Height:      Weight:   80.3 kg (177 lb 0.5 oz)   SpO2: 98% 98% 96% 96%    Intake/Output Summary (Last 24 hours) at 02/05/12 1929 Last data filed at 02/05/12 1927  Gross per 24 hour  Intake   3540 ml  Output    700 ml  Net   2840 ml   Weight change: 0.648 kg (1 lb 6.9 oz) Exam:   General:  Pt is alert, follows commands appropriately, not in acute distress  HEENT: No icterus, No thrush,  [place]/AT  Cardiovascular: RRR, S1/S2, no rubs, no gallops  Respiratory: CTA bilaterally, no wheezing, no crackles, no rhonchi  Abdomen: Soft/+BS, mild epigastric tenderness without any peritoneal signs., non distended, no guarding  Extremities: No edema, No lymphangitis, No petechiae, No rashes, no synovitis  Data Reviewed: Basic Metabolic Panel:  Lab 02/05/12 3664 02/04/12 0505 02/03/12 0445 02/02/12  1836 02/02/12 0700  NA 138 136 134* -- 131*  K 3.5 3.6 3.3* -- 3.7  CL 107 104 102 -- 96  CO2 23 22 21  -- 23  GLUCOSE 94 104* 88 -- 123*  BUN <3* 3* 4* -- 8  CREATININE 0.73 0.81 0.76 -- 0.82  CALCIUM 8.5 8.4 9.0 -- 9.5  MG -- 1.9 1.6 1.6 --  PHOS -- -- -- -- --   Liver Function Tests:  Lab 02/03/12 0445 02/02/12 0700  AST 10 16  ALT 7 11  ALKPHOS 63 67  BILITOT 2.2* 2.4*  PROT 5.8* 7.5    ALBUMIN 2.6* 3.9    Lab 02/05/12 0545 02/04/12 0505 02/03/12 0445 02/02/12 0700  LIPASE 97* 39 59 192*  AMYLASE -- -- -- --   No results found for this basename: AMMONIA:5 in the last 168 hours CBC:  Lab 02/04/12 0505 02/03/12 0445 02/02/12 0700  WBC 13.5* 17.3* 21.0*  NEUTROABS -- 14.3* 17.4*  HGB 10.9* 11.8* 14.9  HCT 31.5* 33.7* 42.2  MCV 86.5 85.5 85.4  PLT 225 205 277   Cardiac Enzymes: No results found for this basename: CKTOTAL:5,CKMB:5,CKMBINDEX:5,TROPONINI:5 in the last 168 hours BNP: No components found with this basename: POCBNP:5 CBG: No results found for this basename: GLUCAP:5 in the last 168 hours  Recent Results (from the past 240 hour(s))  URINE CULTURE     Status: Normal   Collection Time   02/02/12  6:00 PM      Component Value Range Status Comment   Specimen Description URINE, CATHETERIZED   Final    Special Requests NONE   Final    Culture  Setup Time 02/03/2012 11:45   Final    Colony Count 20,OOO COLONIES/ML   Final    Culture     Final    Value: GROUP B STREP(S.AGALACTIAE)ISOLATED     Note: TESTING AGAINST S. AGALACTIAE NOT ROUTINELY PERFORMED DUE TO PREDICTABILITY OF AMP/PEN/VAN SUSCEPTIBILITY.   Report Status 02/05/2012 FINAL   Final      Scheduled Meds:   . docusate sodium  100 mg Oral BID  . enoxaparin (LOVENOX) injection  40 mg Subcutaneous Q24H  . folic acid  1 mg Oral Daily  . LORazepam  0-4 mg Intravenous Q12H  . multivitamin with minerals  1 tablet Oral Daily  . nicotine  21 mg Transdermal Daily  . pantoprazole (PROTONIX) IV  40 mg Intravenous Q24H  . sodium chloride  3 mL Intravenous Q12H  . thiamine  100 mg Oral Daily   Or  . thiamine  100 mg Intravenous Daily   Continuous Infusions:   . sodium chloride 1,000 mL (02/05/12 1019)     Nikolaos Maddocks, DO  [facility] Pager [phone]  If 7PM-7AM, please contact night-coverage [email or web address] Password [facility] 02/05/2012, 7:29 PM   LOS: 3 days

## 2012-02-06 DIAGNOSIS — E876 Hypokalemia: Secondary | ICD-10-CM

## 2012-02-06 LAB — BASIC METABOLIC PANEL
CO2: 24 mEq/L (ref 19–32)
Chloride: 105 mEq/L (ref 96–112)
Sodium: 139 mEq/L (ref 135–145)

## 2012-02-06 LAB — CBC
HCT: 31.2 % — ABNORMAL LOW (ref 36.0–46.0)
Hemoglobin: 10.8 g/dL — ABNORMAL LOW (ref 12.0–15.0)
MCV: 85 fL (ref 78.0–100.0)
RBC: 3.67 MIL/uL — ABNORMAL LOW (ref 3.87–5.11)
WBC: 10.1 10*3/uL (ref 4.0–10.5)

## 2012-02-06 MED ORDER — POTASSIUM CHLORIDE 20 MEQ/15ML (10%) PO LIQD
40.0000 meq | Freq: Once | ORAL | Status: AC
  Administered 2012-02-06: 40 meq via ORAL
  Filled 2012-02-06: qty 30

## 2012-02-06 NOTE — Progress Notes (Addendum)
Met with Pt to discuss outpt providers.  Pt was groggy and had difficulty keeping her eyes open.  While pleasant, Pt stated that she is not interested in seeing anyone other than a PCP, as she had a bad experience with psych meds.  Pt stated that she didn't need CSW's services but agreed to receiving information on [facility].  CSW provided Pt with [facility] information and discussed with Pt how to begin receiving services at this facility.  Pt asked for PCP information.  CSW to notify RNCM.  CSW thanked Pt for her time.  Notified RNCM that Pt needs PCP information.  No further psych needs.   Psych CSW to sign off.  RNCM to follow up with Pt.  Providence Crosby, LCSWA Clinical Social Work [phone]

## 2012-02-06 NOTE — Progress Notes (Signed)
Pt found in bathroom with door shut by the NT, Ingrid. According to Jerene Dilling, pt's boyfriend in the room said she was brushing her teeth, but Jerene Dilling heard shower running. When boyfriend asked about shower, he stated she was cleaning up because she had her period. Patient took monitor off before going to bathroom. Will monitor patient and place back on tele. - Christell Faith, RN

## 2012-02-06 NOTE — Progress Notes (Signed)
[facility] PROGRESS NOTE  [name] JYN:[id number] DOB: 04-21-1980 DOA: 02/02/2012 PCP: No primary provider on file.  Assessment/Plan: #1 acute pancreatitis  Likely alcohol induced. Patient does have a history of alcohol use. Alcohol level is less than 11. Abdominal ultrasound is negative for gallstones. Patient with normal LFTs. CT of the abdomen and pelvis consistent with acute pancreatitis.  -Abdominal pain is improving -Tolerated the liquids last 24 hours (02/05/12) Patient was started on clear liquids today however nursing states patient still with abdominal pain 02/04/12--> patient n.p.o. again. Continue IV fluids.  Supportive care.  -02/05/12 restart clear liquid diet  #2 borderline hypotension  -improved  -continue IVF  Likely secondary to volume depletion. Patient does have a leukocytosis however urinalysis is negative for UTI. Chest x-ray is negative. Improving with IV fluids. Follow.  #3 dehydration  IV fluids.  #4 generalized anxiety disorder  Ativan as needed.  #5 bipolar disorder  Patient stated she stopped taking her medications about 2-3 months ago secondary to increased lethargy and drowsiness. Patient states has a child at home and as such cannot be drowsy. Patient has been seen by psychiatry and patient is reluctant to try any other SSRIs or other antipsychotic medications other than benzos. Will check a UDS. Psychiatry recommends drug rehabilitation and outpatient psych followup after discharge. Will have psych social worker aid in this process.  #6 tobacco abuse  Tobacco cessation. Continue nicotine patch.  #7 history of alcohol use  Will replete magnesium. Continue Ativan CIWA protocol. Continue thiamine and folic acid.  #8 leukocytosis  -improving Likely a reactive leukocytosis secondary to problem #1. Urinalysis is not consistent with a UTI. Chest x-ray negative. Leukocytosis is slowly trending down. No need for antibiotics at this time. Follow.  #9  tachycardia  Likely secondary to dehydration volume depletion to problem #1. EKG with normal sinus rhythm. Continue IV fluids and supportive care. Follow.  #10 prophylaxis  PPI for GI prophylaxis. Lovenox for DVT prophylaxis. Hypokalemia -Replete -Check magnesium     Family Communication:   Pt at beside Disposition Plan:   Home when medically stable      Procedures/Studies: Dg Chest 2 View  02/02/2012  *RADIOLOGY REPORT*  Clinical Data: Leukocytosis.  Sternal pain.  CHEST - 2 VIEW  Comparison: None  Findings: The cardiac silhouette, mediastinal and hilar contours are within normal limits.  There is streaky bibasilar subsegmental atelectasis without definite infiltrate or effusion.  The bony thorax is intact.  IMPRESSION: Bibasilar subsegmental atelectasis.   Original Report Authenticated By: Rudie Meyer, M.D.    US Abdomen Complete  02/02/2012  *RADIOLOGY REPORT*  Clinical Data:  Abdominal pain  ABDOMINAL ULTRASOUND COMPLETE  Comparison:  None.  Findings:  Gallbladder:  No gallstones, gallbladder wall thickening, or pericholecystic fluid. Sonographic Murphy's sign is negative.  Common Bile Duct:  Within normal limits in caliber. Measures 3.7 mm.  Liver: No focal mass lesion identified.  Within normal limits in parenchymal echogenicity.  IVC:  Appears normal.  Pancreas:  Although the pancreas is difficult to visualize in its entirety, no focal pancreatic abnormality is identified. Marland Kitchen  Spleen:  Within normal limits in size and echotexture.  Right kidney:  Normal in size and parenchymal echogenicity.  No evidence of mass or hydronephrosis.  Left kidney:  Normal in size and parenchymal echogenicity.  No evidence of mass or hydronephrosis.  Abdominal Aorta:  No aneurysm identified. Distal abdominal aorta difficult to visualize due to overlying bowel gas.  IMPRESSION: Negative abdominal ultrasound.   Original  Report Authenticated By: Britta Mccreedy, M.D.    Ct Abdomen Pelvis W Contrast  02/02/2012   *RADIOLOGY REPORT*  Clinical Data: Clinical diagnosis of acute pancreatitis.  Recent heavy alcohol use.  Lipase of 01/1990.  Abdominal pain.  CT ABDOMEN AND PELVIS WITH CONTRAST  Technique:  Multidetector CT imaging of the abdomen and pelvis was performed following the standard protocol during bolus administration of intravenous contrast.  Contrast: OMNIPAQUE IOHEXOL 300 MG/ML  SOLN  Comparison: Abdominal ultrasound 02/02/2012  Findings: There are patchy areas of atelectasis in both lower lobes.  Negative for pleural effusion.  Imaged portion of the heart appears upper normal in size.  There is stranding in the peripancreatic fat adjacent to the pancreatic head, neck, and proximal body.  Extensive mesenteric fat stranding and fluid extends anterior and inferior to the pancreas and adjacent to the proximal transverse colon.  Additionally, there is a small to moderate volume of ascites in the dependent portion of the pelvis.  This inflammatory stranding and fluid is compatible with the given diagnosis of pancreatitis.  The pancreas itself enhances normally.  No evidence of pseudocyst or pancreatic necrosis.  There is no pancreatic calcification or pancreatic ductal dilatation.  The liver, gallbladder, spleen, adrenal glands, and kidneys are within normal limits.  The stomach is well distended with oral contrast and appears normal.  There is moderate colonic diverticulosis. Probable vaginal contraceptive ring noted.  Urinary bladder within normal limits. Uterus and ovaries within normal limits for premenopausal patient.  Abdominal aorta normal in caliber.  Reactive size lymph nodes in the right lower quadrant ileocolic mesentery.  No pathologic lymphadenopathy in the abdomen or pelvis.  No acute or suspicious bony abnormality is identified.  IMPRESSION: Inflammatory stranding and fluid adjacent to the proximal pancreas and extending inferiorly in the right abdomen, with small to moderate ascites in the dependent  portion the pelvis.  Findings are compatible with the clinical and laboratory diagnosis of pancreatitis. 2.  No evidence of pancreatic necrosis, pseudocyst, or abscess. 3. Colonic diverticulosis. 4.  Probable vaginal contraceptive ring.   Original Report Authenticated By: Britta Mccreedy, M.D.          Subjective: Patient states that abdominal pain is 75% better than the time of admission. She denies any fevers, chills, chest pain, shortness breath, nausea, vomiting, diarrhea. She has some abdominal pain. No dysuria or hematuria. No headache or dizziness.  Objective: Filed Vitals:   02/05/12 2341 02/06/12 0634 02/06/12 0700 02/06/12 1400  BP: 117/65 113/73  107/66  Pulse: 77 84  79  Temp: 98.6 F (37 C) 98.6 F (37 C)  98.1 F (36.7 C)  TempSrc: Oral Oral  Oral  Resp: 20 20  20   Height:      Weight:   79.107 kg (174 lb 6.4 oz)   SpO2: 98% 97%  98%    Intake/Output Summary (Last 24 hours) at 02/06/12 1709 Last data filed at 02/06/12 1500  Gross per 24 hour  Intake   4080 ml  Output    900 ml  Net   3180 ml   Weight change: -1.193 kg (-2 lb 10.1 oz) Exam:   General:  Pt is alert, follows commands appropriately, not in acute distress  HEENT: No icterus, No thrush,[place]/AT  Cardiovascular: RRR, S1/S2, no rubs, no gallops  Respiratory: CTA bilaterally, no wheezing, no crackles, no rhonchi  Abdomen: Soft/+BS, mild right upper quadrant tenderness without any rebound or peritoneal signs, non distended, no guarding  Extremities: trace  edema,  No lymphangitis, No petechiae, No rashes, no synovitis  Data Reviewed: Basic Metabolic Panel:  Lab 02/06/12 1610 02/05/12 0545 02/04/12 0505 02/03/12 0445 02/02/12 1836 02/02/12 0700  NA 139 138 136 134* -- 131*  K 3.3* 3.5 3.6 3.3* -- 3.7  CL 105 107 104 102 -- 96  CO2 24 23 22 21  -- 23  GLUCOSE 111* 94 104* 88 -- 123*  BUN <3* <3* 3* 4* -- 8  CREATININE 0.66 0.73 0.81 0.76 -- 0.82  CALCIUM 8.1* 8.5 8.4 9.0 -- 9.5  MG -- -- 1.9  1.6 1.6 --  PHOS -- -- -- -- -- --   Liver Function Tests:  Lab 02/03/12 0445 02/02/12 0700  AST 10 16  ALT 7 11  ALKPHOS 63 67  BILITOT 2.2* 2.4*  PROT 5.8* 7.5  ALBUMIN 2.6* 3.9    Lab 02/05/12 0545 02/04/12 0505 02/03/12 0445 02/02/12 0700  LIPASE 97* 39 59 192*  AMYLASE -- -- -- --   No results found for this basename: AMMONIA:5 in the last 168 hours CBC:  Lab 02/06/12 0519 02/04/12 0505 02/03/12 0445 02/02/12 0700  WBC 10.1 13.5* 17.3* 21.0*  NEUTROABS -- -- 14.3* 17.4*  HGB 10.8* 10.9* 11.8* 14.9  HCT 31.2* 31.5* 33.7* 42.2  MCV 85.0 86.5 85.5 85.4  PLT 279 225 205 277   Cardiac Enzymes: No results found for this basename: CKTOTAL:5,CKMB:5,CKMBINDEX:5,TROPONINI:5 in the last 168 hours BNP: No components found with this basename: POCBNP:5 CBG: No results found for this basename: GLUCAP:5 in the last 168 hours  Recent Results (from the past 240 hour(s))  URINE CULTURE     Status: Normal   Collection Time   02/02/12  6:00 PM      Component Value Range Status Comment   Specimen Description URINE, CATHETERIZED   Final    Special Requests NONE   Final    Culture  Setup Time 02/03/2012 11:45   Final    Colony Count 20,OOO COLONIES/ML   Final    Culture     Final    Value: GROUP B STREP(S.AGALACTIAE)ISOLATED     Note: TESTING AGAINST S. AGALACTIAE NOT ROUTINELY PERFORMED DUE TO PREDICTABILITY OF AMP/PEN/VAN SUSCEPTIBILITY.   Report Status 02/05/2012 FINAL   Final      Scheduled Meds:   . docusate sodium  100 mg Oral BID  . enoxaparin (LOVENOX) injection  40 mg Subcutaneous Q24H  . folic acid  1 mg Oral Daily  . multivitamin with minerals  1 tablet Oral Daily  . nicotine  21 mg Transdermal Daily  . pantoprazole (PROTONIX) IV  40 mg Intravenous Q24H  . potassium chloride  40 mEq Oral Once  . sodium chloride  3 mL Intravenous Q12H  . thiamine  100 mg Oral Daily   Or  . thiamine  100 mg Intravenous Daily   Continuous Infusions:   . sodium chloride 1,000 mL  (02/06/12 1422)     Alison Strauss, DO  [facility] Pager [phone]  If 7PM-7AM, please contact night-coverage [email or web address] Password [email or web address] 02/06/2012, 5:09 PM   LOS: 4 days

## 2012-02-06 NOTE — Progress Notes (Signed)
Pt placed back in bed with tele monitor on. Pt confirmed she took a shower. RN checked IV site and dressings. IV dressing and tape clean, dry, and intact. Pt states she kept arm with IV out of the shower. Pt now resting with no complaints or signs of distress. Will continue to monitor. - Christell Faith, RN

## 2012-02-07 ENCOUNTER — Inpatient Hospital Stay (HOSPITAL_COMMUNITY): Payer: Medicare Other

## 2012-02-07 LAB — CBC
HCT: 29.8 % — ABNORMAL LOW (ref 36.0–46.0)
MCHC: 35.2 g/dL (ref 30.0–36.0)
MCV: 84.9 fL (ref 78.0–100.0)
RDW: 15.1 % (ref 11.5–15.5)
WBC: 9.1 10*3/uL (ref 4.0–10.5)

## 2012-02-07 LAB — BASIC METABOLIC PANEL
BUN: 4 mg/dL — ABNORMAL LOW (ref 6–23)
CO2: 25 mEq/L (ref 19–32)
Chloride: 105 mEq/L (ref 96–112)
Creatinine, Ser: 0.72 mg/dL (ref 0.50–1.10)

## 2012-02-07 MED ORDER — OXYCODONE HCL 5 MG PO TABS
5.0000 mg | ORAL_TABLET | ORAL | Status: DC | PRN
  Administered 2012-02-07 (×2): 5 mg via ORAL
  Filled 2012-02-07 (×2): qty 1

## 2012-02-07 MED ORDER — PANTOPRAZOLE SODIUM 40 MG PO TBEC
40.0000 mg | DELAYED_RELEASE_TABLET | Freq: Every day | ORAL | Status: DC
  Administered 2012-02-07 – 2012-02-08 (×2): 40 mg via ORAL
  Filled 2012-02-07 (×2): qty 1

## 2012-02-07 MED ORDER — MORPHINE SULFATE 2 MG/ML IJ SOLN
2.0000 mg | Freq: Once | INTRAMUSCULAR | Status: AC
  Administered 2012-02-07: 2 mg via INTRAVENOUS

## 2012-02-07 MED ORDER — OXYCODONE HCL 5 MG PO TABS
10.0000 mg | ORAL_TABLET | Freq: Once | ORAL | Status: AC
  Administered 2012-02-07: 10 mg via ORAL

## 2012-02-07 MED ORDER — OXYCODONE HCL 5 MG PO TABS
10.0000 mg | ORAL_TABLET | ORAL | Status: DC | PRN
  Administered 2012-02-08 (×4): 10 mg via ORAL
  Filled 2012-02-07 (×5): qty 2

## 2012-02-07 MED ORDER — LORAZEPAM 0.5 MG PO TABS
0.5000 mg | ORAL_TABLET | Freq: Four times a day (QID) | ORAL | Status: DC | PRN
  Administered 2012-02-07 – 2012-02-08 (×2): 0.5 mg via ORAL
  Filled 2012-02-07 (×2): qty 1

## 2012-02-07 MED ORDER — ACETAMINOPHEN 325 MG PO TABS
650.0000 mg | ORAL_TABLET | Freq: Four times a day (QID) | ORAL | Status: DC | PRN
  Administered 2012-02-07: 650 mg via ORAL
  Filled 2012-02-07: qty 2

## 2012-02-07 MED ORDER — CALCIUM CARBONATE ANTACID 500 MG PO CHEW
1.0000 | CHEWABLE_TABLET | Freq: Two times a day (BID) | ORAL | Status: DC | PRN
  Filled 2012-02-07 (×2): qty 1

## 2012-02-07 NOTE — Progress Notes (Signed)
  Pharmacy Note - IV --> PO Conversion  This patient is receiving IV Protonix. Based on criteria approved by the Pharmacy and Therapeutics Committee, this medication is being converted to the equivalent oral dose form. These criteria include:   . The patient is eating (either orally or per tube) and/or has been taking other orally administered medications for at least 24 hours.  . This patient has no evidence of active gastrointestinal bleeding or impaired GI absorption (gastrectomy, short bowel, patient on TNA or NPO).   If you have questions about this conversion, please contact the pharmacy department.  Darrol Angel [place], [place] 02/07/2012 2:22 PM

## 2012-02-07 NOTE — Progress Notes (Signed)
NT, Jerene Dilling, went in to get daily weight on pt. Pt refused to get up for weight and requested they get weight later during day shift. Will pass on to day shift RN and continue to monitor. - Christell Faith, RN

## 2012-02-07 NOTE — Progress Notes (Signed)
[facility] PROGRESS NOTE  [name] BJY:[id number] DOB: May 29, 1980 DOA: 02/02/2012 PCP: No primary provider on file.  Assessment/Plan: #1 acute pancreatitis  Likely alcohol induced. Patient does have a history of alcohol use. Alcohol level is less than 11. Abdominal ultrasound is negative for gallstones. Patient with normal LFTs. CT of the abdomen and pelvis consistent with acute pancreatitis.  -Abdominal pain is improving  -Tolerated the liquids (02/05/12)  Patient was started on clear liquids today however nursing states patient still with abdominal pain 02/04/12--> patient n.p.o. again. Continue IV fluids. Supportive care.  -02/05/12 restart clear liquid diet  -had some abdominal pain after meals 02/07/12--wanted to stay another day -change opioids to po--02/07/12 #2 borderline hypotension  -improved  -continue IVF  Likely secondary to volume depletion. Patient does have a leukocytosis however urinalysis is negative for UTI. Chest x-ray is negative. Improving with IV fluids. Follow.  #3 dehydration  IV fluids-->improved #4 generalized anxiety disorder  Ativan as needed.  #5 bipolar disorder  Patient stated she stopped taking her medications about 2-3 months ago secondary to increased lethargy and drowsiness. Patient states has a child at home and as such cannot be drowsy. Patient has been seen by psychiatry and patient is reluctant to try any other SSRIs or other antipsychotic medications other than benzos. Will check a UDS. Psychiatry recommends drug rehabilitation and outpatient psych followup after discharge. Will have psych social worker aid in this process.  #6 tobacco abuse  Tobacco cessation. Continue nicotine patch.  #7 history of alcohol use  repleted magnesium. Continue Ativan CIWA protocol. Continue thiamine and folic acid.  #8 leukocytosis  -improved Likely a reactive leukocytosis secondary to problem #1. Urinalysis is not consistent with a UTI. Chest x-ray negative.  Leukocytosis is slowly trending down. No need for antibiotics at this time. Follow.  #9 tachycardia  -improved Likely secondary to dehydration volume depletion to problem #1. EKG with normal sinus rhythm. Continue IV fluids and supportive care. Follow.  #10 prophylaxis  PPI for GI prophylaxis. Lovenox for DVT prophylaxis.  Hypokalemia  -Repleted     Family Communication:   Pt at beside Disposition Plan:   Home 02/08/12 if medically stable    Procedures/Studies: Dg Chest 2 View  02/02/2012  *RADIOLOGY REPORT*  Clinical Data: Leukocytosis.  Sternal pain.  CHEST - 2 VIEW  Comparison: None  Findings: The cardiac silhouette, mediastinal and hilar contours are within normal limits.  There is streaky bibasilar subsegmental atelectasis without definite infiltrate or effusion.  The bony thorax is intact.  IMPRESSION: Bibasilar subsegmental atelectasis.   Original Report Authenticated By: Rudie Meyer, M.D.    US Abdomen Complete  02/02/2012  *RADIOLOGY REPORT*  Clinical Data:  Abdominal pain  ABDOMINAL ULTRASOUND COMPLETE  Comparison:  None.  Findings:  Gallbladder:  No gallstones, gallbladder wall thickening, or pericholecystic fluid. Sonographic Murphy's sign is negative.  Common Bile Duct:  Within normal limits in caliber. Measures 3.7 mm.  Liver: No focal mass lesion identified.  Within normal limits in parenchymal echogenicity.  IVC:  Appears normal.  Pancreas:  Although the pancreas is difficult to visualize in its entirety, no focal pancreatic abnormality is identified. Marland Kitchen  Spleen:  Within normal limits in size and echotexture.  Right kidney:  Normal in size and parenchymal echogenicity.  No evidence of mass or hydronephrosis.  Left kidney:  Normal in size and parenchymal echogenicity.  No evidence of mass or hydronephrosis.  Abdominal Aorta:  No aneurysm identified. Distal abdominal aorta difficult to visualize due to  overlying bowel gas.  IMPRESSION: Negative abdominal ultrasound.   Original  Report Authenticated By: Britta Mccreedy, M.D.    Ct Abdomen Pelvis W Contrast  02/02/2012  *RADIOLOGY REPORT*  Clinical Data: Clinical diagnosis of acute pancreatitis.  Recent heavy alcohol use.  Lipase of 01/1990.  Abdominal pain.  CT ABDOMEN AND PELVIS WITH CONTRAST  Technique:  Multidetector CT imaging of the abdomen and pelvis was performed following the standard protocol during bolus administration of intravenous contrast.  Contrast: OMNIPAQUE IOHEXOL 300 MG/ML  SOLN  Comparison: Abdominal ultrasound 02/02/2012  Findings: There are patchy areas of atelectasis in both lower lobes.  Negative for pleural effusion.  Imaged portion of the heart appears upper normal in size.  There is stranding in the peripancreatic fat adjacent to the pancreatic head, neck, and proximal body.  Extensive mesenteric fat stranding and fluid extends anterior and inferior to the pancreas and adjacent to the proximal transverse colon.  Additionally, there is a small to moderate volume of ascites in the dependent portion of the pelvis.  This inflammatory stranding and fluid is compatible with the given diagnosis of pancreatitis.  The pancreas itself enhances normally.  No evidence of pseudocyst or pancreatic necrosis.  There is no pancreatic calcification or pancreatic ductal dilatation.  The liver, gallbladder, spleen, adrenal glands, and kidneys are within normal limits.  The stomach is well distended with oral contrast and appears normal.  There is moderate colonic diverticulosis. Probable vaginal contraceptive ring noted.  Urinary bladder within normal limits. Uterus and ovaries within normal limits for premenopausal patient.  Abdominal aorta normal in caliber.  Reactive size lymph nodes in the right lower quadrant ileocolic mesentery.  No pathologic lymphadenopathy in the abdomen or pelvis.  No acute or suspicious bony abnormality is identified.  IMPRESSION: Inflammatory stranding and fluid adjacent to the proximal pancreas and  extending inferiorly in the right abdomen, with small to moderate ascites in the dependent portion the pelvis.  Findings are compatible with the clinical and laboratory diagnosis of pancreatitis. 2.  No evidence of pancreatic necrosis, pseudocyst, or abscess. 3. Colonic diverticulosis. 4.  Probable vaginal contraceptive ring.   Original Report Authenticated By: Britta Mccreedy, M.D.    Dg Abd Acute W/chest  02/07/2012  *RADIOLOGY REPORT*  Clinical Data: Persistent abdominal pain.  History of pancreatitis.  ACUTE ABDOMEN SERIES (ABDOMEN 2 VIEW & CHEST 1 VIEW)  Comparison: CT scan 02/02/2012.  Findings: The upright chest x-ray demonstrates streaky bibasilar atelectasis and small effusions.  Two views of the abdomen demonstrate a normal bowel gas pattern. The soft tissue shadows of the abdomen are maintained.  No worrisome calcifications.  The bony structures are intact.  IMPRESSION:  1.  Bibasilar atelectasis. 2.  Unremarkable abdominal radiographs.   Original Report Authenticated By: Rudie Meyer, M.D.          Subjective:   Objective: Filed Vitals:   02/06/12 2105 02/07/12 0444 02/07/12 0846 02/07/12 1406  BP: 128/88 125/70  116/80  Pulse: 78 54  54  Temp: 99 F (37.2 C) 98.9 F (37.2 C)  98.7 F (37.1 C)  TempSrc: Oral Oral  Oral  Resp: 18 18  18   Height:      Weight:   78.2 kg (172 lb 6.4 oz)   SpO2: 94% 96%  96%    Intake/Output Summary (Last 24 hours) at 02/07/12 1636 Last data filed at 02/07/12 1531  Gross per 24 hour  Intake   3815 ml  Output   1025 ml  Net  2790 ml   Weight change:  Exam:   General:  Pt is alert, follows commands appropriately, not in acute distress  HEENT: No icterus, No thrush, No neck mass, [place]/AT  Cardiovascular: RRR, S1/S2, no rubs, no gallops  Respiratory: CTA bilaterally, no wheezing, no crackles, no rhonchi  Abdomen: Soft/+BS, non tender, non distended, no guarding  Extremities: No edema, No lymphangitis, No petechiae, No rashes, no  synovitis  Data Reviewed: Basic Metabolic Panel:  Lab 02/07/12 4782 02/06/12 0519 02/05/12 0545 02/04/12 0505 02/03/12 0445 02/02/12 1836  NA 139 139 138 136 134* --  K 3.5 3.3* 3.5 3.6 3.3* --  CL 105 105 107 104 102 --  CO2 25 24 23 22 21  --  GLUCOSE 119* 111* 94 104* 88 --  BUN 4* <3* <3* 3* 4* --  CREATININE 0.72 0.66 0.73 0.81 0.76 --  CALCIUM 8.6 8.1* 8.5 8.4 9.0 --  MG 1.9 -- -- 1.9 1.6 1.6  PHOS -- -- -- -- -- --   Liver Function Tests:  Lab 02/03/12 0445 02/02/12 0700  AST 10 16  ALT 7 11  ALKPHOS 63 67  BILITOT 2.2* 2.4*  PROT 5.8* 7.5  ALBUMIN 2.6* 3.9    Lab 02/05/12 0545 02/04/12 0505 02/03/12 0445 02/02/12 0700  LIPASE 97* 39 59 192*  AMYLASE -- -- -- --   No results found for this basename: AMMONIA:5 in the last 168 hours CBC:  Lab 02/07/12 0500 02/06/12 0519 02/04/12 0505 02/03/12 0445 02/02/12 0700  WBC 9.1 10.1 13.5* 17.3* 21.0*  NEUTROABS -- -- -- 14.3* 17.4*  HGB 10.5* 10.8* 10.9* 11.8* 14.9  HCT 29.8* 31.2* 31.5* 33.7* 42.2  MCV 84.9 85.0 86.5 85.5 85.4  PLT 275 279 225 205 277   Cardiac Enzymes: No results found for this basename: CKTOTAL:5,CKMB:5,CKMBINDEX:5,TROPONINI:5 in the last 168 hours BNP: No components found with this basename: POCBNP:5 CBG: No results found for this basename: GLUCAP:5 in the last 168 hours  Recent Results (from the past 240 hour(s))  URINE CULTURE     Status: Normal   Collection Time   02/02/12  6:00 PM      Component Value Range Status Comment   Specimen Description URINE, CATHETERIZED   Final    Special Requests NONE   Final    Culture  Setup Time 02/03/2012 11:45   Final    Colony Count 20,OOO COLONIES/ML   Final    Culture     Final    Value: GROUP B STREP(S.AGALACTIAE)ISOLATED     Note: TESTING AGAINST S. AGALACTIAE NOT ROUTINELY PERFORMED DUE TO PREDICTABILITY OF AMP/PEN/VAN SUSCEPTIBILITY.   Report Status 02/05/2012 FINAL   Final      Scheduled Meds:   . docusate sodium  100 mg Oral BID  .  enoxaparin (LOVENOX) injection  40 mg Subcutaneous Q24H  . folic acid  1 mg Oral Daily  . multivitamin with minerals  1 tablet Oral Daily  . nicotine  21 mg Transdermal Daily  . pantoprazole  40 mg Oral Daily  . sodium chloride  3 mL Intravenous Q12H  . thiamine  100 mg Oral Daily   Or  . thiamine  100 mg Intravenous Daily   Continuous Infusions:   . sodium chloride 1,000 mL (02/07/12 1135)     Sanders Manninen, DO  [facility] Pager [phone]  If 7PM-7AM, please contact night-coverage [email or web address] Password [facility] 02/07/2012, 4:36 PM   LOS: 5 days

## 2012-02-08 LAB — URINALYSIS, MICROSCOPIC ONLY
Bilirubin Urine: NEGATIVE
Glucose, UA: NEGATIVE mg/dL
Hgb urine dipstick: NEGATIVE
Ketones, ur: NEGATIVE mg/dL
Leukocytes, UA: NEGATIVE
Protein, ur: NEGATIVE mg/dL

## 2012-02-08 LAB — CBC
Hemoglobin: 10.7 g/dL — ABNORMAL LOW (ref 12.0–15.0)
MCH: 29 pg (ref 26.0–34.0)
MCHC: 34.2 g/dL (ref 30.0–36.0)
MCV: 84.8 fL (ref 78.0–100.0)

## 2012-02-08 LAB — BASIC METABOLIC PANEL
BUN: 3 mg/dL — ABNORMAL LOW (ref 6–23)
CO2: 24 mEq/L (ref 19–32)
GFR calc non Af Amer: >90 mL/min (ref >90)
Glucose, Bld: 108 mg/dL — ABNORMAL HIGH (ref 70–99)
Potassium: 3.3 mEq/L — ABNORMAL LOW (ref 3.5–5.1)

## 2012-02-08 MED ORDER — OXYCODONE HCL 10 MG PO TABS: 10.0000 mg | ORAL_TABLET | Freq: Four times a day (QID) | ORAL | Status: DC | PRN

## 2012-02-08 MED ORDER — POTASSIUM CHLORIDE 20 MEQ/15ML (10%) PO LIQD
20.0000 meq | Freq: Once | ORAL | Status: DC
  Filled 2012-02-08: qty 15

## 2012-02-08 NOTE — Progress Notes (Signed)
Pt's HR dropped to 39, then sustained at 40. Called MD on call, Burnadette Peter, and she had no concerns or new orders. Will continue to monitor. - Christell Faith, RN

## 2012-02-08 NOTE — Discharge Summary (Addendum)
Physician Discharge Summary  [name] WUJ:[id number] DOB: 10/06/1980 DOA: 02/02/2012  PCP: No primary provider on file.  Admit date: 02/02/2012 Discharge date: 02/08/2012  Recommendations for Outpatient Follow-up:  1. Pt will need to follow up with PCP in 2 weeks post discharge 2. Please obtain BMP to evaluate electrolytes and kidney function 3. Please also check CBC to evaluate Hg and Hct levels 4.   Discharge Diagnoses:  Principal Problem:  *Pancreatitis, acute Active Problems:  Generalized anxiety disorder  Substance abuse  Hypotension  Bipolar disorder  Tobacco abuse  ETOH abuse  Tachycardia  Dehydration  Leukocytosis  Scoliosis  Hypokalemia #1 acute pancreatitis  Likely alcohol induced. Patient does have a history of alcohol use. Alcohol level is less than 11. Abdominal ultrasound is negative for gallstones. Patient with normal LFTs. CT of the abdomen and pelvis consistent with acute pancreatitis.  -Abdominal pain is improving  -Tolerated the liquids (02/05/12)  Patient was started on clear liquids today however nursing states patient still with abdominal pain 02/04/12--> patient n.p.o. again. Continue IV fluids. Supportive care.  -02/05/12 restart clear liquid diet  -had some abdominal pain after meals 02/07/12--wanted to stay another day  -change opioids to po--02/07/12--patient was subsequently controlled with oxycodone 10 mg every 6 hours when necessary -On the day of discharge, the patient tolerated breakfast and lunch without any difficulties and minimal abdominal pain. #2 borderline hypotension  -improved with IV fluid -Likely secondary to volume depletion. Patient does have a leukocytosis however urinalysis is negative for UTI. Chest x-ray is negative. Improving with IV fluids. Follow.  #3 dehydration  IV fluids-->improved  #4 generalized anxiety disorder  Ativan as needed.  #5 bipolar disorder  Patient stated she stopped taking her medications about 2-3 months ago  secondary to increased lethargy and drowsiness. Patient states has a child at home and as such cannot be drowsy. Patient has been seen by psychiatry and patient is reluctant to try any other SSRIs or other antipsychotic medications other than benzos. Will check a UDS. Psychiatry recommends drug rehabilitation and outpatient psych followup after discharge. Will have psych social worker aid in this process.  #6 tobacco abuse  Tobacco cessation. Continue nicotine patch.  #7 history of alcohol use  repleted magnesium. Continue Ativan CIWA protocol. Continue thiamine and folic acid.  #8 leukocytosis  -improved--WBC 8.5 on the day of discharge Likely a reactive leukocytosis secondary to problem #1. Urinalysis is not consistent with a UTI. Chest x-ray negative. Leukocytosis is slowly trending down. No antibiotics were given. #9 tachycardia  -improved  Likely secondary to dehydration volume depletion to problem #1. EKG with normal sinus rhythm. Continue IV fluids and supportive care. Follow.  #10 prophylaxis  PPI for GI prophylaxis. Lovenox for DVT prophylaxis.  Hypokalemia  -Repleted   Discharge Condition: stable  Disposition:  Follow-up Information    Please follow up. (Walk in clinic.)    Contact information:   [facility] [address] [place] [phone] Walk in clinic-M-TH 8a-8:30p;Fri,Sat,Sun 8a-6p         Diet: regular Wt Readings from Last 3 Encounters:  02/08/12 77.8 kg (171 lb 8.3 oz)  09/05/11 80.74 kg (178 lb)  11/21/10 81.647 kg (180 lb)    History of present illness:  32 y.o. female with history of anxiety, bipolar disorder, fibromyalgia, alcohol abuse, tobacco abuse who presents to the ED with a 2 day history of sharp stabbing abdominal/mid sternal chest pain with associated nausea and emesis.. Patient with some associated shortness  of breath as well. Patient denies any fever, no chills, no cough, no diarrhea, no constipation, no dysuria, no generalized  weakness. Patient denies any prior history of pancreatitis. Patient does endorse alcohol use, states she drinks about 240 ounces of beer daily. Patient denies any hematemesis. Patient denies any melena. Patient denies any hematochezia. Patient was seen in the ED was noted to have a lipase level of 192. Patient was also noted to have borderline blood pressure in the sinus tachycardia. Urinalysis which was done nitrite negative with small leukocytes. Comprehensive metabolic profile done at a sodium of 131 a glucose of 123 a total bilirubin of 2.4 otherwise was within normal limits. CBC done had a white count of 21,000 otherwise was within normal limits. Will call to admit the patient for further evaluation and management. In the ED patient was given about 5 mg of IV Dilantin total in addition to some Ativan and Zofran.     Discharge Exam: Filed Vitals:   02/08/12 1000  BP: 109/59  Pulse: 72  Temp: 98.4 F (36.9 C)  Resp: 16   Filed Vitals:   02/07/12 1406 02/07/12 2132 02/08/12 0500 02/08/12 1000  BP: 116/80 137/77 116/83 109/59  Pulse: 54 49 69 72  Temp: 98.7 F (37.1 C) 98.2 F (36.8 C) 97.9 F (36.6 C) 98.4 F (36.9 C)  TempSrc: Oral Oral Oral Oral  Resp: 18 18 18 16   Height:      Weight:   77.8 kg (171 lb 8.3 oz)   SpO2: 96% 100% 96% 97%   General: A&O x 3, NAD, pleasant, cooperative Cardiovascular: RRR, no rub, no gallop, no S3 Respiratory: CTAB, no wheeze, no rhonchi Abdomen:soft, nontender, nondistended, positive bowel sounds Extremities: No edema, No lymphangitis, no petechiae  Discharge Instructions      Discharge Orders    Future Orders Please Complete By Expires   Diet - low sodium heart healthy      Increase activity slowly          Medication List     As of 02/08/2012  2:20 PM    TAKE these medications         Oxycodone HCl 10 MG Tabs   Take 1 tablet (10 mg total) by mouth every 6 (six) hours as needed for pain.           The results of  significant diagnostics from this hospitalization (including imaging, microbiology, ancillary and laboratory) are listed below for reference.    Significant Diagnostic Studies: Dg Chest 2 View  02/02/2012  *RADIOLOGY REPORT*  Clinical Data: Leukocytosis.  Sternal pain.  CHEST - 2 VIEW  Comparison: None  Findings: The cardiac silhouette, mediastinal and hilar contours are within normal limits.  There is streaky bibasilar subsegmental atelectasis without definite infiltrate or effusion.  The bony thorax is intact.  IMPRESSION: Bibasilar subsegmental atelectasis.   Original Report Authenticated By: Rudie Meyer, M.D.    US Abdomen Complete  02/02/2012  *RADIOLOGY REPORT*  Clinical Data:  Abdominal pain  ABDOMINAL ULTRASOUND COMPLETE  Comparison:  None.  Findings:  Gallbladder:  No gallstones, gallbladder wall thickening, or pericholecystic fluid. Sonographic Murphy's sign is negative.  Common Bile Duct:  Within normal limits in caliber. Measures 3.7 mm.  Liver: No focal mass lesion identified.  Within normal limits in parenchymal echogenicity.  IVC:  Appears normal.  Pancreas:  Although the pancreas is difficult to visualize in its entirety, no focal pancreatic abnormality is identified. Marland Kitchen  Spleen:  Within normal limits  in size and echotexture.  Right kidney:  Normal in size and parenchymal echogenicity.  No evidence of mass or hydronephrosis.  Left kidney:  Normal in size and parenchymal echogenicity.  No evidence of mass or hydronephrosis.  Abdominal Aorta:  No aneurysm identified. Distal abdominal aorta difficult to visualize due to overlying bowel gas.  IMPRESSION: Negative abdominal ultrasound.   Original Report Authenticated By: Britta Mccreedy, M.D.    Ct Abdomen Pelvis W Contrast  02/02/2012  *RADIOLOGY REPORT*  Clinical Data: Clinical diagnosis of acute pancreatitis.  Recent heavy alcohol use.  Lipase of 01/1990.  Abdominal pain.  CT ABDOMEN AND PELVIS WITH CONTRAST  Technique:  Multidetector CT imaging  of the abdomen and pelvis was performed following the standard protocol during bolus administration of intravenous contrast.  Contrast: OMNIPAQUE IOHEXOL 300 MG/ML  SOLN  Comparison: Abdominal ultrasound 02/02/2012  Findings: There are patchy areas of atelectasis in both lower lobes.  Negative for pleural effusion.  Imaged portion of the heart appears upper normal in size.  There is stranding in the peripancreatic fat adjacent to the pancreatic head, neck, and proximal body.  Extensive mesenteric fat stranding and fluid extends anterior and inferior to the pancreas and adjacent to the proximal transverse colon.  Additionally, there is a small to moderate volume of ascites in the dependent portion of the pelvis.  This inflammatory stranding and fluid is compatible with the given diagnosis of pancreatitis.  The pancreas itself enhances normally.  No evidence of pseudocyst or pancreatic necrosis.  There is no pancreatic calcification or pancreatic ductal dilatation.  The liver, gallbladder, spleen, adrenal glands, and kidneys are within normal limits.  The stomach is well distended with oral contrast and appears normal.  There is moderate colonic diverticulosis. Probable vaginal contraceptive ring noted.  Urinary bladder within normal limits. Uterus and ovaries within normal limits for premenopausal patient.  Abdominal aorta normal in caliber.  Reactive size lymph nodes in the right lower quadrant ileocolic mesentery.  No pathologic lymphadenopathy in the abdomen or pelvis.  No acute or suspicious bony abnormality is identified.  IMPRESSION: Inflammatory stranding and fluid adjacent to the proximal pancreas and extending inferiorly in the right abdomen, with small to moderate ascites in the dependent portion the pelvis.  Findings are compatible with the clinical and laboratory diagnosis of pancreatitis. 2.  No evidence of pancreatic necrosis, pseudocyst, or abscess. 3. Colonic diverticulosis. 4.  Probable vaginal  contraceptive ring.   Original Report Authenticated By: Britta Mccreedy, M.D.    Dg Abd Acute W/chest  02/07/2012  *RADIOLOGY REPORT*  Clinical Data: Persistent abdominal pain.  History of pancreatitis.  ACUTE ABDOMEN SERIES (ABDOMEN 2 VIEW & CHEST 1 VIEW)  Comparison: CT scan 02/02/2012.  Findings: The upright chest x-ray demonstrates streaky bibasilar atelectasis and small effusions.  Two views of the abdomen demonstrate a normal bowel gas pattern. The soft tissue shadows of the abdomen are maintained.  No worrisome calcifications.  The bony structures are intact.  IMPRESSION:  1.  Bibasilar atelectasis. 2.  Unremarkable abdominal radiographs.   Original Report Authenticated By: Rudie Meyer, M.D.      Microbiology: Recent Results (from the past 240 hour(s))  URINE CULTURE     Status: Normal   Collection Time   02/02/12  6:00 PM      Component Value Range Status Comment   Specimen Description URINE, CATHETERIZED   Final    Special Requests NONE   Final    Culture  Setup Time 02/03/2012 11:45  Final    Colony Count 20,OOO COLONIES/ML   Final    Culture     Final    Value: GROUP B STREP(S.AGALACTIAE)ISOLATED     Note: TESTING AGAINST S. AGALACTIAE NOT ROUTINELY PERFORMED DUE TO PREDICTABILITY OF AMP/PEN/VAN SUSCEPTIBILITY.   Report Status 02/05/2012 FINAL   Final      Labs: Basic Metabolic Panel:  Lab 02/08/12 1610 02/07/12 0500 02/06/12 0519 02/05/12 0545 02/04/12 0505 02/03/12 0445 02/02/12 1836  NA 139 139 139 138 136 -- --  K 3.3* 3.5 -- -- -- -- --  CL 106 105 105 107 104 -- --  CO2 24 25 24 23 22  -- --  GLUCOSE 108* 119* 111* 94 104* -- --  BUN 3* 4* <3* <3* 3* -- --  CREATININE 0.72 0.72 0.66 0.73 0.81 -- --  CALCIUM 8.7 8.6 8.1* 8.5 8.4 -- --  MG -- 1.9 -- -- 1.9 1.6 1.6  PHOS -- -- -- -- -- -- --   Liver Function Tests:  Lab 02/03/12 0445 02/02/12 0700  AST 10 16  ALT 7 11  ALKPHOS 63 67  BILITOT 2.2* 2.4*  PROT 5.8* 7.5  ALBUMIN 2.6* 3.9    Lab 02/05/12 0545  02/04/12 0505 02/03/12 0445 02/02/12 0700  LIPASE 97* 39 59 192*  AMYLASE -- -- -- --   No results found for this basename: AMMONIA:5 in the last 168 hours CBC:  Lab 02/08/12 0538 02/07/12 0500 02/06/12 0519 02/04/12 0505 02/03/12 0445 02/02/12 0700  WBC 8.5 9.1 10.1 13.5* 17.3* --  NEUTROABS -- -- -- -- 14.3* 17.4*  HGB 10.7* 10.5* 10.8* 10.9* 11.8* --  HCT 31.3* 29.8* 31.2* 31.5* 33.7* --  MCV 84.8 84.9 85.0 86.5 85.5 --  PLT 334 275 279 225 205 --   Cardiac Enzymes: No results found for this basename: CKTOTAL:5,CKMB:5,CKMBINDEX:5,TROPONINI:5 in the last 168 hours BNP: No components found with this basename: POCBNP:5 CBG: No results found for this basename: GLUCAP:5 in the last 168 hours  Time coordinating discharge:  Greater than 30 minutes  Signed:  Adelynne Joerger, DO [facility] Pager: [phone] 02/08/2012, 2:20 PM

## 2012-02-09 LAB — URINE CULTURE
Colony Count: NO GROWTH
Culture: NO GROWTH

## 2012-05-04 ENCOUNTER — Emergency Department (HOSPITAL_COMMUNITY)
Admission: EM | Admit: 2012-05-04 | Discharge: 2012-05-04 | Disposition: A | Discharge status: Home / Self Care | Payer: Medicare Other | Attending: Emergency Medicine | Admitting: Emergency Medicine

## 2012-05-04 ENCOUNTER — Encounter: Payer: Self-pay | Admitting: Emergency Medicine

## 2012-05-04 ENCOUNTER — Encounter (HOSPITAL_COMMUNITY): Payer: Self-pay | Admitting: Emergency Medicine

## 2012-05-04 DIAGNOSIS — Z8619 Personal history of other infectious and parasitic diseases: Secondary | ICD-10-CM | POA: Insufficient documentation

## 2012-05-04 DIAGNOSIS — R112 Nausea with vomiting, unspecified: Secondary | ICD-10-CM | POA: Insufficient documentation

## 2012-05-04 DIAGNOSIS — Z87440 Personal history of urinary (tract) infections: Secondary | ICD-10-CM | POA: Insufficient documentation

## 2012-05-04 DIAGNOSIS — F1010 Alcohol abuse, uncomplicated: Secondary | ICD-10-CM | POA: Insufficient documentation

## 2012-05-04 DIAGNOSIS — Z3202 Encounter for pregnancy test, result negative: Secondary | ICD-10-CM | POA: Insufficient documentation

## 2012-05-04 DIAGNOSIS — Z8739 Personal history of other diseases of the musculoskeletal system and connective tissue: Secondary | ICD-10-CM | POA: Insufficient documentation

## 2012-05-04 DIAGNOSIS — Z8659 Personal history of other mental and behavioral disorders: Secondary | ICD-10-CM | POA: Insufficient documentation

## 2012-05-04 DIAGNOSIS — F17200 Nicotine dependence, unspecified, uncomplicated: Secondary | ICD-10-CM | POA: Insufficient documentation

## 2012-05-04 DIAGNOSIS — R1013 Epigastric pain: Secondary | ICD-10-CM | POA: Insufficient documentation

## 2012-05-04 DIAGNOSIS — F1210 Cannabis abuse, uncomplicated: Secondary | ICD-10-CM | POA: Insufficient documentation

## 2012-05-04 LAB — CBC WITH DIFFERENTIAL/PLATELET
Basophils Absolute: 0 10*3/uL (ref 0.0–0.1)
Basophils Relative: 0 % (ref 0–1)
Eosinophils Relative: 0 % (ref 0–5)
Lymphocytes Relative: 23 % (ref 12–46)
MCHC: 34.5 g/dL (ref 30.0–36.0)
MCV: 83.2 fL (ref 78.0–100.0)
Neutro Abs: 9.5 10*3/uL — ABNORMAL HIGH (ref 1.7–7.7)
Platelets: 360 10*3/uL (ref 150–400)
RDW: 14.1 % (ref 11.5–15.5)
WBC: 13.4 10*3/uL — ABNORMAL HIGH (ref 4.0–10.5)

## 2012-05-04 LAB — COMPREHENSIVE METABOLIC PANEL
ALT: 9 U/L (ref 0–35)
AST: 13 U/L (ref 0–37)
Albumin: 3.8 g/dL (ref 3.5–5.2)
CO2: 22 mEq/L (ref 19–32)
Calcium: 9.6 mg/dL (ref 8.4–10.5)
Creatinine, Ser: 0.67 mg/dL (ref 0.50–1.10)
GFR calc non Af Amer: >90 mL/min (ref >90)
Sodium: 139 mEq/L (ref 135–145)

## 2012-05-04 LAB — URINALYSIS, ROUTINE W REFLEX MICROSCOPIC
Nitrite: NEGATIVE
Protein, ur: NEGATIVE mg/dL
Specific Gravity, Urine: 1.026 (ref 1.005–1.030)
Urobilinogen, UA: 0.2 mg/dL (ref 0.0–1.0)

## 2012-05-04 LAB — POCT PREGNANCY, URINE: Preg Test, Ur: NEGATIVE

## 2012-05-04 MED ORDER — PANTOPRAZOLE SODIUM 20 MG PO TBEC: 20.0000 mg | DELAYED_RELEASE_TABLET | Freq: Every day | ORAL | Status: DC

## 2012-05-04 MED ORDER — SODIUM CHLORIDE 0.9 % IV BOLUS (SEPSIS): 1000.0000 mL | Freq: Once | INTRAVENOUS | Status: DC

## 2012-05-04 MED ORDER — MORPHINE SULFATE 4 MG/ML IJ SOLN
4.0000 mg | Freq: Once | INTRAMUSCULAR | Status: AC
  Administered 2012-05-04: 4 mg via INTRAVENOUS
  Filled 2012-05-04: qty 1

## 2012-05-04 MED ORDER — HYDROMORPHONE HCL PF 1 MG/ML IJ SOLN
1.0000 mg | Freq: Once | INTRAMUSCULAR | Status: AC
  Administered 2012-05-04: 1 mg via INTRAVENOUS
  Filled 2012-05-04: qty 1

## 2012-05-04 MED ORDER — TRAMADOL HCL 50 MG PO TABS: 50.0000 mg | ORAL_TABLET | Freq: Four times a day (QID) | ORAL | Status: DC | PRN

## 2012-05-04 MED ORDER — PANTOPRAZOLE SODIUM 40 MG IV SOLR
40.0000 mg | INTRAVENOUS | Status: AC
  Administered 2012-05-04: 40 mg via INTRAVENOUS
  Filled 2012-05-04: qty 40

## 2012-05-04 MED ORDER — SODIUM CHLORIDE 0.9 % IV SOLN: Freq: Once | INTRAVENOUS | Status: DC

## 2012-05-04 MED ORDER — SODIUM CHLORIDE 0.9 % IV SOLN: 1000.0000 mL | Freq: Once | INTRAVENOUS | Status: DC

## 2012-05-04 MED ORDER — ONDANSETRON HCL 4 MG/2ML IJ SOLN
4.0000 mg | Freq: Once | INTRAMUSCULAR | Status: AC
  Administered 2012-05-04: 4 mg via INTRAVENOUS
  Filled 2012-05-04: qty 2

## 2012-05-04 MED ORDER — GI COCKTAIL ~~LOC~~
30.0000 mL | Freq: Once | ORAL | Status: AC
  Administered 2012-05-04: 30 mL via ORAL
  Filled 2012-05-04: qty 30

## 2012-05-04 MED ORDER — ONDANSETRON 4 MG PO TBDP: 4.0000 mg | ORAL_TABLET | Freq: Three times a day (TID) | ORAL | Status: DC | PRN
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
## 2012-05-04 NOTE — ED Notes (Signed)
PA notified of patient' s pain level.

## 2012-05-04 NOTE — ED Notes (Signed)
Pt reports lower abdominal pain that started 2 days ago with nausea and vomiting. Pt has had acute pancreatitis and believes the alcohol she drank 2 day ago cause this to flare up.

## 2012-05-09 NOTE — ED Provider Notes (Signed)
Medical screening examination/treatment/procedure(s) were performed by non-physician practitioner and as supervising physician I was immediately available for consultation/collaboration.    Celene Kras, MD 05/09/12 Marlyne Beards

## 2012-06-11 ENCOUNTER — Encounter (HOSPITAL_COMMUNITY): Payer: Self-pay | Admitting: Emergency Medicine

## 2012-06-11 ENCOUNTER — Emergency Department (HOSPITAL_COMMUNITY)
Admission: EM | Admit: 2012-06-11 | Discharge: 2012-06-11 | Disposition: A | Discharge status: Home / Self Care | Payer: Medicare Other | Attending: Emergency Medicine | Admitting: Emergency Medicine

## 2012-06-11 DIAGNOSIS — H538 Other visual disturbances: Secondary | ICD-10-CM | POA: Insufficient documentation

## 2012-06-11 DIAGNOSIS — H53141 Visual discomfort, right eye: Secondary | ICD-10-CM

## 2012-06-11 DIAGNOSIS — F1210 Cannabis abuse, uncomplicated: Secondary | ICD-10-CM | POA: Insufficient documentation

## 2012-06-11 DIAGNOSIS — IMO0001 Reserved for inherently not codable concepts without codable children: Secondary | ICD-10-CM | POA: Insufficient documentation

## 2012-06-11 DIAGNOSIS — Z87440 Personal history of urinary (tract) infections: Secondary | ICD-10-CM | POA: Insufficient documentation

## 2012-06-11 DIAGNOSIS — H209 Unspecified iridocyclitis: Secondary | ICD-10-CM

## 2012-06-11 DIAGNOSIS — M4120 Other idiopathic scoliosis, site unspecified: Secondary | ICD-10-CM | POA: Insufficient documentation

## 2012-06-11 DIAGNOSIS — F319 Bipolar disorder, unspecified: Secondary | ICD-10-CM | POA: Insufficient documentation

## 2012-06-11 DIAGNOSIS — F209 Schizophrenia, unspecified: Secondary | ICD-10-CM | POA: Insufficient documentation

## 2012-06-11 DIAGNOSIS — H53149 Visual discomfort, unspecified: Secondary | ICD-10-CM | POA: Insufficient documentation

## 2012-06-11 DIAGNOSIS — F411 Generalized anxiety disorder: Secondary | ICD-10-CM | POA: Insufficient documentation

## 2012-06-11 DIAGNOSIS — Y9383 Activity, rough housing and horseplay: Secondary | ICD-10-CM | POA: Insufficient documentation

## 2012-06-11 DIAGNOSIS — Z8619 Personal history of other infectious and parasitic diseases: Secondary | ICD-10-CM | POA: Insufficient documentation

## 2012-06-11 DIAGNOSIS — F17200 Nicotine dependence, unspecified, uncomplicated: Secondary | ICD-10-CM | POA: Insufficient documentation

## 2012-06-11 DIAGNOSIS — H5789 Other specified disorders of eye and adnexa: Secondary | ICD-10-CM | POA: Insufficient documentation

## 2012-06-11 DIAGNOSIS — IMO0002 Reserved for concepts with insufficient information to code with codable children: Secondary | ICD-10-CM | POA: Insufficient documentation

## 2012-06-11 DIAGNOSIS — S0510XA Contusion of eyeball and orbital tissues, unspecified eye, initial encounter: Secondary | ICD-10-CM | POA: Insufficient documentation

## 2012-06-11 DIAGNOSIS — F1010 Alcohol abuse, uncomplicated: Secondary | ICD-10-CM | POA: Insufficient documentation

## 2012-06-11 DIAGNOSIS — Y929 Unspecified place or not applicable: Secondary | ICD-10-CM | POA: Insufficient documentation

## 2012-06-11 MED ORDER — KETOROLAC TROMETHAMINE 0.5 % OP SOLN
1.0000 [drp] | Freq: Four times a day (QID) | OPHTHALMIC | Status: DC
  Administered 2012-06-11: 1 [drp] via OPHTHALMIC
  Filled 2012-06-11: qty 3

## 2012-06-11 MED ORDER — CYCLOPENTOLATE HCL 1 % OP SOLN
1.0000 [drp] | Freq: Once | OPHTHALMIC | Status: AC
  Administered 2012-06-11: 1 [drp] via OPHTHALMIC
  Filled 2012-06-11: qty 2

## 2012-06-11 MED ORDER — TETRACAINE HCL 0.5 % OP SOLN
1.0000 [drp] | Freq: Once | OPHTHALMIC | Status: AC
  Administered 2012-06-11: 1 [drp] via OPHTHALMIC
  Filled 2012-06-11: qty 2

## 2012-06-11 MED ORDER — POLYMYXIN B-TRIMETHOPRIM 10000-0.1 UNIT/ML-% OP SOLN
1.0000 [drp] | OPHTHALMIC | Status: DC
  Administered 2012-06-11: 1 [drp] via OPHTHALMIC
  Filled 2012-06-11: qty 10

## 2012-06-11 NOTE — ED Provider Notes (Signed)
Medical screening examination/treatment/procedure(s) were performed by non-physician practitioner and as supervising physician I was immediately available for consultation/collaboration.    Keanthony Poole R Maziah Smola, MD 06/11/12 1612 
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
## 2012-06-11 NOTE — ED Notes (Signed)
Pain, swelling  and redness  in r/eye x 2 days

## 2012-06-19 ENCOUNTER — Encounter (HOSPITAL_COMMUNITY): Payer: Self-pay

## 2012-06-19 ENCOUNTER — Emergency Department (HOSPITAL_COMMUNITY)
Admission: EM | Admit: 2012-06-19 | Discharge: 2012-06-19 | Disposition: A | Discharge status: Home / Self Care | Payer: Medicare Other | Attending: Emergency Medicine | Admitting: Emergency Medicine

## 2012-06-19 ENCOUNTER — Telehealth (HOSPITAL_COMMUNITY): Payer: Self-pay | Admitting: Licensed Clinical Social Worker

## 2012-06-19 DIAGNOSIS — Z8739 Personal history of other diseases of the musculoskeletal system and connective tissue: Secondary | ICD-10-CM | POA: Insufficient documentation

## 2012-06-19 DIAGNOSIS — Z8639 Personal history of other endocrine, nutritional and metabolic disease: Secondary | ICD-10-CM | POA: Insufficient documentation

## 2012-06-19 DIAGNOSIS — Z862 Personal history of diseases of the blood and blood-forming organs and certain disorders involving the immune mechanism: Secondary | ICD-10-CM | POA: Insufficient documentation

## 2012-06-19 DIAGNOSIS — Z8659 Personal history of other mental and behavioral disorders: Secondary | ICD-10-CM | POA: Insufficient documentation

## 2012-06-19 DIAGNOSIS — F1310 Sedative, hypnotic or anxiolytic abuse, uncomplicated: Secondary | ICD-10-CM | POA: Insufficient documentation

## 2012-06-19 DIAGNOSIS — K219 Gastro-esophageal reflux disease without esophagitis: Secondary | ICD-10-CM | POA: Insufficient documentation

## 2012-06-19 DIAGNOSIS — F1210 Cannabis abuse, uncomplicated: Secondary | ICD-10-CM | POA: Insufficient documentation

## 2012-06-19 DIAGNOSIS — F1910 Other psychoactive substance abuse, uncomplicated: Secondary | ICD-10-CM

## 2012-06-19 DIAGNOSIS — F1010 Alcohol abuse, uncomplicated: Secondary | ICD-10-CM | POA: Insufficient documentation

## 2012-06-19 DIAGNOSIS — IMO0002 Reserved for concepts with insufficient information to code with codable children: Secondary | ICD-10-CM | POA: Insufficient documentation

## 2012-06-19 DIAGNOSIS — Z3202 Encounter for pregnancy test, result negative: Secondary | ICD-10-CM | POA: Insufficient documentation

## 2012-06-19 DIAGNOSIS — Z87440 Personal history of urinary (tract) infections: Secondary | ICD-10-CM | POA: Insufficient documentation

## 2012-06-19 DIAGNOSIS — F17200 Nicotine dependence, unspecified, uncomplicated: Secondary | ICD-10-CM | POA: Insufficient documentation

## 2012-06-19 DIAGNOSIS — R4789 Other speech disturbances: Secondary | ICD-10-CM | POA: Insufficient documentation

## 2012-06-19 LAB — RAPID URINE DRUG SCREEN, HOSP PERFORMED
Amphetamines: NOT DETECTED
Benzodiazepines: POSITIVE — AB
Opiates: NOT DETECTED

## 2012-06-19 LAB — COMPREHENSIVE METABOLIC PANEL
ALT: 12 U/L (ref 0–35)
BUN: 10 mg/dL (ref 6–23)
CO2: 21 mEq/L (ref 19–32)
Calcium: 9.1 mg/dL (ref 8.4–10.5)
GFR calc Af Amer: >90 mL/min (ref >90)
GFR calc non Af Amer: 82 mL/min — ABNORMAL LOW (ref >90)
Glucose, Bld: 103 mg/dL — ABNORMAL HIGH (ref 70–99)
Sodium: 137 mEq/L (ref 135–145)
Total Protein: 7 g/dL (ref 6.0–8.3)

## 2012-06-19 LAB — CBC WITH DIFFERENTIAL/PLATELET
Eosinophils Absolute: 0.1 10*3/uL (ref 0.0–0.7)
Eosinophils Relative: 2 % (ref 0–5)
HCT: 39.7 % (ref 36.0–46.0)
Hemoglobin: 13.9 g/dL (ref 12.0–15.0)
Lymphocytes Relative: 27 % (ref 12–46)
Lymphs Abs: 2.2 10*3/uL (ref 0.7–4.0)
MCH: 29.8 pg (ref 26.0–34.0)
MCV: 85.2 fL (ref 78.0–100.0)
Monocytes Absolute: 0.6 10*3/uL (ref 0.1–1.0)
Monocytes Relative: 7 % (ref 3–12)
RBC: 4.66 MIL/uL (ref 3.87–5.11)
WBC: 8.2 10*3/uL (ref 4.0–10.5)

## 2012-06-19 LAB — URINALYSIS, ROUTINE W REFLEX MICROSCOPIC
Bilirubin Urine: NEGATIVE
Glucose, UA: NEGATIVE mg/dL
Ketones, ur: NEGATIVE mg/dL
Leukocytes, UA: NEGATIVE
Nitrite: NEGATIVE
Specific Gravity, Urine: 1.021 (ref 1.005–1.030)
pH: 5 (ref 5.0–8.0)

## 2012-06-19 LAB — ETHANOL: Alcohol, Ethyl (B): <11 mg/dL (ref 0–11)
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
## 2012-06-19 NOTE — ED Notes (Signed)
ACT team at bedside.  

## 2012-06-19 NOTE — ED Notes (Addendum)
Patient requesting  Detox from alcohol and barbituates. Patient states she last drank alcohol yesterday and had 1/4 bottle liquor and last took Xanax yesterday. Patient denies SI/HI

## 2012-06-19 NOTE — ED Notes (Signed)
Patient's mother took all belongings including cell phone, clothes, and jewelry. Patient placed in blue scrubs. Security called to wand patient.

## 2012-06-19 NOTE — ED Notes (Signed)
Pt escorted to discharge window. Verbalized understanding discharge instructions. In no acute distress.  Pt waiting in lobby for ride.   

## 2012-06-22 NOTE — ED Provider Notes (Signed)
Medical screening examination/treatment/procedure(s) were conducted as a shared visit with non-physician practitioner(s) and myself.  I personally evaluated the patient during the encounter  Well appearing. No criteria for inpatient detox. Outpatient referrals made for substance abuse. Dc home.  Lyanne Co, MD 06/22/12 [phone]

## 2012-07-29 ENCOUNTER — Emergency Department (HOSPITAL_COMMUNITY)
Admission: EM | Admit: 2012-07-29 | Discharge: 2012-07-30 | Disposition: A | Discharge status: Home / Self Care | Payer: Medicare Other | Attending: Emergency Medicine | Admitting: Emergency Medicine

## 2012-07-29 ENCOUNTER — Encounter (HOSPITAL_COMMUNITY): Payer: Self-pay | Admitting: Emergency Medicine

## 2012-07-29 DIAGNOSIS — Z8739 Personal history of other diseases of the musculoskeletal system and connective tissue: Secondary | ICD-10-CM | POA: Insufficient documentation

## 2012-07-29 DIAGNOSIS — Z87440 Personal history of urinary (tract) infections: Secondary | ICD-10-CM | POA: Insufficient documentation

## 2012-07-29 DIAGNOSIS — F411 Generalized anxiety disorder: Secondary | ICD-10-CM | POA: Insufficient documentation

## 2012-07-29 DIAGNOSIS — Z791 Long term (current) use of non-steroidal anti-inflammatories (NSAID): Secondary | ICD-10-CM | POA: Insufficient documentation

## 2012-07-29 DIAGNOSIS — F1994 Other psychoactive substance use, unspecified with psychoactive substance-induced mood disorder: Secondary | ICD-10-CM | POA: Insufficient documentation

## 2012-07-29 DIAGNOSIS — IMO0001 Reserved for inherently not codable concepts without codable children: Secondary | ICD-10-CM | POA: Insufficient documentation

## 2012-07-29 DIAGNOSIS — F0391 Unspecified dementia with behavioral disturbance: Secondary | ICD-10-CM | POA: Insufficient documentation

## 2012-07-29 DIAGNOSIS — F911 Conduct disorder, childhood-onset type: Secondary | ICD-10-CM | POA: Insufficient documentation

## 2012-07-29 DIAGNOSIS — F17200 Nicotine dependence, unspecified, uncomplicated: Secondary | ICD-10-CM | POA: Insufficient documentation

## 2012-07-29 DIAGNOSIS — Z8659 Personal history of other mental and behavioral disorders: Secondary | ICD-10-CM | POA: Insufficient documentation

## 2012-07-29 DIAGNOSIS — IMO0002 Reserved for concepts with insufficient information to code with codable children: Secondary | ICD-10-CM | POA: Insufficient documentation

## 2012-07-29 DIAGNOSIS — Z79899 Other long term (current) drug therapy: Secondary | ICD-10-CM | POA: Insufficient documentation

## 2012-07-29 DIAGNOSIS — F1010 Alcohol abuse, uncomplicated: Secondary | ICD-10-CM

## 2012-07-29 DIAGNOSIS — F03918 Unspecified dementia, unspecified severity, with other behavioral disturbance: Secondary | ICD-10-CM | POA: Insufficient documentation

## 2012-07-29 HISTORY — DX: Major depressive disorder, single episode, unspecified: F32.9

## 2012-07-29 HISTORY — DX: Depression, unspecified: F32.A

## 2012-07-29 LAB — CBC
HCT: 37 % (ref 36.0–46.0)
Hemoglobin: 13.5 g/dL (ref 12.0–15.0)
MCH: 32.6 pg (ref 26.0–34.0)
MCHC: 36.5 g/dL — ABNORMAL HIGH (ref 30.0–36.0)
MCV: 89.4 fL (ref 78.0–100.0)
Platelets: 272 10*3/uL (ref 150–400)
RBC: 4.14 MIL/uL (ref 3.87–5.11)
RDW: 14.7 % (ref 11.5–15.5)
WBC: 12.3 10*3/uL — ABNORMAL HIGH (ref 4.0–10.5)

## 2012-07-29 LAB — BASIC METABOLIC PANEL
BUN: 7 mg/dL (ref 6–23)
CO2: 20 mEq/L (ref 19–32)
Calcium: 9.4 mg/dL (ref 8.4–10.5)
Chloride: 105 mEq/L (ref 96–112)
Creatinine, Ser: 0.89 mg/dL (ref 0.50–1.10)
GFR calc Af Amer: >90 mL/min (ref >90)
GFR calc non Af Amer: 84 mL/min — ABNORMAL LOW (ref >90)
Glucose, Bld: 90 mg/dL (ref 70–99)
Potassium: 3.3 mEq/L — ABNORMAL LOW (ref 3.5–5.1)
Sodium: 143 mEq/L (ref 135–145)

## 2012-07-29 LAB — ETHANOL: Alcohol, Ethyl (B): 293 mg/dL — ABNORMAL HIGH (ref 0–11)

## 2012-07-29 MED ORDER — ZIPRASIDONE MESYLATE 20 MG IM SOLR
INTRAMUSCULAR | Status: AC
  Administered 2012-07-29: 10 mg
  Filled 2012-07-29: qty 20

## 2012-07-29 MED ORDER — SODIUM CHLORIDE 0.9 % IV BOLUS (SEPSIS)
1000.0000 mL | Freq: Once | INTRAVENOUS | Status: AC
  Administered 2012-07-29: 1000 mL via INTRAVENOUS

## 2012-07-29 NOTE — ED Notes (Signed)
Pt brought in by [employer].  Pt extremely combative, IVC.  Pt hog tied in [employer] car.  Extremely intoxicated.  IVC papers state that she is a harm to herself and others.

## 2012-07-29 NOTE — ED Notes (Signed)
PA informed of pt's bp

## 2012-07-29 NOTE — ED Notes (Signed)
[id number]<AV> Expected date:<BR> Expected time:<BR> Means of arrival:<BR> Comments:<BR> COMBATIVE

## 2012-07-29 NOTE — ED Notes (Signed)
Mom called and reports she would like to talk to psychiatrist prior to daughter being seen.  Contact name and number Britta Mccreedy  [phone]

## 2012-07-30 ENCOUNTER — Encounter (HOSPITAL_COMMUNITY): Payer: Self-pay | Admitting: *Deleted

## 2012-07-30 DIAGNOSIS — F1010 Alcohol abuse, uncomplicated: Secondary | ICD-10-CM

## 2012-07-30 DIAGNOSIS — F1910 Other psychoactive substance abuse, uncomplicated: Secondary | ICD-10-CM

## 2012-07-30 LAB — RAPID URINE DRUG SCREEN, HOSP PERFORMED
Amphetamines: POSITIVE — AB
Barbiturates: NOT DETECTED
Benzodiazepines: NOT DETECTED
Cocaine: POSITIVE — AB
Opiates: NOT DETECTED
Tetrahydrocannabinol: POSITIVE — AB

## 2012-07-30 MED ORDER — ACETAMINOPHEN 325 MG PO TABS
650.0000 mg | ORAL_TABLET | Freq: Four times a day (QID) | ORAL | Status: DC | PRN
  Administered 2012-07-30: 650 mg via ORAL
  Filled 2012-07-30: qty 2

## 2012-07-30 NOTE — BHH Counselor (Signed)
Patient here for substance abuse issues and also IVC'd by her mother. Patient to be discharged home, per Dr. Lucianne Muss. IVC also rescinded by Dr. Lucianne Muss and placed in patient's chart. Patient does not want substance abuse treatment. Pt offered other sources of treatment but does not want the treatment at this time. Patient however, given a resource list to take home with her with the following programs: ADS (CD-IOP), support groups such as AA, residential programs such as [facility] and [address].
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
## 2012-07-30 NOTE — ED Provider Notes (Signed)
8:25 AM Patient sitting upright, eating breakfast on AM rounds. VSS Placement pending.  Gerhard Munch, MD 07/30/12 [phone]
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
## 2012-07-30 NOTE — ED Notes (Signed)
Patient's mother brought a small red purse to the triage and then left. Purse contained a bottle of liquid make up, a tube of make up, mascara, cell phone, a watch with no band, [place] ID card, set of keys, a total of $1.23 in change, eye liner pencils x 2,  And a I phone 4 with case. [place]-Security witnessed articles in purse.
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
## 2012-07-30 NOTE — ED Notes (Signed)
Pt has visitors come to ED. Visitors told by triage nurse and then by charge that 31 year old was not allowed to come back to TCU to visit pt. Possible confusion with flex nurse telling pt earlier 32 year old was allowed to visit. Due to safety concerns and surrounding pts charge and [facility] determined 32 year old not allowed to go back to TCU to visit. This explained to pts visitors.  Visitors gave charge 1 bag of clothing to give to pt.  Clothing and personal care items wanded by security and placed in pt locker #29

## 2012-07-31 NOTE — ED Provider Notes (Signed)
Medical screening examination/treatment/procedure(s) were performed by non-physician practitioner and as supervising physician I was immediately available for consultation/collaboration.  Pama Roskos, MD 07/31/12 1454 

## 2012-08-02 NOTE — Consult Note (Signed)
Patient seen, evaluated and recommendations be by me. Patient's mother was contacted over the phone and recommendations were given to her.

## 2012-08-26 ENCOUNTER — Emergency Department (HOSPITAL_COMMUNITY)
Admission: EM | Admit: 2012-08-26 | Discharge: 2012-08-26 | Disposition: A | Discharge status: Home / Self Care | Payer: Medicare Other | Attending: Emergency Medicine | Admitting: Emergency Medicine

## 2012-08-26 ENCOUNTER — Emergency Department (HOSPITAL_COMMUNITY): Payer: Medicare Other

## 2012-08-26 ENCOUNTER — Encounter (HOSPITAL_COMMUNITY): Payer: Self-pay | Admitting: *Deleted

## 2012-08-26 DIAGNOSIS — F1410 Cocaine abuse, uncomplicated: Secondary | ICD-10-CM | POA: Insufficient documentation

## 2012-08-26 DIAGNOSIS — R112 Nausea with vomiting, unspecified: Secondary | ICD-10-CM | POA: Insufficient documentation

## 2012-08-26 DIAGNOSIS — Z8619 Personal history of other infectious and parasitic diseases: Secondary | ICD-10-CM | POA: Insufficient documentation

## 2012-08-26 DIAGNOSIS — F410 Panic disorder [episodic paroxysmal anxiety] without agoraphobia: Secondary | ICD-10-CM | POA: Insufficient documentation

## 2012-08-26 DIAGNOSIS — F17200 Nicotine dependence, unspecified, uncomplicated: Secondary | ICD-10-CM | POA: Insufficient documentation

## 2012-08-26 DIAGNOSIS — F1210 Cannabis abuse, uncomplicated: Secondary | ICD-10-CM | POA: Insufficient documentation

## 2012-08-26 DIAGNOSIS — F1110 Opioid abuse, uncomplicated: Secondary | ICD-10-CM | POA: Insufficient documentation

## 2012-08-26 DIAGNOSIS — F1910 Other psychoactive substance abuse, uncomplicated: Secondary | ICD-10-CM

## 2012-08-26 DIAGNOSIS — Z8739 Personal history of other diseases of the musculoskeletal system and connective tissue: Secondary | ICD-10-CM | POA: Insufficient documentation

## 2012-08-26 DIAGNOSIS — Z79899 Other long term (current) drug therapy: Secondary | ICD-10-CM | POA: Insufficient documentation

## 2012-08-26 DIAGNOSIS — R55 Syncope and collapse: Secondary | ICD-10-CM | POA: Insufficient documentation

## 2012-08-26 DIAGNOSIS — Z8659 Personal history of other mental and behavioral disorders: Secondary | ICD-10-CM | POA: Insufficient documentation

## 2012-08-26 DIAGNOSIS — N390 Urinary tract infection, site not specified: Secondary | ICD-10-CM | POA: Insufficient documentation

## 2012-08-26 DIAGNOSIS — S46909A Unspecified injury of unspecified muscle, fascia and tendon at shoulder and upper arm level, unspecified arm, initial encounter: Secondary | ICD-10-CM | POA: Insufficient documentation

## 2012-08-26 DIAGNOSIS — R296 Repeated falls: Secondary | ICD-10-CM | POA: Insufficient documentation

## 2012-08-26 DIAGNOSIS — Y9289 Other specified places as the place of occurrence of the external cause: Secondary | ICD-10-CM | POA: Insufficient documentation

## 2012-08-26 DIAGNOSIS — Y939 Activity, unspecified: Secondary | ICD-10-CM | POA: Insufficient documentation

## 2012-08-26 DIAGNOSIS — Z3202 Encounter for pregnancy test, result negative: Secondary | ICD-10-CM | POA: Insufficient documentation

## 2012-08-26 DIAGNOSIS — S4980XA Other specified injuries of shoulder and upper arm, unspecified arm, initial encounter: Secondary | ICD-10-CM | POA: Insufficient documentation

## 2012-08-26 LAB — CBC WITH DIFFERENTIAL/PLATELET
Basophils Absolute: 0 10*3/uL (ref 0.0–0.1)
Basophils Relative: 0 % (ref 0–1)
HCT: 41.1 % (ref 36.0–46.0)
Lymphocytes Relative: 11 % — ABNORMAL LOW (ref 12–46)
MCHC: 34.3 g/dL (ref 30.0–36.0)
Neutro Abs: 13.7 10*3/uL — ABNORMAL HIGH (ref 1.7–7.7)
Neutrophils Relative %: 84 % — ABNORMAL HIGH (ref 43–77)
RDW: 13.2 % (ref 11.5–15.5)
WBC: 16.5 10*3/uL — ABNORMAL HIGH (ref 4.0–10.5)

## 2012-08-26 LAB — URINALYSIS, ROUTINE W REFLEX MICROSCOPIC
Glucose, UA: NEGATIVE mg/dL
Ketones, ur: NEGATIVE mg/dL
Protein, ur: NEGATIVE mg/dL

## 2012-08-26 LAB — RAPID URINE DRUG SCREEN, HOSP PERFORMED
Amphetamines: NOT DETECTED
Benzodiazepines: POSITIVE — AB
Cocaine: POSITIVE — AB
Opiates: POSITIVE — AB

## 2012-08-26 LAB — BASIC METABOLIC PANEL
CO2: 24 mEq/L (ref 19–32)
Calcium: 9.4 mg/dL (ref 8.4–10.5)
Creatinine, Ser: 0.95 mg/dL (ref 0.50–1.10)
Glucose, Bld: 113 mg/dL — ABNORMAL HIGH (ref 70–99)

## 2012-08-26 LAB — URINE MICROSCOPIC-ADD ON

## 2012-08-26 MED ORDER — PROMETHAZINE HCL 25 MG PO TABS: 25.0000 mg | ORAL_TABLET | Freq: Four times a day (QID) | ORAL | Status: DC | PRN

## 2012-08-26 MED ORDER — HYDROXYZINE HCL 25 MG PO TABS: 25.0000 mg | ORAL_TABLET | Freq: Four times a day (QID) | ORAL | Status: DC | PRN

## 2012-08-26 MED ORDER — SODIUM CHLORIDE 0.9 % IV BOLUS (SEPSIS)
1000.0000 mL | Freq: Once | INTRAVENOUS | Status: AC
  Administered 2012-08-26: 1000 mL via INTRAVENOUS

## 2012-08-26 MED ORDER — SULFAMETHOXAZOLE-TMP DS 800-160 MG PO TABS
1.0000 | ORAL_TABLET | Freq: Once | ORAL | Status: AC
  Administered 2012-08-26: 1 via ORAL
  Filled 2012-08-26: qty 1

## 2012-08-26 MED ORDER — HYDROCODONE-ACETAMINOPHEN 5-325 MG PO TABS
1.0000 | ORAL_TABLET | Freq: Once | ORAL | Status: AC
  Administered 2012-08-26: 1 via ORAL
  Filled 2012-08-26: qty 1

## 2012-08-26 MED ORDER — SULFAMETHOXAZOLE-TRIMETHOPRIM 800-160 MG PO TABS: 1.0000 | ORAL_TABLET | Freq: Two times a day (BID) | ORAL | Status: DC

## 2012-08-26 MED ORDER — LORAZEPAM 2 MG/ML IJ SOLN
1.0000 mg | Freq: Once | INTRAMUSCULAR | Status: AC
  Administered 2012-08-26: 1 mg via INTRAVENOUS
  Filled 2012-08-26: qty 1

## 2012-08-26 MED ORDER — ACETAMINOPHEN 325 MG PO TABS
325.0000 mg | ORAL_TABLET | Freq: Once | ORAL | Status: AC
  Administered 2012-08-26: 325 mg via ORAL
  Filled 2012-08-26: qty 1

## 2012-08-26 NOTE — ED Provider Notes (Signed)
Medical screening examination/treatment/procedure(s) were performed by non-physician practitioner and as supervising physician I was immediately available for consultation/collaboration.  Jones Skene, M.D.     Jones Skene, MD 08/26/12 [phone]

## 2012-08-26 NOTE — ED Provider Notes (Signed)
Medical screening examination/treatment/procedure(s) were performed by non-physician practitioner and as supervising physician I was immediately available for consultation/collaboration.  Jones Skene, M.D.     Jones Skene, MD 08/26/12 [phone]

## 2012-08-26 NOTE — ED Notes (Signed)
Pt states that she has been vomiting all day, feeling anxious and panicky; pt states that she has left shoulder pain; pt also reports that she has "passed out" while taking a shower this pm; pt also c/o feeling like she can't get a deep breathe
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
## 2012-08-26 NOTE — ED Notes (Signed)
Waiting for feedback with PA.

## 2012-08-26 NOTE — ED Notes (Signed)
Pt states that she struck her left shoulder when she passed out in the shower and c/o pain to the shoulder

## 2012-08-26 NOTE — ED Provider Notes (Signed)
This is a sign out from PA [place] at shift change:[name] is a 32 y.o. female complaining of panic attack, vomiting and syncope. Patient states that she also has a left shoulder pain and feels like she can't breathe deeply. Plan is to followup CBC, UA and EKG.  Urinalysis is consistent with infection, patient is Keflex allergic or will start her on Bactrim.   Date: 08/26/2012  Rate: 79  Rhythm: normal sinus rhythm  QRS Axis: normal  Intervals: normal  ST/T Wave abnormalities: normal  Conduction Disutrbances:none  Narrative Interpretation:   Old EKG Reviewed: unchanged  Pt is hemodynamically stable, appropriate for, and amenable to discharge at this time. All questions answered. Pt verbalized understanding and agrees with care plan. Outpatient follow-up and specific return precautions discussed.    New Prescriptions   HYDROXYZINE (ATARAX/VISTARIL) 25 MG TABLET    Take 1 tablet (25 mg total) by mouth every 6 (six) hours as needed for anxiety.   PROMETHAZINE (PHENERGAN) 25 MG TABLET    Take 1 tablet (25 mg total) by mouth every 6 (six) hours as needed for nausea.   SULFAMETHOXAZOLE-TRIMETHOPRIM (SEPTRA DS) 800-160 MG PER TABLET    Take 1 tablet by mouth every 12 (twelve) hours.    Wynetta Emery, PA-C 08/26/12 [phone]

## 2012-08-27 LAB — URINE CULTURE: Colony Count: >=100000

## 2012-08-29 ENCOUNTER — Telehealth (HOSPITAL_COMMUNITY): Payer: Self-pay | Admitting: Emergency Medicine

## 2012-08-29 NOTE — ED Notes (Signed)
Post ED Visit - Positive Culture Follow-up  Culture report reviewed by antimicrobial stewardship pharmacist: []  Wes Dulaney, Pharm.D., BCPS [x]  Celedonio Miyamoto, Pharm.D., BCPS []  Georgina Pillion, [address].D., BCPS []  Conrad, [place].D., BCPS, AAHIVP []  Estella Husk, Pharm.D., BCPS, AAHIVP  Positive urine culture Treated with Sulfa-Trimeth, organism sensitive to the same and no further patient follow-up is required at this time.  Kylie A Holland 08/29/2012, 11:09 AM

## 2012-10-15 ENCOUNTER — Encounter (HOSPITAL_COMMUNITY): Payer: Self-pay | Admitting: Emergency Medicine

## 2012-10-15 ENCOUNTER — Emergency Department (HOSPITAL_COMMUNITY)
Admission: EM | Admit: 2012-10-15 | Discharge: 2012-10-16 | Disposition: A | Discharge status: Other Acute Inpatient Hospital | Payer: Medicare Other | Source: Home / Self Care | Attending: Emergency Medicine | Admitting: Emergency Medicine

## 2012-10-15 DIAGNOSIS — Z3202 Encounter for pregnancy test, result negative: Secondary | ICD-10-CM | POA: Insufficient documentation

## 2012-10-15 DIAGNOSIS — F319 Bipolar disorder, unspecified: Secondary | ICD-10-CM | POA: Insufficient documentation

## 2012-10-15 DIAGNOSIS — Z8739 Personal history of other diseases of the musculoskeletal system and connective tissue: Secondary | ICD-10-CM | POA: Insufficient documentation

## 2012-10-15 DIAGNOSIS — Z87440 Personal history of urinary (tract) infections: Secondary | ICD-10-CM | POA: Insufficient documentation

## 2012-10-15 DIAGNOSIS — R112 Nausea with vomiting, unspecified: Secondary | ICD-10-CM | POA: Insufficient documentation

## 2012-10-15 DIAGNOSIS — F1410 Cocaine abuse, uncomplicated: Secondary | ICD-10-CM | POA: Insufficient documentation

## 2012-10-15 DIAGNOSIS — Z79899 Other long term (current) drug therapy: Secondary | ICD-10-CM | POA: Insufficient documentation

## 2012-10-15 DIAGNOSIS — F209 Schizophrenia, unspecified: Secondary | ICD-10-CM | POA: Insufficient documentation

## 2012-10-15 DIAGNOSIS — F419 Anxiety disorder, unspecified: Secondary | ICD-10-CM

## 2012-10-15 DIAGNOSIS — F1110 Opioid abuse, uncomplicated: Secondary | ICD-10-CM

## 2012-10-15 DIAGNOSIS — R109 Unspecified abdominal pain: Secondary | ICD-10-CM | POA: Insufficient documentation

## 2012-10-15 DIAGNOSIS — Z8619 Personal history of other infectious and parasitic diseases: Secondary | ICD-10-CM | POA: Insufficient documentation

## 2012-10-15 DIAGNOSIS — F411 Generalized anxiety disorder: Secondary | ICD-10-CM | POA: Insufficient documentation

## 2012-10-15 DIAGNOSIS — F17200 Nicotine dependence, unspecified, uncomplicated: Secondary | ICD-10-CM | POA: Insufficient documentation

## 2012-10-15 DIAGNOSIS — F1120 Opioid dependence, uncomplicated: Secondary | ICD-10-CM | POA: Insufficient documentation

## 2012-10-15 LAB — RAPID URINE DRUG SCREEN, HOSP PERFORMED
Amphetamines: NOT DETECTED
Barbiturates: NOT DETECTED
Opiates: NOT DETECTED
Tetrahydrocannabinol: NOT DETECTED

## 2012-10-15 LAB — CBC WITH DIFFERENTIAL/PLATELET
Basophils Absolute: 0 10*3/uL (ref 0.0–0.1)
Eosinophils Absolute: 0.1 10*3/uL (ref 0.0–0.7)
Eosinophils Relative: 1 % (ref 0–5)
Lymphocytes Relative: 29 % (ref 12–46)
MCV: 86.4 fL (ref 78.0–100.0)
Neutrophils Relative %: 66 % (ref 43–77)
Platelets: 308 10*3/uL (ref 150–400)
RDW: 12.7 % (ref 11.5–15.5)
WBC: 10.1 10*3/uL (ref 4.0–10.5)

## 2012-10-15 LAB — COMPREHENSIVE METABOLIC PANEL
ALT: 12 U/L (ref 0–35)
AST: 15 U/L (ref 0–37)
CO2: 21 mEq/L (ref 19–32)
Calcium: 9.7 mg/dL (ref 8.4–10.5)
Sodium: 136 mEq/L (ref 135–145)
Total Protein: 7.5 g/dL (ref 6.0–8.3)

## 2012-10-15 LAB — URINALYSIS, ROUTINE W REFLEX MICROSCOPIC
Hgb urine dipstick: NEGATIVE
Nitrite: NEGATIVE
Protein, ur: NEGATIVE mg/dL
Urobilinogen, UA: 0.2 mg/dL (ref 0.0–1.0)

## 2012-10-15 MED ORDER — LORAZEPAM 1 MG PO TABS: 1.0000 mg | ORAL_TABLET | Freq: Three times a day (TID) | ORAL | Status: DC | PRN

## 2012-10-15 MED ORDER — ONDANSETRON HCL 4 MG PO TABS: 4.0000 mg | ORAL_TABLET | Freq: Three times a day (TID) | ORAL | Status: DC | PRN

## 2012-10-15 MED ORDER — ACETAMINOPHEN 325 MG PO TABS: 650.0000 mg | ORAL_TABLET | ORAL | Status: DC | PRN

## 2012-10-15 MED ORDER — LORAZEPAM 1 MG PO TABS
1.0000 mg | ORAL_TABLET | Freq: Once | ORAL | Status: AC
  Administered 2012-10-15: 1 mg via ORAL
  Filled 2012-10-15: qty 1

## 2012-10-15 MED ORDER — IBUPROFEN 200 MG PO TABS: 600.0000 mg | ORAL_TABLET | Freq: Three times a day (TID) | ORAL | Status: DC | PRN

## 2012-10-15 MED ORDER — ZOLPIDEM TARTRATE 10 MG PO TABS: 10.0000 mg | ORAL_TABLET | Freq: Every evening | ORAL | Status: DC | PRN

## 2012-10-15 MED ORDER — NITROFURANTOIN MONOHYD MACRO 100 MG PO CAPS
100.0000 mg | ORAL_CAPSULE | Freq: Two times a day (BID) | ORAL | Status: DC
  Administered 2012-10-15 – 2012-10-16 (×2): 100 mg via ORAL
  Filled 2012-10-15 (×3): qty 1

## 2012-10-15 MED ORDER — NICOTINE 21 MG/24HR TD PT24
21.0000 mg | MEDICATED_PATCH | Freq: Every day | TRANSDERMAL | Status: DC
  Administered 2012-10-16: 21 mg via TRANSDERMAL
  Filled 2012-10-15: qty 1

## 2012-10-15 MED ORDER — ALUM & MAG HYDROXIDE-SIMETH 200-200-20 MG/5ML PO SUSP
30.0000 mL | ORAL | Status: DC | PRN
Start: 2012-10-15 — End: 2012-10-16

## 2012-10-15 MED ORDER — LORAZEPAM 1 MG PO TABS
1.0000 mg | ORAL_TABLET | Freq: Three times a day (TID) | ORAL | Status: DC | PRN
  Administered 2012-10-15 – 2012-10-16 (×2): 1 mg via ORAL
  Filled 2012-10-15 (×2): qty 1

## 2012-10-15 MED ORDER — DIPHENHYDRAMINE HCL 25 MG PO CAPS
50.0000 mg | ORAL_CAPSULE | Freq: Once | ORAL | Status: AC
  Administered 2012-10-15: 50 mg via ORAL
  Filled 2012-10-15: qty 2

## 2012-10-15 NOTE — ED Notes (Signed)
POCT PREG resulted Neg. 

## 2012-10-15 NOTE — ED Provider Notes (Signed)
Medical screening examination/treatment/procedure(s) were performed by non-physician practitioner and as supervising physician I was immediately available for consultation/collaboration. Devoria Albe, MD, Armando Gang   Ward Givens, MD 10/15/12 2045

## 2012-10-15 NOTE — ED Notes (Signed)
Per pt, states she has been self medicating herself with heroin-needs to get medications adjusted-doesn't want detox from heroin
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
## 2012-10-16 ENCOUNTER — Encounter (HOSPITAL_COMMUNITY): Payer: Self-pay | Admitting: *Deleted

## 2012-10-16 ENCOUNTER — Inpatient Hospital Stay (HOSPITAL_COMMUNITY)
Admission: AD | Admit: 2012-10-16 | Discharge: 2012-10-22 | DRG: 897 | Disposition: A | Discharge status: Home / Self Care | Payer: Medicare Other | Source: Intra-hospital | Attending: Psychiatry | Admitting: Psychiatry

## 2012-10-16 DIAGNOSIS — IMO0001 Reserved for inherently not codable concepts without codable children: Secondary | ICD-10-CM | POA: Diagnosis present

## 2012-10-16 DIAGNOSIS — Z79899 Other long term (current) drug therapy: Secondary | ICD-10-CM

## 2012-10-16 DIAGNOSIS — F411 Generalized anxiety disorder: Secondary | ICD-10-CM | POA: Diagnosis present

## 2012-10-16 DIAGNOSIS — F319 Bipolar disorder, unspecified: Secondary | ICD-10-CM | POA: Diagnosis present

## 2012-10-16 DIAGNOSIS — F1010 Alcohol abuse, uncomplicated: Secondary | ICD-10-CM

## 2012-10-16 DIAGNOSIS — F1020 Alcohol dependence, uncomplicated: Principal | ICD-10-CM | POA: Diagnosis present

## 2012-10-16 MED ORDER — CHLORDIAZEPOXIDE HCL 25 MG PO CAPS
25.0000 mg | ORAL_CAPSULE | Freq: Four times a day (QID) | ORAL | Status: AC | PRN
  Administered 2012-10-17 – 2012-10-18 (×3): 25 mg via ORAL
  Filled 2012-10-16 (×3): qty 1

## 2012-10-16 MED ORDER — ZOLPIDEM TARTRATE 10 MG PO TABS: 10.0000 mg | ORAL_TABLET | Freq: Every evening | ORAL | Status: DC | PRN

## 2012-10-16 MED ORDER — CHLORDIAZEPOXIDE HCL 25 MG PO CAPS
25.0000 mg | ORAL_CAPSULE | ORAL | Status: AC
  Administered 2012-10-18 (×2): 25 mg via ORAL
  Filled 2012-10-16 (×3): qty 1

## 2012-10-16 MED ORDER — SERTRALINE HCL 50 MG PO TABS
50.0000 mg | ORAL_TABLET | Freq: Every day | ORAL | Status: DC
  Administered 2012-10-16 – 2012-10-22 (×7): 50 mg via ORAL
  Filled 2012-10-16 (×8): qty 1
  Filled 2012-10-16: qty 4
  Filled 2012-10-16 (×2): qty 1

## 2012-10-16 MED ORDER — CLONIDINE HCL 0.1 MG PO TABS
0.1000 mg | ORAL_TABLET | Freq: Every day | ORAL | Status: AC
  Administered 2012-10-21 – 2012-10-22 (×2): 0.1 mg via ORAL
  Filled 2012-10-16 (×2): qty 1

## 2012-10-16 MED ORDER — LOPERAMIDE HCL 2 MG PO CAPS: 2.0000 mg | ORAL_CAPSULE | ORAL | Status: AC | PRN

## 2012-10-16 MED ORDER — CLONIDINE HCL 0.1 MG PO TABS
0.1000 mg | ORAL_TABLET | Freq: Four times a day (QID) | ORAL | Status: AC
  Administered 2012-10-16 – 2012-10-18 (×7): 0.1 mg via ORAL
  Filled 2012-10-16 (×14): qty 1

## 2012-10-16 MED ORDER — TRAZODONE HCL 50 MG PO TABS
50.0000 mg | ORAL_TABLET | Freq: Every evening | ORAL | Status: DC | PRN
  Administered 2012-10-16 – 2012-10-21 (×10): 50 mg via ORAL
  Filled 2012-10-16 (×17): qty 1

## 2012-10-16 MED ORDER — SERTRALINE HCL 50 MG PO TABS
50.0000 mg | ORAL_TABLET | Freq: Every day | ORAL | Status: DC
  Administered 2012-10-16: 50 mg via ORAL
  Filled 2012-10-16: qty 1

## 2012-10-16 MED ORDER — CHLORDIAZEPOXIDE HCL 25 MG PO CAPS
25.0000 mg | ORAL_CAPSULE | Freq: Every day | ORAL | Status: AC
  Administered 2012-10-19: 25 mg via ORAL

## 2012-10-16 MED ORDER — CLONIDINE HCL 0.1 MG PO TABS
0.1000 mg | ORAL_TABLET | ORAL | Status: AC
  Administered 2012-10-19 – 2012-10-20 (×3): 0.1 mg via ORAL
  Filled 2012-10-16 (×4): qty 1

## 2012-10-16 MED ORDER — ONDANSETRON 4 MG PO TBDP
ORAL_TABLET | ORAL | Status: AC
  Filled 2012-10-16: qty 1

## 2012-10-16 MED ORDER — CHLORDIAZEPOXIDE HCL 25 MG PO CAPS
25.0000 mg | ORAL_CAPSULE | Freq: Four times a day (QID) | ORAL | Status: AC
  Administered 2012-10-16 (×2): 25 mg via ORAL
  Filled 2012-10-16 (×2): qty 1

## 2012-10-16 MED ORDER — ALUM & MAG HYDROXIDE-SIMETH 200-200-20 MG/5ML PO SUSP: 30.0000 mL | ORAL | Status: DC | PRN

## 2012-10-16 MED ORDER — ONDANSETRON 4 MG PO TBDP
4.0000 mg | ORAL_TABLET | Freq: Four times a day (QID) | ORAL | Status: AC | PRN
  Administered 2012-10-16 – 2012-10-18 (×4): 4 mg via ORAL
  Filled 2012-10-16 (×3): qty 1

## 2012-10-16 MED ORDER — MAGNESIUM HYDROXIDE 400 MG/5ML PO SUSP
30.0000 mL | Freq: Every day | ORAL | Status: DC | PRN
  Administered 2012-10-18 – 2012-10-19 (×2): 30 mL via ORAL

## 2012-10-16 MED ORDER — HYDROXYZINE HCL 25 MG PO TABS
25.0000 mg | ORAL_TABLET | Freq: Four times a day (QID) | ORAL | Status: AC | PRN
  Administered 2012-10-16 – 2012-10-19 (×7): 25 mg via ORAL
  Filled 2012-10-16 (×7): qty 1

## 2012-10-16 MED ORDER — NICOTINE 21 MG/24HR TD PT24
21.0000 mg | MEDICATED_PATCH | Freq: Every day | TRANSDERMAL | Status: DC
  Administered 2012-10-17 – 2012-10-22 (×6): 21 mg via TRANSDERMAL
  Filled 2012-10-16 (×9): qty 1

## 2012-10-16 MED ORDER — ACETAMINOPHEN 325 MG PO TABS: 650.0000 mg | ORAL_TABLET | Freq: Four times a day (QID) | ORAL | Status: DC | PRN

## 2012-10-16 MED ORDER — MAGNESIUM HYDROXIDE 400 MG/5ML PO SUSP: 30.0000 mL | Freq: Every day | ORAL | Status: DC | PRN

## 2012-10-16 MED ORDER — CHLORDIAZEPOXIDE HCL 25 MG PO CAPS
ORAL_CAPSULE | ORAL | Status: AC
  Administered 2012-10-16: 25 mg
  Filled 2012-10-16: qty 1

## 2012-10-16 MED ORDER — METHOCARBAMOL 500 MG PO TABS
500.0000 mg | ORAL_TABLET | Freq: Three times a day (TID) | ORAL | Status: AC | PRN
  Administered 2012-10-16 – 2012-10-19 (×6): 500 mg via ORAL
  Filled 2012-10-16 (×6): qty 1

## 2012-10-16 MED ORDER — ONDANSETRON 4 MG PO TBDP
4.0000 mg | ORAL_TABLET | Freq: Three times a day (TID) | ORAL | Status: DC | PRN
  Administered 2012-10-20: 4 mg via ORAL
  Filled 2012-10-16 (×2): qty 1

## 2012-10-16 MED ORDER — ZOLPIDEM TARTRATE 5 MG PO TABS
5.0000 mg | ORAL_TABLET | Freq: Every evening | ORAL | Status: DC | PRN
  Administered 2012-10-16 – 2012-10-21 (×4): 5 mg via ORAL
  Filled 2012-10-16 (×4): qty 1

## 2012-10-16 MED ORDER — CHLORDIAZEPOXIDE HCL 25 MG PO CAPS
25.0000 mg | ORAL_CAPSULE | Freq: Three times a day (TID) | ORAL | Status: AC
  Administered 2012-10-17 (×3): 25 mg via ORAL
  Filled 2012-10-16 (×3): qty 1

## 2012-10-16 MED ORDER — THIAMINE HCL 100 MG/ML IJ SOLN: 100.0000 mg | Freq: Once | INTRAMUSCULAR | Status: DC

## 2012-10-16 MED ORDER — VITAMIN B-1 100 MG PO TABS
100.0000 mg | ORAL_TABLET | Freq: Every day | ORAL | Status: DC
  Administered 2012-10-17 – 2012-10-22 (×6): 100 mg via ORAL
  Filled 2012-10-16 (×9): qty 1

## 2012-10-16 MED ORDER — LORAZEPAM 1 MG PO TABS
1.0000 mg | ORAL_TABLET | Freq: Three times a day (TID) | ORAL | Status: DC | PRN
  Administered 2012-10-16 – 2012-10-17 (×2): 1 mg via ORAL
  Filled 2012-10-16 (×2): qty 1

## 2012-10-16 MED ORDER — NAPROXEN 500 MG PO TABS
500.0000 mg | ORAL_TABLET | Freq: Two times a day (BID) | ORAL | Status: AC | PRN
  Administered 2012-10-16 – 2012-10-21 (×8): 500 mg via ORAL
  Filled 2012-10-16 (×8): qty 1

## 2012-10-16 MED ORDER — ADULT MULTIVITAMIN W/MINERALS CH
1.0000 | ORAL_TABLET | Freq: Every day | ORAL | Status: DC
  Administered 2012-10-20 – 2012-10-22 (×3): 1 via ORAL
  Filled 2012-10-16 (×10): qty 1

## 2012-10-16 MED ORDER — NITROFURANTOIN MONOHYD MACRO 100 MG PO CAPS
100.0000 mg | ORAL_CAPSULE | Freq: Two times a day (BID) | ORAL | Status: DC
  Administered 2012-10-16 – 2012-10-22 (×12): 100 mg via ORAL
  Filled 2012-10-16 (×18): qty 1

## 2012-10-16 NOTE — Progress Notes (Signed)
Nutrition Brief Note  Patient identified on the Malnutrition Screening Tool (MST) Report.  Wt Readings from Last 10 Encounters:  10/16/12 141 lb (63.957 kg)  06/19/12 145 lb (65.772 kg)  06/11/12 145 lb 9 oz (66.027 kg)  02/08/12 171 lb 8.3 oz (77.8 kg)  09/05/11 178 lb (80.74 kg)  11/21/10 180 lb (81.647 kg)  11/14/10 174 lb 12.8 oz (79.289 kg)  10/31/10 175 lb 1.6 oz (79.425 kg)  10/10/10 166 lb 4.8 oz (75.433 kg)  08/17/10 151 lb 5.8 oz (68.656 kg)   Body mass index is 22.77 kg/(m^2). Patient meets criteria for normal weight based on current BMI.   Pt admitted with heroin addiction, alcohol abuse, and generalized anxiety. Reports drinking 6 beers/day. Pt getting daily multivitamin and thiamine. Discussed intake PTA with patient and compared to intake presently. Pt reports eating well PTA with good appetite and stable weight. She usually eats 2 meals/day. Per weight trend, pt's weight down 30 pounds in the past 10 months (17.5% weight loss). Suspect pt with at least moderate malnutrition, however reports adequate PO intake at home.  . Current diet order is regular and pt is also offered choice of unit snacks mid-morning and mid-afternoon.  Pt is eating as desired. Does not want any nutritional supplements. Labs and medications reviewed.   No nutrition diagnosis at this time.  No nutrition interventions warranted at this time. If nutrition issues arise, please consult RD.   Levon Hedger MS, RD, LDN [phone] Pager [phone] After Hours Pager

## 2012-10-16 NOTE — Progress Notes (Signed)
Patient ID: [name], female   DOB: November 20, 1980, 32 y.o.   MRN: [id number] Pt admitted to [facility] adult unit, voluntarily,  requesting detox. Patient reports using heroine 1 bag daily for 3 months, cocaine occasionally. She reports '' I've been depressed lately, but really off all my meds and using the heroin to cope. My friend actually had to use the adrenaline kit on me where he found me overdosed on heroine. I don't want to talk about it right now '' Pt currently denies any SI /HI at this time. COWS protocol initiated. Pt search completed and no contraband found. Oriented to unit. Will continue to monitor q 15 minutes for safety.

## 2012-10-16 NOTE — BH Assessment (Signed)
Patient accepted to [facility] by MD Ladona Ridgel. Attending MD Dub Mikes, bed assigned #302-1. Social work, Community education officer.

## 2012-10-16 NOTE — ED Notes (Signed)
Patient resting, eyes closed, chest observed for rise and fall. VSS

## 2012-10-16 NOTE — ED Notes (Signed)
Patient asleep, eyes closed, chest observed for rise and fall.

## 2012-10-16 NOTE — ED Notes (Signed)
Patient c/o anxiety. Patient advised she will only be allowed Ativan every 8 hours. Patient confirms understanding.

## 2012-10-16 NOTE — Progress Notes (Signed)
Adult Psychoeducational Group Note  Date:  10/16/2012 Time:  9:34 PM  Group Topic/Focus:  AA group  Participation Level:  Active  Participation Quality:  Appropriate  Affect:  Appropriate  Cognitive:  Alert  Insight: Appropriate  Engagement in Group:  Engaged  Modes of Intervention:  Discussion  Additional Comments:    Flonnie Hailstone 10/16/2012, 9:34 PM

## 2012-10-16 NOTE — Tx Team (Signed)
Initial Interdisciplinary Treatment Plan  PATIENT STRENGTHS: (choose at least two) Ability for insight General fund of knowledge Supportive family/friends  PATIENT STRESSORS: Financial difficulties Substance abuse   PROBLEM LIST: Problem List/Patient Goals Date to be addressed Date deferred Reason deferred Estimated date of resolution  Suicide risk 10-16-12   dc        Substance abuse 10-16-12   dc                                       DISCHARGE CRITERIA:  Ability to meet basic life and health needs Improved stabilization in mood, thinking, and/or behavior Motivation to continue treatment in a less acute level of care Reduction of life-threatening or endangering symptoms to within safe limits Verbal commitment to aftercare and medication compliance  PRELIMINARY DISCHARGE PLAN: Attend aftercare/continuing care group Attend 12-step recovery group Return to previous living arrangement  PATIENT/FAMIILY INVOLVEMENT: This treatment plan has been presented to and reviewed with the patient, [name], and/or family member, .  The patient and family have been given the opportunity to ask questions and make suggestions.  [name] 10/16/2012, 6:20 PM

## 2012-10-16 NOTE — Progress Notes (Deleted)
Nutrition Brief Note  Patient identified on the Malnutrition Screening Tool (MST) Report.  Wt Readings from Last 10 Encounters:  10/16/12 141 lb (63.957 kg)  06/19/12 145 lb (65.772 kg)  06/11/12 145 lb 9 oz (66.027 kg)  02/08/12 171 lb 8.3 oz (77.8 kg)  09/05/11 178 lb (80.74 kg)  11/21/10 180 lb (81.647 kg)  11/14/10 174 lb 12.8 oz (79.289 kg)  10/31/10 175 lb 1.6 oz (79.425 kg)  10/10/10 166 lb 4.8 oz (75.433 kg)  08/17/10 151 lb 5.8 oz (68.656 kg)   Body mass index is 22.77 kg/(m^2). Patient meets criteria for normal weight based on current BMI.   Pt admitted with heroin addiction and generalized anxiety. Discussed intake PTA with patient and compared to intake presently. Pt reports eating well PTA with good appetite and stable weight. She usually eats 2 meals/day. Per weight trend, pt's weight down 30 pounds in the past 10 months (17.5% weight loss). Suspect pt with at least moderate malnutrition, however reports adequate PO intake at home.  . Current diet order is regular and pt is also offered choice of unit snacks mid-morning and mid-afternoon.  Pt is eating as desired. Does not want any nutritional supplements. Labs and medications reviewed.   No nutrition diagnosis at this time.  No nutrition interventions warranted at this time. If nutrition issues arise, please consult RD.   Levon Hedger MS, RD, LDN [phone] Pager [phone] After Hours Pager

## 2012-10-16 NOTE — ED Notes (Addendum)
Pt belongings as follows: 2 black hair ties, 1 tan hair tie, 1 brown hair tie, 1 pair black Nike sandals, 3 pair of black socks, 3 pair of blue jeans, 2 pair of black underwear, 1 pair of sweat pants with stripe down the side, 4 black shirts, 1 grey long sleeve shirt, 1 white tank top, 1 teal tank top, 1 pair grey underwear, 1 pair blue underwear, 1 pair brown underwear, 1 pair of black leggings, 1 pair of white socks, 1 blue tank top, 2 iphone chargers, 1 pair of red hospital socks, 1 black jacket, 1 lighter, 1 pair of camo underwear, [facility] folder with papers and pictures in it, 1 beach towel, 1 bag of personal hygiene products.  All belongings placed in locker number 31.  Cell phone and money locked up with security in safe.

## 2012-10-16 NOTE — ED Notes (Signed)
Patient resting, eyes closed, chest observed for rise and fall. Patient continues to ask for additional anxiety medications. Patient advised she will only be given Ativan every 8 hours.
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
## 2012-10-16 NOTE — Progress Notes (Signed)
D.  Pt with complaint of withdrawal on approach, see CIWA/COW flowsheets.  Denies SI/HI/hallucinations at this time.  Positive for evening AA group.  Interacting appropriately within milieu.  A.  Support and encouragement offered  R.  Pt remains safe on unit, will continue to monitor.

## 2012-10-16 NOTE — Consult Note (Signed)
Note reviewed and agreed with  

## 2012-10-16 NOTE — ED Notes (Signed)
Patient resting, eyes closed, chest observed for rise and fall 

## 2012-10-16 NOTE — ED Notes (Signed)
Report called to Mardella Layman, RN at behavioral health center. Pt stable at time of discharge.

## 2012-10-16 NOTE — ED Notes (Signed)
Pt mother Britta Mccreedy has called requesting that her daughter be kept here so that she can have her admitted to [facility] for drug rehab. Informed pt that I can not give any information on the pt and that pt is alert and oriented and will have to call her if she wants to stay. Informed her that pt is voluntary and that we can not keep pt against her will. Mother ok with the information and stated that she would be over here around 1045-1100; mother did not want to speak to the pt.

## 2012-10-16 NOTE — ED Notes (Signed)
Per mother, states she has arranged for "Trey Paula" a counselor at [facility] to come and assess patient for program-states daughter minimizes her addiction and "overdosed" twice last week.  States she needs psychological help as well.  Informed mother I would document info in chart. Patient's RN informed, Marcelle Smiling

## 2012-10-17 DIAGNOSIS — F1020 Alcohol dependence, uncomplicated: Secondary | ICD-10-CM

## 2012-10-17 MED ORDER — GABAPENTIN 100 MG PO CAPS
200.0000 mg | ORAL_CAPSULE | Freq: Three times a day (TID) | ORAL | Status: DC
  Administered 2012-10-17 – 2012-10-20 (×8): 200 mg via ORAL
  Filled 2012-10-17 (×13): qty 2

## 2012-10-17 MED ORDER — RISPERIDONE 0.5 MG PO TABS
0.5000 mg | ORAL_TABLET | Freq: Two times a day (BID) | ORAL | Status: DC
  Administered 2012-10-17 – 2012-10-20 (×6): 0.5 mg via ORAL
  Filled 2012-10-17 (×10): qty 1

## 2012-10-17 NOTE — [facility] Group Notes (Signed)
[facility] Group Notes: (Clinical Social Work)   10/17/2012      Type of Therapy:  Group Therapy   Participation Level:  Did Not Attend    Ambrose Mantle, LCSW 10/17/2012, 12:52 PM

## 2012-10-17 NOTE — H&P (Signed)
Pt was interviewed by this provider. NP assessment was reviewed, and I concur with H&P and treatment plan at this time. 

## 2012-10-17 NOTE — Progress Notes (Signed)
Psychoeducational Group Note  Date:  10/17/2012 Time:  2015 Group Topic/Focus:  wrap up group  Participation Level: Did Not Attend  Participation Quality:  Not Applicable  Affect:  Not Applicable  Cognitive:  Not Applicable  Insight:  Not Applicable  Engagement in Group: Not Applicable  Additional Comments:  Pt remained in bed during group.  Shelah Lewandowsky 10/17/2012, 11:59 PM

## 2012-10-17 NOTE — Progress Notes (Signed)
Patient ID: [name], female   DOB: [birth year], 32 y.o.   MRN: [id number] D. Pt presents with depressed, anxious mood. Pt states ''I'm withdrawing real bad, give me everything you can give me, I'm nauseous and I'm anxious and aching all over and I want to go back to bed '' Pt completed self inventory and rated depression at 10/10 on depression scale, 10 being worst depression. Pt denies any SI. A. Support encouragement provided. Allowed pt to ventilate. Medications given as ordered. Encouraged group attendance. Pt has been isolative to room throughout shift thus far. R. Currently resting quietly, will continue to monitor q 15 minutes for safety.
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
## 2012-10-17 NOTE — Progress Notes (Signed)
D.  Pt pleasant, sleepy on approach.  Still complaint of withdrawal anxiety and insomnia.  Denies SI/HI/hallucinations at this time.  Interacting appropriately within milieu.  Did not attend evening wrap up group, remained in bed not feeling well.  A.  Support and encouragement offered, medication given as ordered for withdrawal symptoms.  R.  Pt remains safe, no acute distress, will continue to monitor.

## 2012-10-17 NOTE — [facility] Suicide Risk Assessment (Signed)
Suicide Risk Assessment  Admission Assessment     Nursing information obtained from:  Patient Demographic factors:  Adolescent or young adult;Low socioeconomic status;Unemployed Current Mental Status:  Self-harm behaviors Loss Factors:  Financial problems / change in socioeconomic status Historical Factors:  Impulsivity Risk Reduction Factors:  Responsible for children under 34 years of age;Sense of responsibility to family  CLINICAL FACTORS:   Severe Anxiety and/or Agitation  COGNITIVE FEATURES THAT CONTRIBUTE TO RISK:  Loss of executive function    SUICIDE RISK:   Moderate:  Frequent suicidal ideation with limited intensity, and duration, some specificity in terms of plans, no associated intent, good self-control, limited dysphoria/symptomatology, some risk factors present, and identifiable protective factors, including available and accessible social support.  PLAN OF CARE:  I certify that inpatient services furnished can reasonably be expected to improve the patient's condition.  Ancil Linsey 10/17/2012, 1:55 PM

## 2012-10-17 NOTE — [facility] Group Notes (Signed)
[facility] Group Notes:  (Nursing/MHT/Case Management/Adjunct)  Date:  10/17/2012  Time:  10:23 AM  Type of Therapy:  Psychoeducational Skills  Participation Level:  Did Not Attend  Participation Quality:  na  Affect:  na  Cognitive:  na  Insight:  None  Engagement in Group:  na  Modes of Intervention:  na  Summary of Progress/Problems: self inventory review with RN and healthy coping skills review with RN . Pt did not attend.  Malva Limes 10/17/2012, 10:23 AM

## 2012-10-18 DIAGNOSIS — F1020 Alcohol dependence, uncomplicated: Principal | ICD-10-CM

## 2012-10-18 DIAGNOSIS — F411 Generalized anxiety disorder: Secondary | ICD-10-CM

## 2012-10-18 MED ORDER — BISACODYL 5 MG PO TBEC
5.0000 mg | DELAYED_RELEASE_TABLET | Freq: Every day | ORAL | Status: DC | PRN
  Administered 2012-10-18 – 2012-10-19 (×2): 5 mg via ORAL
  Filled 2012-10-18 (×2): qty 1
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
## 2012-10-18 NOTE — Progress Notes (Signed)
Patient did attend the first 15 minutes of the evening speaker AA meeting. Pt remained in her room for the remainder of the meeting.

## 2012-10-18 NOTE — Progress Notes (Signed)
Psychoeducational Group Note  Date:  10/18/2012 Time:  0945 am  Group Topic/Focus:  Identifying Needs:   The focus of this group is to help patients identify their personal needs that have been historically problematic and identify healthy behaviors to address their needs.  Participation Level:  Did Not Attend   Andrena Mews 10/18/2012,6:11 PM

## 2012-10-18 NOTE — Progress Notes (Signed)
PA note reviewed, and I agree with plan.
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
## 2012-10-18 NOTE — Progress Notes (Signed)
Patient ID: [name], female   DOB: 25-Mar-1980, 32 y.o.   MRN: [id number] D. Pt presents with depressed , anxious mood. Affect blunted. Patient states ''I'm feeling better today, for the first time in a long time there is color in my face, and my arms look good. I'm still anxious and depressed but I'm feeling better '' Pt has been interactive in the milieu and did attend unit programming thus far with encouragement. A. Support and encouragement provided. Held am dose of clonidine and pt stated to staff '' can't you just write down a fake blood pressure so I can take it ? '' Medication education given. R. Pt currently calm and cooperative, attending group. Will continue to monitor q 15 minutes for safety.

## 2012-10-18 NOTE — [facility] Group Notes (Signed)
[facility] Group Notes:  (Nursing/MHT/Case Management/Adjunct)  Date:  10/18/2012  Time:  10:13 AM  Type of Therapy:  Psychoeducational Skills  Participation Level:  Active  Participation Quality:  Appropriate  Affect:  Blunted  Cognitive:  Alert and Appropriate  Insight:  Improving  Engagement in Group:  Engaged and Improving  Modes of Intervention:  Discussion, Education and Exploration  Summary of Progress/Problems: psychoeducational review of coping skills, self inventory review with RN. Pt did attend group and stated '' I've been down this path before, I came in here to get help and I need to learn what I need to do so I can have a life back and stop using drugs just to sit around the house . I want to get my son back, get a job [name], [name] 10/18/2012, 10:13 AM

## 2012-10-18 NOTE — [facility] Group Notes (Signed)
[facility] Group Notes:  (Clinical Social Work)  10/18/2012  10:00-11:00AM  Summary of Progress/Problems:   The main focus of today's process group was to   identify the patient's current support system and decide on other supports that can be put in place.  The picture on workbook was used to discuss why additional supports are needed, and a hand-out was distributed with four definitions/levels of support, then used to talk about how patients have given and received all different kinds of support.  An emphasis was placed on using counselor, doctor, therapy groups, 12-step groups, and problem-specific support groups to expand supports.  The patient identified one additional support as being going to sit in her church when nobody is there.  She stated her mother and family are already supportive.  She also talked at length about just moving to this area, and not knowing where to go for her stenosis, where to go for other issues.  She said she forgets to ask all the time.  CSW asked student nurse to get her a journal and encouraged her to write down concerns and take with her to meet with social worker tomorrow morning.  Type of Therapy:  Process Group with Motivational Interviewing  Participation Level:  Active  Participation Quality:  Intrusive, Monopolizing and Redirectable  Affect:  Anxious and Blunted  Cognitive:  Disorganized  Insight:  Improving  Engagement in Therapy:  Developing/Improving  Modes of Intervention:   Education, Support and Processing, Activity  Ambrose Mantle, LCSW 10/18/2012, 12:25 PM

## 2012-10-18 NOTE — Progress Notes (Signed)
Psychoeducational Group Note  Date:  10/18/2012 Time:  0945 am  Group Topic/Focus:  Making Healthy Choices:   The focus of this group is to help patients identify negative/unhealthy choices they were using prior to admission and identify positive/healthier coping strategies to replace them upon discharge.  Participation Level:  Active  Participation Quality:  Appropriate  Affect:  Appropriate  Cognitive:  Alert  Insight:  Developing/Improving  Engagement in Group:  Developing/Improving  Additional Comments:    Andrena Mews 10/18/2012, 10:29 AM
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
## 2012-10-19 MED ORDER — HYDROXYZINE HCL 25 MG PO TABS
25.0000 mg | ORAL_TABLET | Freq: Four times a day (QID) | ORAL | Status: DC | PRN
  Administered 2012-10-19 – 2012-10-20 (×3): 25 mg via ORAL
  Filled 2012-10-19 (×2): qty 1

## 2012-10-19 MED ORDER — MAGNESIUM CITRATE PO SOLN
1.0000 | Freq: Once | ORAL | Status: AC
  Administered 2012-10-19: 1 via ORAL

## 2012-10-19 NOTE — [facility] Group Notes (Signed)
[facility] LCSW Aftercare Discharge Planning Group Note   10/19/2012 9:39 AM  Participation Quality:  DID NOT ATTEND   Smart, Herbert Seta

## 2012-10-19 NOTE — Progress Notes (Signed)
Pt reports she is doing ok today.  She has c/o headache and was given Naproxen 500mg  shortly after 1900.  Pt says her headache is still an 8 and asks for another dose of Naproxen.  Explained that the dose she was given was a prescription dose and that it was too early for another med.  Encouraged pt to relax and let the medication take affect.  Pt denies SI/HI/AV.  She denies any withdrawal symptoms at this time.  Pt is unsure what her discharge plans are.  Pt makes her needs known to staff.  Support and encouragement offered.  Safety maintained with q15 minute checks.

## 2012-10-19 NOTE — Tx Team (Signed)
Interdisciplinary Treatment Plan Update (Adult)  Date: 10/19/2012   Time Reviewed: 11:43 AM  Progress in Treatment:  Attending groups:No.  Participating in groups:  No.  Taking medication as prescribed: Yes  Tolerating medication: Yes  Family/Significant othe contact made: No. SPE not required for this pt.  Patient understands diagnosis: Yes, AEB seeking treatment for heroin detox and medication stabilization.  Discussing patient identified problems/goals with staff: Yes  Medical problems stabilized or resolved: Yes  Denies suicidal/homicidal ideation: Yes during admission and self report.  Patient has not harmed self or Others: Yes  New problem(s) identified: n/a  Discharge Plan or Barriers: Pt currently not attending d/c planning group. CSW assessing for appropriate referrals.  Additional comments: [name] is an 32 y.o. female who presents seeking detox. She reports daily use of heroin 2 bags and alcohol-at least 6 beers. She states she is seeking detox because during her last use of heroin she overdosed and her friend had to save her with an overdose kit in his car. She states that was her last use of heroin-a day ago-and that she had a beer this morning before coming to the hospital. She has never done inpatient treatment before, but has done IOP in [place] several years ago. She denies SI/HI/AVH or any history of violence or self harm, but does endorse severe anxiety and daily panic attacks, for which she reports she is self medicating with drugs. In addition to using heroin and alcohol, she reports taking pills whenever possible and ocasional cocaine use. She is calm cooperative and appropriate for treatment. Reason for Continuation of Hospitalization: Clonidine taper-withdrawals Mood stabilization Medication management  Estimated length of stay: 3-4 days   For review of initial/current patient goals, please see plan of care.  Attendees:  Patient:    Family:    Physician: Dr.  Daleen Bo MD 10/19/2012 11:43 AM   Nursing: Mardella Layman RN  10/19/2012 11:43 AM   Clinical Social Worker Aliscia Clayton Smart, LCSWA  10/19/2012 11:43 AM   Other: Jan RN 10/19/2012 11:43 AM   Other:    Other: Darden Dates Nurse CM  10/19/2012 11:43 AM   Other:    Scribe for Treatment Team:  [employer] LCSWA  10/19/2012 11:43 AM

## 2012-10-19 NOTE — [facility] Suicide Risk Assessment (Signed)
[facility] INPATIENT: Family/Significant Other Suicide Prevention Education  Suicide Prevention Education:  Education Completed; No one has been identified by the patient as the family member/significant other with whom the patient will be residing, and identified as the person(s) who will aid the patient in the event of a mental health crisis (suicidal ideations/suicide attempt).   Pt did not c/o SI at admission, nor have they endorsed SI during their stay here. SPE not required. SPI pamphlet given to pt and she was encouraged to ask questions, talk about concerns, and share with her support network.   [employer], LCSWA 10/19/2012 4:07 PM
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
## 2012-10-19 NOTE — B[facility] Group Notes (Signed)
[facility] LCSW Group Therapy  10/19/2012 3:30 PM  Type of Therapy:  Group Therapy  Participation Level:  Did Not Attend  Smart, Linsy Ehresman 10/19/2012, 3:30 PM

## 2012-10-19 NOTE — Progress Notes (Signed)
Patient ID: Ayme Short, female   DOB: 23-Jan-1980, 32 y.o.   MRN: [id number] D. Pt presents with anxious mood, affect appropriate. Patient states '' I'm just still having withdrawal symptoms, I need whatever I can get , give me everything '' Patient completed self inventory and rates depression at 5/10 on depression scale, 10 being worst depression 1 being least. She also reports poor sleep, and various withdrawal symptoms. Discussed patients plans after discharge, and patients substance abuse severity and discussed residential treatment facility. Patients mother phoned and reports '' I'm afraid my daughter will die if she doesn't get into a long term place due to how many times she's been through detox, she almost died from overdoses several times. '' Patient continues with poor insight stating ''i'll be fine, i can go home and just stop hanging around who I was hanging with . ''Also reports continued constipation- discussed with MD and orders received. A. Discussed above information with treatment team and Dr. Daleen Bo. Medications given as ordered, support and encouragement provided. R. Pt currently in treatment programming. Will continue to monitor q 15 minutes for safety.

## 2012-10-19 NOTE — Progress Notes (Signed)
Adult Psychoeducational Group Note  Date:  10/19/2012 Time:  11:12 AM  Group Topic/Focus:  Wellness Toolbox:   The focus of this group is to discuss various aspects of wellness, balancing those aspects and exploring ways to increase the ability to experience wellness.  Patients will create a wellness toolbox for use upon discharge.  Participation Level:  Active  Participation Quality:  Sharing  Affect:  Appropriate  Cognitive:  Alert  Insight: Appropriate and Good  Engagement in Group:  Engaged  Modes of Intervention:  Discussion and Education  Additional Comments:  Pt was very engaged and active during group session. Pt discussed some of her goals which included environmental, emotional, physical, spiritual and intellectual goals.   [place] Shi K 10/19/2012, 11:12 AM

## 2012-10-19 NOTE — Progress Notes (Signed)
Patient ID: [name], female   DOB: April 07, 1980, 32 y.o.   MRN: [id number] PER STATE REGULATIONS 482.30  THIS CHART WAS REVIEWED FOR MEDICAL NECESSITY WITH RESPECT TO THE PATIENT'S ADMISSION/ DURATION OF STAY.  NEXT REVIEW DATE:10/23/2012   Willa Rough, RN, BSN CASE MANAGER

## 2012-10-20 DIAGNOSIS — F1010 Alcohol abuse, uncomplicated: Secondary | ICD-10-CM

## 2012-10-20 DIAGNOSIS — F1420 Cocaine dependence, uncomplicated: Secondary | ICD-10-CM

## 2012-10-20 DIAGNOSIS — F329 Major depressive disorder, single episode, unspecified: Secondary | ICD-10-CM

## 2012-10-20 MED ORDER — HYDROXYZINE HCL 50 MG PO TABS
50.0000 mg | ORAL_TABLET | Freq: Three times a day (TID) | ORAL | Status: DC
  Administered 2012-10-20 – 2012-10-22 (×7): 50 mg via ORAL
  Filled 2012-10-20 (×4): qty 1
  Filled 2012-10-20 (×3): qty 12
  Filled 2012-10-20 (×5): qty 1

## 2012-10-20 MED ORDER — RISPERIDONE 0.5 MG PO TABS
0.5000 mg | ORAL_TABLET | Freq: Once | ORAL | Status: AC
  Administered 2012-10-20: 0.5 mg via ORAL
  Filled 2012-10-20 (×2): qty 1

## 2012-10-20 MED ORDER — HYDROXYZINE HCL 50 MG PO TABS
50.0000 mg | ORAL_TABLET | Freq: Once | ORAL | Status: AC
  Administered 2012-10-20: 50 mg via ORAL
  Filled 2012-10-20 (×2): qty 1

## 2012-10-20 MED ORDER — RISPERIDONE 1 MG PO TABS
1.0000 mg | ORAL_TABLET | Freq: Two times a day (BID) | ORAL | Status: DC
  Administered 2012-10-20 – 2012-10-21 (×2): 1 mg via ORAL
  Filled 2012-10-20 (×4): qty 1

## 2012-10-20 MED ORDER — GABAPENTIN 100 MG PO CAPS
100.0000 mg | ORAL_CAPSULE | Freq: Once | ORAL | Status: AC
  Administered 2012-10-20: 100 mg via ORAL
  Filled 2012-10-20 (×2): qty 1

## 2012-10-20 MED ORDER — GABAPENTIN 300 MG PO CAPS
300.0000 mg | ORAL_CAPSULE | Freq: Three times a day (TID) | ORAL | Status: DC
  Administered 2012-10-20 – 2012-10-22 (×7): 300 mg via ORAL
  Filled 2012-10-20 (×3): qty 1
  Filled 2012-10-20 (×2): qty 12
  Filled 2012-10-20 (×5): qty 1
  Filled 2012-10-20: qty 12
  Filled 2012-10-20: qty 1

## 2012-10-20 NOTE — Progress Notes (Signed)
The focus of this group is to educate the patient on the purpose and policies of crisis stabilization and provide a format to answer questions about their admission.  The group details unit policies and expectations of patients while admitted. Patient attended and was engaging.

## 2012-10-20 NOTE — Progress Notes (Signed)
Recreation Therapy Notes  Date: 10.07.2014 Time: 3:00pm Location: [address] Dayroom  Group Topic: Communication, Team Building, Problem Solving  Goal Area(s) Addresses:  Patient will effectively work with peer towards shared goal.  Patient will identify skill used to make activity successful.  Patient will identify how skills used during activity can be used to reach post d/c goals.   Behavioral Response: Did not attend  Loralee Weitzman L Merik Mignano, LRT/CTRS  Freida Nebel L 10/20/2012 4:29 PM 

## 2012-10-20 NOTE — Progress Notes (Signed)
D: Patient denies SI/HI and A/V hallucinations; patient reports sleep is poor and had medication to take; reports appetite is good ; reports energy level is low ; reports ability to pay attention is poor; rates depression as 5/10; rates hopelessness -/10; patient complaining of a lot of anxiety and reports she was having an anxiety attack today and nurse practitioner saw her and ordered medication to be given at 1045 hrs; patient reports some cravings, tremors, and chills  A: Monitored q 15 minutes; patient encouraged to attend groups; patient educated about medications; patient given medications per physician orders; patient encouraged to express feelings and/or concerns  R: Patient reports relief of anxiety; patient is cooperative and engaging; patient's interaction with staff and peers is appropriate; no tremors were observed or felt; patient was able to set goal to talk with staff 1:1 when having feelings of SI; patient is taking medications as prescribed and tolerating medications; patient is attending all groups

## 2012-10-20 NOTE — [facility] Group Notes (Signed)
[facility] LCSW Group Therapy  10/20/2012 1:38 PM  Type of Therapy:  Group Therapy  Participation Level:  Active  Participation Quality:  Appropriate  Affect:  Appropriate  Cognitive:  Alert and Oriented  Insight:  Improving  Engagement in Therapy:  Engaged  Modes of Intervention:  Discussion, Education, Exploration, Socialization and Support  Summary of Progress/Problems: MHA Speaker came to talk about his personal journey with substance abuse and addiction. The pt processed ways by which to relate to the speaker. MHA speaker provided handouts and educational information pertaining to groups and services offered by the [facility]. [name] was attentive and engaged throughout today's therapy group. She actively listened as speaker shared his personal story. [name] shared her view of AA "it's like an alcoholic's church" when speaker shared his positive experiences with AA/supports. [name] shows progress in the group setting AEB her ability to attentively listen and actively participate in group.   Smart, [name] 10/20/2012, 1:38 PM

## 2012-10-20 NOTE — Progress Notes (Signed)
Pt observed in the dayroom watching TV and talking with other patients.  Pt reports she is still having anxiety and frequently asks for medication for such.  Informed pt that the MD had changed/increased her medications to address her anxiety.  Pt voiced understanding.  Pt denies SI/HI/AV.  Pt is not c/o any other withdrawal symptoms at this time.  Pt says she has been going to groups.  She is not sure what she wants to do after detox.  Her mother wants her to go to long term treatment.  Plans are incomplete.  Pt makes her needs known to staff.  Support and encouragement offered.  Safety maintained with q15 minute checks.

## 2012-10-20 NOTE — Progress Notes (Signed)
Recreation Therapy Notes  Date: 10.07.2014 Time: 2:30pm Location: [address] Dayroom  Group Topic: Software engineer Activities (AAA)  Behavioral Response: Did not attend.    Marykay Lex Taylia Berber, LRT/CTRS  Jearl Klinefelter 10/20/2012 4:17 PM
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
## 2012-10-20 NOTE — Progress Notes (Signed)
Adult Psychoeducational Group Note  Date:  10/20/2012 Time:  1:52 PM  Group Topic/Focus:  Recovery Goals:   The focus of this group is to identify appropriate goals for recovery and establish a plan to achieve them.  Participation Level:  Minimal  Participation Quality:  Appropriate and Attentive  Affect:  Appropriate  Cognitive:  Alert and Appropriate  Insight: Good  Engagement in Group:  Engaged  Modes of Intervention:  Discussion, Exploration, Socialization and Support  Additional Comments:  Pt came to group and shared that negative people and lack of therapy are standing between her and recovery. Pt plans on changing this by cancelling her old phone number and getting a new one and making a new social circle and going to an intensive outpatient program and staying on her medications.   [name] 10/20/2012, 1:52 PM

## 2012-10-21 DIAGNOSIS — F1110 Opioid abuse, uncomplicated: Secondary | ICD-10-CM

## 2012-10-21 DIAGNOSIS — F1210 Cannabis abuse, uncomplicated: Secondary | ICD-10-CM

## 2012-10-21 MED ORDER — RISPERIDONE 0.5 MG PO TABS
0.5000 mg | ORAL_TABLET | Freq: Two times a day (BID) | ORAL | Status: DC
  Administered 2012-10-21 – 2012-10-22 (×2): 0.5 mg via ORAL
  Filled 2012-10-21 (×3): qty 1
  Filled 2012-10-21 (×2): qty 8
  Filled 2012-10-21: qty 1

## 2012-10-21 NOTE — Progress Notes (Signed)
Pt reports she is still having anxiety.  Pt is up for discharge tomorrow.  Pt is not having any withdrawal symptoms at this time.  Pt denies SI/HI/AV.  Support and encouragement offered.  Safety maintained with q15 minute checks.

## 2012-10-21 NOTE — [facility] Group Notes (Signed)
[facility] LCSW Aftercare Discharge Planning Group Note   10/21/2012 9:15 AM  Participation Quality:  Appropriate   Mood/Affect:  Blunted  Depression Rating:  0  Anxiety Rating:  9  Thoughts of Suicide:  No Will you contract for safety?   NA  Current AVH:  No  Plan for Discharge/Comments:  Pt stated that her mother IS NOT her legal guaridan but is her beneficiary. Pt reports that she is on disability and has Medicare. Pt requesting to be set up with [facility] program and med management. Pt reports that [facility] is within walking distance from her home. Pt stated that she has been sleeping heavily over the past few days but feels more clear and oriented this morning.   Transportation Means: Pt able to walk home.   Supports: pt's mother.   Smart, [employer]
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
## 2012-10-21 NOTE — Clinical Social Work Note (Signed)
CSW met with pt and pt's mother during lunch. CSW discussed options for pt. Pt first choice is [facility] and med management. CSW left message for Dewayne Hatch to meet with pt today in order to schedule assessment for this service. Pt also open to [facility] and has admission date for October 15th. Pt stated that if she feels she cannot handle her cravings upon d/c tomorrow, she will go to [facility] on this date for admission. Pt signed release/will follow up at [facility] for med management if she decides to go to [facility]. Pt's mother given information about both facilities by CSW.

## 2012-10-21 NOTE — Progress Notes (Signed)
AA Group 

## 2012-10-21 NOTE — Progress Notes (Signed)
0751hrs

## 2012-10-21 NOTE — Progress Notes (Signed)
D: Patient denies SI/HI and A/V hallucinations; patient reports sleep is poor but she requested medication; reports appetite is good ; reports energy level is low ; reports ability to pay attention is poor; rates depression as 3/10; rates hopelessness 5/10;   A: Monitored q 15 minutes; patient encouraged to attend groups; patient educated about medications; patient given medications per physician orders; patient encouraged to express feelings and/or concerns  R: Patient is very concerned about being discharge; patient's interaction with staff and peers is appropriate;  patient is taking medications as prescribed and tolerating medications; patient is attending all groups   Patient signed a 72 hour discharge at 10/21/12 at 1156hrs

## 2012-10-21 NOTE — Progress Notes (Signed)
Adult Psychoeducational Group Note  Date:  10/21/2012 Time:  12:39 PM  Group Topic/Focus:  Emotional Education:   The focus of this group is to discuss what feelings/emotions are, and how they are experienced.  Participation Level:  Minimal  Participation Quality:  Appropriate  Affect:  Appropriate  Cognitive:  Appropriate  Insight: Good  Engagement in Group:  Improving  Modes of Intervention:  Education  Additional Comments:  Pts watched a video "The Sober Life- My Attitude". Pt where encouraged to discuss their feelings,opinions, and what they learned from watching the video.    Tora Perches N 10/21/2012, 12:39 PM
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
## 2012-10-22 DIAGNOSIS — F319 Bipolar disorder, unspecified: Secondary | ICD-10-CM

## 2012-10-22 MED ORDER — ZOLPIDEM TARTRATE 5 MG PO TABS: 5.0000 mg | ORAL_TABLET | Freq: Every evening | ORAL | Status: DC | PRN

## 2012-10-22 MED ORDER — GABAPENTIN 300 MG PO CAPS: 300.0000 mg | ORAL_CAPSULE | Freq: Three times a day (TID) | ORAL | Status: DC

## 2012-10-22 MED ORDER — SERTRALINE HCL 50 MG PO TABS: 50.0000 mg | ORAL_TABLET | Freq: Every day | ORAL | Status: DC

## 2012-10-22 MED ORDER — TRAZODONE HCL 50 MG PO TABS: 50.0000 mg | ORAL_TABLET | Freq: Every evening | ORAL | Status: DC | PRN

## 2012-10-22 MED ORDER — HYDROXYZINE HCL 50 MG PO TABS: 50.0000 mg | ORAL_TABLET | Freq: Three times a day (TID) | ORAL | Status: DC

## 2012-10-22 MED ORDER — RISPERIDONE 0.5 MG PO TABS: 0.5000 mg | ORAL_TABLET | Freq: Two times a day (BID) | ORAL | Status: DC

## 2012-10-22 NOTE — [facility] Group Notes (Signed)
[facility] LCSW Group Therapy  10/22/2012 1:49 PM  Type of Therapy:  Group Therapy  Participation Level:  Did Not Attend  Smart, Litzy Dicker 10/22/2012, 1:49 PM
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
## 2012-10-22 NOTE — Progress Notes (Addendum)
[facility] Adult Case Management Discharge Plan :  Will you be returning to the same living situation after discharge: Yes,  home At discharge, do you have transportation home?:Yes,  pt to walk home from hospital Do you have the ability to pay for your medications:Yes,  Medicare.  Release of information consent forms completed and in the chart;  Patient's signature needed at discharge.  Patient to Follow up at: Follow-up Information   Follow up with [facility]  On 10/28/2012. (Arrive by 8AM for admission. Please bring ID, 30 day medication supply, and clothing. NOTE: If you choose not to follow up at [facility], please call the number above immediately to cancel admission.  )    Contact information:   [address] [place], [place] [address] Phone: [phone] Fax: [phone]      Follow up with [facility] On 10/27/2012. (Assessment with Ann for CD IOP and medication management at 10AM. If you choose to go to [facility], please call [phone] at least 48 hours before appt time in order to cancel. )    Contact information:   [address]  [place], [place] [address] Phone: [phone] Fax: [phone]      Follow up with [facility]. (If following up at [facility], please arrive between 8AM-9AM Monday through Friday for hospital followup/medication management. )    Contact information:   [address], [place] [address] Phone: [phone] Fax: [phone]      Patient denies SI/HI:   Yes,  during admission, group, and self report.    Safety Planning and Suicide Prevention discussed:  Yes,  SPE not required for this pt. SPI pamphlet provided to pt and she was encouraged to share with her support network, ask questions, and talk about concerns.   Smart, Gerilyn Stargell 10/22/2012, 9:21 AM  APPT changed to 10:30Am on 10/10 for [facility]. PT made aware/visited by Cameron Ali.
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
## 2012-10-22 NOTE — Progress Notes (Signed)
Discharge Note:  Patient discharged home with mother.  Denied SI and HI.  Denied A/V hallucinations.  Denied pain.  Patient was given suicide prevention information which was discussed with her and she stated she understood and had no questions.  Patient received all her belongings, medications, prescriptions, clothing, miscellaneous toiletries.  Patient stated she appreciated all assistance received from [facility] staff.

## 2012-10-22 NOTE — Progress Notes (Signed)
Patient denied SI and HI.  Denied A/V hallucinations.  Denied pain.  Stated she is looking forward to discharge this afternoon.

## 2012-10-22 NOTE — Progress Notes (Signed)
Recreation Therapy Notes  Date: 10.09.2014 Time: 2:30pm Location: [address] Dayroom   Group Topic: Software engineer Activities (AAA)  Behavioral Response: Engaged, Appropriate  Affect: Euthymic  Clinical Observations/Feedback: Dog Team: [employer] Patient interacted appropriately with peer, dog team, LRT and MHT.   Marykay Lex Reizy Dunlow, LRT/CTRS  Bonner Larue L 10/22/2012 4:31 PM

## 2012-10-22 NOTE — Progress Notes (Signed)
The focus of this group is to educate the patient on the purpose and policies of crisis stabilization and provide a format to answer questions about their admission.  The group details unit policies and expectations of patients while admitted. Patient attended the group and was not engaged.

## 2012-10-22 NOTE — Progress Notes (Signed)
Adult Psychoeducational Group Note  Date:  10/22/2012 Time:  1:41 PM  Group Topic/Focus:  Building Self Esteem:   The Focus of this group is helping patients become aware of the effects of self-esteem on their lives, the things they and others do that enhance or undermine their self-esteem, seeing the relationship between their level of self-esteem and the choices they make and learning ways to enhance self-esteem.  Participation Level:  Active  Participation Quality:  Attentive  Affect:  Appropriate  Cognitive:  Alert  Insight: Lacking  Engagement in Group:  Distracting and Engaged  Modes of Intervention:  Activity, Discussion, Exploration, Socialization and Support  Additional Comments:  Pt came to group and shared that being cheated on and criticism are effecting her self-esteem. Pt plans on changing this by not dating as many people and going to therapy and staying on her medications. Pt was distracting during group and laughing and making jokes, but was redirectable.   Cathlean Cower 10/22/2012, 1:41 PM

## 2012-10-26 NOTE — Clinical Social Work Note (Signed)
Pt's mother called CSW stating that pt had been arrested and would like CSW to provide letter stating her hospital stay, diagnosis, meds, and intended treatment plan. CSW unable to provide this information (HIPPA violation) but provided generic letter [facility]) providing hospital admission/discharge date (per [facility]). Copy saved in chart/EPIC. Pt's mother to pick up letter at 11AM. Subpoena needed for additional information.

## 2012-10-27 NOTE — Progress Notes (Addendum)
Patient Discharge Instructions:  After Visit Summary (AVS):   Faxed to:  10/27/12 Discharge Summary Note:   Faxed to:  10/27/12 Psychiatric Admission Assessment Note:   Faxed to:  10/27/12 Suicide Risk Assessment - Discharge Assessment:   Faxed to:  10/27/12 Faxed/Sent to the Next Level Care provider:  10/27/12 Next Level Care Provider Has Access to the EMR, 10/27/12 Faxed to [facility] @ [phone] Faxed to [facility] @ [phone] Records provided to [facility] via CHL/Epic access.  Jerelene Redden, 10/27/2012, 3:40 PM

## 2012-11-17 ENCOUNTER — Encounter (HOSPITAL_COMMUNITY): Payer: Self-pay | Admitting: Emergency Medicine

## 2012-11-17 ENCOUNTER — Emergency Department (HOSPITAL_COMMUNITY)
Admission: EM | Admit: 2012-11-17 | Discharge: 2012-11-18 | Disposition: A | Discharge status: Home / Self Care | Payer: Medicare Other | Attending: Emergency Medicine | Admitting: Emergency Medicine

## 2012-11-17 DIAGNOSIS — Z87440 Personal history of urinary (tract) infections: Secondary | ICD-10-CM | POA: Insufficient documentation

## 2012-11-17 DIAGNOSIS — Z8619 Personal history of other infectious and parasitic diseases: Secondary | ICD-10-CM | POA: Insufficient documentation

## 2012-11-17 DIAGNOSIS — Z79899 Other long term (current) drug therapy: Secondary | ICD-10-CM | POA: Insufficient documentation

## 2012-11-17 DIAGNOSIS — Z8739 Personal history of other diseases of the musculoskeletal system and connective tissue: Secondary | ICD-10-CM | POA: Insufficient documentation

## 2012-11-17 DIAGNOSIS — F319 Bipolar disorder, unspecified: Secondary | ICD-10-CM | POA: Insufficient documentation

## 2012-11-17 DIAGNOSIS — F1310 Sedative, hypnotic or anxiolytic abuse, uncomplicated: Secondary | ICD-10-CM | POA: Insufficient documentation

## 2012-11-17 DIAGNOSIS — F411 Generalized anxiety disorder: Secondary | ICD-10-CM | POA: Insufficient documentation

## 2012-11-17 DIAGNOSIS — F1190 Opioid use, unspecified, uncomplicated: Secondary | ICD-10-CM | POA: Diagnosis present

## 2012-11-17 DIAGNOSIS — F1410 Cocaine abuse, uncomplicated: Secondary | ICD-10-CM | POA: Insufficient documentation

## 2012-11-17 DIAGNOSIS — F209 Schizophrenia, unspecified: Secondary | ICD-10-CM | POA: Insufficient documentation

## 2012-11-17 DIAGNOSIS — F1010 Alcohol abuse, uncomplicated: Secondary | ICD-10-CM | POA: Insufficient documentation

## 2012-11-17 DIAGNOSIS — F17200 Nicotine dependence, unspecified, uncomplicated: Secondary | ICD-10-CM | POA: Insufficient documentation

## 2012-11-17 DIAGNOSIS — Z3202 Encounter for pregnancy test, result negative: Secondary | ICD-10-CM | POA: Insufficient documentation

## 2012-11-17 DIAGNOSIS — F1910 Other psychoactive substance abuse, uncomplicated: Secondary | ICD-10-CM

## 2012-11-17 LAB — COMPREHENSIVE METABOLIC PANEL
ALT: 12 U/L (ref 0–35)
Albumin: 3.9 g/dL (ref 3.5–5.2)
Alkaline Phosphatase: 69 U/L (ref 39–117)
CO2: 22 mEq/L (ref 19–32)
Creatinine, Ser: 0.95 mg/dL (ref 0.50–1.10)
GFR calc Af Amer: 90 mL/min — ABNORMAL LOW (ref >90)
GFR calc non Af Amer: 77 mL/min — ABNORMAL LOW (ref >90)
Glucose, Bld: 94 mg/dL (ref 70–99)
Potassium: 3.5 mEq/L (ref 3.5–5.1)
Sodium: 135 mEq/L (ref 135–145)

## 2012-11-17 LAB — RAPID URINE DRUG SCREEN, HOSP PERFORMED
Barbiturates: NOT DETECTED
Opiates: NOT DETECTED

## 2012-11-17 LAB — POCT PREGNANCY, URINE: Preg Test, Ur: NEGATIVE

## 2012-11-17 LAB — CBC
Hemoglobin: 14.6 g/dL (ref 12.0–15.0)
Platelets: 309 10*3/uL (ref 150–400)
RBC: 4.9 MIL/uL (ref 3.87–5.11)
RDW: 13.5 % (ref 11.5–15.5)

## 2012-11-17 LAB — ETHANOL: Alcohol, Ethyl (B): 31 mg/dL — ABNORMAL HIGH (ref 0–11)

## 2012-11-17 NOTE — ED Notes (Signed)
Pt states she is here for detox from xanax  Last used yesterday  Denies SI/HI

## 2012-11-18 ENCOUNTER — Encounter (HOSPITAL_COMMUNITY): Payer: Self-pay | Admitting: Registered Nurse

## 2012-11-18 DIAGNOSIS — F1910 Other psychoactive substance abuse, uncomplicated: Secondary | ICD-10-CM

## 2012-11-18 DIAGNOSIS — F1190 Opioid use, unspecified, uncomplicated: Secondary | ICD-10-CM | POA: Diagnosis present

## 2012-11-18 MED ORDER — NICOTINE 21 MG/24HR TD PT24: 21.0000 mg | MEDICATED_PATCH | Freq: Every day | TRANSDERMAL | Status: DC

## 2012-11-18 MED ORDER — ALUM & MAG HYDROXIDE-SIMETH 200-200-20 MG/5ML PO SUSP: 30.0000 mL | ORAL | Status: DC | PRN

## 2012-11-18 MED ORDER — LORAZEPAM 1 MG PO TABS: 1.0000 mg | ORAL_TABLET | Freq: Three times a day (TID) | ORAL | Status: DC | PRN

## 2012-11-18 MED ORDER — ZOLPIDEM TARTRATE 5 MG PO TABS
5.0000 mg | ORAL_TABLET | Freq: Every evening | ORAL | Status: DC | PRN
  Administered 2012-11-18: 5 mg via ORAL
  Filled 2012-11-18: qty 1

## 2012-11-18 MED ORDER — ONDANSETRON HCL 4 MG PO TABS: 4.0000 mg | ORAL_TABLET | Freq: Three times a day (TID) | ORAL | Status: DC | PRN

## 2012-11-18 MED ORDER — ACETAMINOPHEN 325 MG PO TABS: 650.0000 mg | ORAL_TABLET | ORAL | Status: DC | PRN

## 2012-11-18 MED ORDER — IBUPROFEN 200 MG PO TABS: 600.0000 mg | ORAL_TABLET | Freq: Three times a day (TID) | ORAL | Status: DC | PRN
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
## 2012-11-18 NOTE — Consult Note (Signed)
Note reviewed and agreed with  

## 2012-11-18 NOTE — Progress Notes (Addendum)
Per discussion with psychiatrist, patient psychiatrically stable for discharge to detox program. Pt requested [facility] or [facility] for detox, however pt not elligible for detox at those facilities as patient has insurance. Pt can follow up with these programs for treatment after detox completed. CSW spoke with [facility] who states no female treatment beds available at this time. [facility] does not accepted medicare.   Frutoso Schatz [phone]  ED CSW 11/18/2012 1006am   Per discussion with NP, patient interested in [facility]OP. CSW arranged for [facility] appointment for 11/11 at 10 for assessment with Dewayne Hatch. CSW met with pt who agreed with plan. CSW informed EDP.   Marland KitchenCatha Gosselin, [place] [phone]  ED CSW .11/18/2012 10:42am

## 2012-11-18 NOTE — BH Assessment (Signed)
Contacted [facility] to see if  atient is alert and oriented enough to complete a TA assessment. Per shift report staff attempted to see patient earlier but she was not alert or oriented. Sharia Reeve has tried to awaken patient but sts she is "still out of it". Writer will attempt to complete patient's TA at a later time.

## 2012-11-18 NOTE — ED Notes (Signed)
Patient presents requesting detox. She states that her medications are not working and she is still needing to self medicate with drugs and alcohol. Patient went through detox at [facility] on 10/16/12. She currently denies any pain; denies any suicidal or homicidal thoughts also denies any auditory or visual hallucinations.

## 2012-11-18 NOTE — ED Provider Notes (Signed)
Medical screening examination/treatment/procedure(s) were performed by non-physician practitioner and as supervising physician I was immediately available for consultation/collaboration.    Petra Dumler M Euretha Najarro, MD 11/18/12 0623 

## 2012-11-19 ENCOUNTER — Encounter (HOSPITAL_COMMUNITY): Payer: Self-pay | Admitting: Emergency Medicine

## 2012-11-19 ENCOUNTER — Emergency Department (HOSPITAL_COMMUNITY)
Admission: EM | Admit: 2012-11-19 | Discharge: 2012-11-20 | Disposition: A | Discharge status: Other Acute Inpatient Hospital | Payer: Medicare Other | Source: Home / Self Care | Attending: Emergency Medicine | Admitting: Emergency Medicine

## 2012-11-19 DIAGNOSIS — F10929 Alcohol use, unspecified with intoxication, unspecified: Secondary | ICD-10-CM

## 2012-11-19 DIAGNOSIS — F1410 Cocaine abuse, uncomplicated: Secondary | ICD-10-CM | POA: Insufficient documentation

## 2012-11-19 DIAGNOSIS — Z79899 Other long term (current) drug therapy: Secondary | ICD-10-CM | POA: Insufficient documentation

## 2012-11-19 DIAGNOSIS — Z8739 Personal history of other diseases of the musculoskeletal system and connective tissue: Secondary | ICD-10-CM | POA: Insufficient documentation

## 2012-11-19 DIAGNOSIS — F319 Bipolar disorder, unspecified: Secondary | ICD-10-CM | POA: Insufficient documentation

## 2012-11-19 DIAGNOSIS — F411 Generalized anxiety disorder: Secondary | ICD-10-CM | POA: Insufficient documentation

## 2012-11-19 DIAGNOSIS — F1210 Cannabis abuse, uncomplicated: Secondary | ICD-10-CM | POA: Insufficient documentation

## 2012-11-19 DIAGNOSIS — R45851 Suicidal ideations: Secondary | ICD-10-CM | POA: Insufficient documentation

## 2012-11-19 DIAGNOSIS — Z8619 Personal history of other infectious and parasitic diseases: Secondary | ICD-10-CM | POA: Insufficient documentation

## 2012-11-19 DIAGNOSIS — Z87440 Personal history of urinary (tract) infections: Secondary | ICD-10-CM | POA: Insufficient documentation

## 2012-11-19 DIAGNOSIS — F17200 Nicotine dependence, unspecified, uncomplicated: Secondary | ICD-10-CM | POA: Insufficient documentation

## 2012-11-19 DIAGNOSIS — F1110 Opioid abuse, uncomplicated: Secondary | ICD-10-CM | POA: Insufficient documentation

## 2012-11-19 DIAGNOSIS — Z87828 Personal history of other (healed) physical injury and trauma: Secondary | ICD-10-CM | POA: Insufficient documentation

## 2012-11-19 DIAGNOSIS — F1910 Other psychoactive substance abuse, uncomplicated: Secondary | ICD-10-CM

## 2012-11-19 DIAGNOSIS — F209 Schizophrenia, unspecified: Secondary | ICD-10-CM | POA: Insufficient documentation

## 2012-11-19 DIAGNOSIS — IMO0002 Reserved for concepts with insufficient information to code with codable children: Secondary | ICD-10-CM | POA: Insufficient documentation

## 2012-11-19 DIAGNOSIS — IMO0001 Reserved for inherently not codable concepts without codable children: Secondary | ICD-10-CM | POA: Insufficient documentation

## 2012-11-19 DIAGNOSIS — F10229 Alcohol dependence with intoxication, unspecified: Secondary | ICD-10-CM | POA: Insufficient documentation

## 2012-11-19 LAB — COMPREHENSIVE METABOLIC PANEL
ALT: 12 U/L (ref 0–35)
AST: 15 U/L (ref 0–37)
Alkaline Phosphatase: 66 U/L (ref 39–117)
BUN: 7 mg/dL (ref 6–23)
CO2: 23 mEq/L (ref 19–32)
Calcium: 9.3 mg/dL (ref 8.4–10.5)
Chloride: 107 mEq/L (ref 96–112)
Creatinine, Ser: 0.89 mg/dL (ref 0.50–1.10)
GFR calc non Af Amer: 84 mL/min — ABNORMAL LOW (ref >90)
Potassium: 3.6 mEq/L (ref 3.5–5.1)
Total Bilirubin: 0.3 mg/dL (ref 0.3–1.2)

## 2012-11-19 LAB — URINALYSIS, ROUTINE W REFLEX MICROSCOPIC
Bilirubin Urine: NEGATIVE
Glucose, UA: NEGATIVE mg/dL
Hgb urine dipstick: NEGATIVE
Ketones, ur: NEGATIVE mg/dL
Protein, ur: NEGATIVE mg/dL
Specific Gravity, Urine: 1.014 (ref 1.005–1.030)
pH: 6 (ref 5.0–8.0)

## 2012-11-19 LAB — CBC WITH DIFFERENTIAL/PLATELET
Basophils Absolute: 0 10*3/uL (ref 0.0–0.1)
Eosinophils Relative: 1 % (ref 0–5)
HCT: 38.9 % (ref 36.0–46.0)
Hemoglobin: 13.8 g/dL (ref 12.0–15.0)
Lymphocytes Relative: 38 % (ref 12–46)
Lymphs Abs: 4.2 10*3/uL — ABNORMAL HIGH (ref 0.7–4.0)
MCV: 84.4 fL (ref 78.0–100.0)
Monocytes Absolute: 0.5 10*3/uL (ref 0.1–1.0)
Monocytes Relative: 5 % (ref 3–12)
Neutro Abs: 6.2 10*3/uL (ref 1.7–7.7)
Neutrophils Relative %: 56 % (ref 43–77)
RBC: 4.61 MIL/uL (ref 3.87–5.11)
RDW: 13.9 % (ref 11.5–15.5)
WBC: 11 10*3/uL — ABNORMAL HIGH (ref 4.0–10.5)

## 2012-11-19 LAB — ACETAMINOPHEN LEVEL: Acetaminophen (Tylenol), Serum: <15 ug/mL (ref 10–30)

## 2012-11-19 MED ORDER — ONDANSETRON HCL 4 MG PO TABS: 4.0000 mg | ORAL_TABLET | Freq: Three times a day (TID) | ORAL | Status: DC | PRN

## 2012-11-19 MED ORDER — NICOTINE 21 MG/24HR TD PT24: 21.0000 mg | MEDICATED_PATCH | Freq: Every day | TRANSDERMAL | Status: DC

## 2012-11-19 MED ORDER — TRAZODONE HCL 50 MG PO TABS: 50.0000 mg | ORAL_TABLET | Freq: Every evening | ORAL | Status: DC | PRN

## 2012-11-19 MED ORDER — LORAZEPAM 1 MG PO TABS: 0.0000 mg | ORAL_TABLET | Freq: Four times a day (QID) | ORAL | Status: DC

## 2012-11-19 MED ORDER — ALUM & MAG HYDROXIDE-SIMETH 200-200-20 MG/5ML PO SUSP: 30.0000 mL | ORAL | Status: DC | PRN

## 2012-11-19 MED ORDER — ZOLPIDEM TARTRATE 5 MG PO TABS: 5.0000 mg | ORAL_TABLET | Freq: Every evening | ORAL | Status: DC | PRN

## 2012-11-19 MED ORDER — SERTRALINE HCL 50 MG PO TABS: 50.0000 mg | ORAL_TABLET | Freq: Every day | ORAL | Status: DC

## 2012-11-19 MED ORDER — RISPERIDONE 0.5 MG PO TABS
0.5000 mg | ORAL_TABLET | Freq: Two times a day (BID) | ORAL | Status: DC
  Administered 2012-11-20: 0.5 mg via ORAL
  Filled 2012-11-19: qty 1

## 2012-11-19 MED ORDER — THIAMINE HCL 100 MG/ML IJ SOLN: 100.0000 mg | Freq: Every day | INTRAMUSCULAR | Status: DC

## 2012-11-19 MED ORDER — VITAMIN B-1 100 MG PO TABS: 100.0000 mg | ORAL_TABLET | Freq: Every day | ORAL | Status: DC

## 2012-11-19 MED ORDER — GABAPENTIN 300 MG PO CAPS
300.0000 mg | ORAL_CAPSULE | Freq: Three times a day (TID) | ORAL | Status: DC
  Administered 2012-11-19: 300 mg via ORAL
  Filled 2012-11-19 (×4): qty 1

## 2012-11-19 MED ORDER — IBUPROFEN 200 MG PO TABS: 600.0000 mg | ORAL_TABLET | Freq: Three times a day (TID) | ORAL | Status: DC | PRN

## 2012-11-19 MED ORDER — HYDROXYZINE HCL 25 MG PO TABS
50.0000 mg | ORAL_TABLET | Freq: Three times a day (TID) | ORAL | Status: DC
  Administered 2012-11-20: 50 mg via ORAL
  Filled 2012-11-19: qty 2

## 2012-11-19 MED ORDER — LORAZEPAM 1 MG PO TABS: 0.0000 mg | ORAL_TABLET | Freq: Two times a day (BID) | ORAL | Status: DC
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
## 2012-11-19 NOTE — ED Notes (Signed)
Patient is alert and oriented x3.  She is stating that she "just wants to die" and that she wants detox from  Prescription pills and alcohol.  She states that he plan is to shoot herself in the head but she does not have  Gun.  Current she states that her whole spine hurts.

## 2012-11-20 ENCOUNTER — Inpatient Hospital Stay (HOSPITAL_COMMUNITY)
Admission: AD | Admit: 2012-11-20 | Discharge: 2012-11-24 | DRG: 897 | Disposition: A | Discharge status: Home / Self Care | Payer: Medicare Other | Source: Intra-hospital | Attending: Psychiatry | Admitting: Psychiatry

## 2012-11-20 ENCOUNTER — Encounter (HOSPITAL_COMMUNITY): Payer: Self-pay | Admitting: *Deleted

## 2012-11-20 ENCOUNTER — Encounter (HOSPITAL_COMMUNITY): Payer: Self-pay

## 2012-11-20 DIAGNOSIS — Z79899 Other long term (current) drug therapy: Secondary | ICD-10-CM

## 2012-11-20 DIAGNOSIS — Z720 Tobacco use: Secondary | ICD-10-CM | POA: Diagnosis present

## 2012-11-20 DIAGNOSIS — F1910 Other psychoactive substance abuse, uncomplicated: Secondary | ICD-10-CM

## 2012-11-20 DIAGNOSIS — F1190 Opioid use, unspecified, uncomplicated: Secondary | ICD-10-CM | POA: Diagnosis present

## 2012-11-20 DIAGNOSIS — F319 Bipolar disorder, unspecified: Secondary | ICD-10-CM | POA: Diagnosis present

## 2012-11-20 DIAGNOSIS — F10239 Alcohol dependence with withdrawal, unspecified: Principal | ICD-10-CM | POA: Diagnosis present

## 2012-11-20 DIAGNOSIS — F1994 Other psychoactive substance use, unspecified with psychoactive substance-induced mood disorder: Secondary | ICD-10-CM

## 2012-11-20 DIAGNOSIS — F1020 Alcohol dependence, uncomplicated: Secondary | ICD-10-CM | POA: Diagnosis present

## 2012-11-20 DIAGNOSIS — F411 Generalized anxiety disorder: Secondary | ICD-10-CM | POA: Diagnosis present

## 2012-11-20 DIAGNOSIS — F10939 Alcohol use, unspecified with withdrawal, unspecified: Principal | ICD-10-CM | POA: Diagnosis present

## 2012-11-20 DIAGNOSIS — F17200 Nicotine dependence, unspecified, uncomplicated: Secondary | ICD-10-CM | POA: Diagnosis present

## 2012-11-20 DIAGNOSIS — F3130 Bipolar disorder, current episode depressed, mild or moderate severity, unspecified: Secondary | ICD-10-CM

## 2012-11-20 DIAGNOSIS — IMO0001 Reserved for inherently not codable concepts without codable children: Secondary | ICD-10-CM | POA: Diagnosis present

## 2012-11-20 DIAGNOSIS — F1010 Alcohol abuse, uncomplicated: Secondary | ICD-10-CM | POA: Diagnosis present

## 2012-11-20 LAB — RAPID URINE DRUG SCREEN, HOSP PERFORMED
Amphetamines: NOT DETECTED
Barbiturates: NOT DETECTED
Benzodiazepines: NOT DETECTED
Opiates: NOT DETECTED

## 2012-11-20 MED ORDER — CLONIDINE HCL 0.1 MG PO TABS
0.1000 mg | ORAL_TABLET | Freq: Four times a day (QID) | ORAL | Status: AC
  Administered 2012-11-20 – 2012-11-22 (×7): 0.1 mg via ORAL
  Filled 2012-11-20 (×8): qty 1

## 2012-11-20 MED ORDER — CHLORDIAZEPOXIDE HCL 25 MG PO CAPS
25.0000 mg | ORAL_CAPSULE | Freq: Four times a day (QID) | ORAL | Status: AC | PRN
  Administered 2012-11-21 – 2012-11-22 (×2): 25 mg via ORAL
  Filled 2012-11-20 (×2): qty 1

## 2012-11-20 MED ORDER — NICOTINE 21 MG/24HR TD PT24
21.0000 mg | MEDICATED_PATCH | Freq: Every day | TRANSDERMAL | Status: DC
  Administered 2012-11-20 – 2012-11-24 (×5): 21 mg via TRANSDERMAL
  Filled 2012-11-20 (×9): qty 1

## 2012-11-20 MED ORDER — HYDROXYZINE HCL 25 MG PO TABS
25.0000 mg | ORAL_TABLET | Freq: Four times a day (QID) | ORAL | Status: DC | PRN
  Administered 2012-11-20 – 2012-11-24 (×7): 25 mg via ORAL
  Filled 2012-11-20 (×7): qty 1

## 2012-11-20 MED ORDER — METHOCARBAMOL 500 MG PO TABS
500.0000 mg | ORAL_TABLET | Freq: Three times a day (TID) | ORAL | Status: DC | PRN
  Administered 2012-11-22 – 2012-11-24 (×4): 500 mg via ORAL
  Filled 2012-11-20 (×4): qty 1

## 2012-11-20 MED ORDER — ALUM & MAG HYDROXIDE-SIMETH 200-200-20 MG/5ML PO SUSP: 30.0000 mL | ORAL | Status: DC | PRN

## 2012-11-20 MED ORDER — RISPERIDONE 1 MG PO TABS
1.0000 mg | ORAL_TABLET | Freq: Every day | ORAL | Status: DC
  Administered 2012-11-20 – 2012-11-23 (×4): 1 mg via ORAL
  Filled 2012-11-20 (×2): qty 1
  Filled 2012-11-20: qty 3
  Filled 2012-11-20 (×3): qty 1

## 2012-11-20 MED ORDER — VITAMIN B-1 100 MG PO TABS
100.0000 mg | ORAL_TABLET | Freq: Every day | ORAL | Status: DC
  Administered 2012-11-22 – 2012-11-23 (×2): 100 mg via ORAL
  Filled 2012-11-20 (×6): qty 1

## 2012-11-20 MED ORDER — CHLORDIAZEPOXIDE HCL 25 MG PO CAPS
25.0000 mg | ORAL_CAPSULE | Freq: Four times a day (QID) | ORAL | Status: AC
  Administered 2012-11-20 (×2): 25 mg via ORAL
  Filled 2012-11-20 (×2): qty 1

## 2012-11-20 MED ORDER — DICYCLOMINE HCL 20 MG PO TABS: 20.0000 mg | ORAL_TABLET | Freq: Four times a day (QID) | ORAL | Status: DC | PRN

## 2012-11-20 MED ORDER — SERTRALINE HCL 50 MG PO TABS
50.0000 mg | ORAL_TABLET | Freq: Every day | ORAL | Status: DC
  Administered 2012-11-20 – 2012-11-24 (×5): 50 mg via ORAL
  Filled 2012-11-20 (×7): qty 1
  Filled 2012-11-20: qty 3

## 2012-11-20 MED ORDER — CHLORDIAZEPOXIDE HCL 25 MG PO CAPS: 25.0000 mg | ORAL_CAPSULE | ORAL | Status: DC

## 2012-11-20 MED ORDER — CHLORDIAZEPOXIDE HCL 25 MG PO CAPS
50.0000 mg | ORAL_CAPSULE | Freq: Once | ORAL | Status: AC
  Administered 2012-11-20: 50 mg via ORAL
  Filled 2012-11-20: qty 2

## 2012-11-20 MED ORDER — MAGNESIUM HYDROXIDE 400 MG/5ML PO SUSP: 30.0000 mL | Freq: Every day | ORAL | Status: DC | PRN

## 2012-11-20 MED ORDER — CHLORDIAZEPOXIDE HCL 25 MG PO CAPS
25.0000 mg | ORAL_CAPSULE | Freq: Three times a day (TID) | ORAL | Status: DC
  Administered 2012-11-21 (×2): 25 mg via ORAL
  Filled 2012-11-20 (×2): qty 1

## 2012-11-20 MED ORDER — CLONIDINE HCL 0.1 MG PO TABS
0.1000 mg | ORAL_TABLET | Freq: Every day | ORAL | Status: DC
  Administered 2012-11-24: 0.1 mg via ORAL
  Filled 2012-11-20 (×2): qty 1

## 2012-11-20 MED ORDER — NAPROXEN 500 MG PO TABS
500.0000 mg | ORAL_TABLET | Freq: Two times a day (BID) | ORAL | Status: DC | PRN
  Administered 2012-11-23 – 2012-11-24 (×3): 500 mg via ORAL
  Filled 2012-11-20 (×3): qty 1

## 2012-11-20 MED ORDER — CHLORDIAZEPOXIDE HCL 25 MG PO CAPS
25.0000 mg | ORAL_CAPSULE | Freq: Every day | ORAL | Status: AC
  Administered 2012-11-21 – 2012-11-23 (×2): 25 mg via ORAL

## 2012-11-20 MED ORDER — CLONIDINE HCL 0.1 MG PO TABS
0.1000 mg | ORAL_TABLET | ORAL | Status: AC
  Administered 2012-11-22 – 2012-11-23 (×3): 0.1 mg via ORAL
  Filled 2012-11-20 (×4): qty 1

## 2012-11-20 MED ORDER — THIAMINE HCL 100 MG/ML IJ SOLN
100.0000 mg | Freq: Once | INTRAMUSCULAR | Status: AC
  Administered 2012-11-20: 100 mg via INTRAMUSCULAR
  Filled 2012-11-20: qty 2

## 2012-11-20 MED ORDER — ACETAMINOPHEN 325 MG PO TABS
650.0000 mg | ORAL_TABLET | Freq: Four times a day (QID) | ORAL | Status: DC | PRN
  Administered 2012-11-22 – 2012-11-23 (×2): 650 mg via ORAL
  Filled 2012-11-20 (×2): qty 2

## 2012-11-20 MED ORDER — GABAPENTIN 300 MG PO CAPS
300.0000 mg | ORAL_CAPSULE | Freq: Three times a day (TID) | ORAL | Status: DC
  Administered 2012-11-20 – 2012-11-24 (×13): 300 mg via ORAL
  Filled 2012-11-20 (×5): qty 1
  Filled 2012-11-20: qty 9
  Filled 2012-11-20 (×4): qty 1
  Filled 2012-11-20: qty 9
  Filled 2012-11-20 (×7): qty 1
  Filled 2012-11-20: qty 9

## 2012-11-20 MED ORDER — TRAZODONE HCL 50 MG PO TABS
50.0000 mg | ORAL_TABLET | Freq: Every evening | ORAL | Status: DC | PRN
  Administered 2012-11-20 – 2012-11-23 (×5): 50 mg via ORAL
  Filled 2012-11-20 (×5): qty 1
  Filled 2012-11-20: qty 6
  Filled 2012-11-20 (×3): qty 1
  Filled 2012-11-20: qty 6
  Filled 2012-11-20 (×3): qty 1

## 2012-11-20 MED ORDER — ADULT MULTIVITAMIN W/MINERALS CH
1.0000 | ORAL_TABLET | Freq: Every day | ORAL | Status: DC
  Administered 2012-11-22: 1 via ORAL
  Filled 2012-11-20 (×7): qty 1

## 2012-11-20 NOTE — [facility] Group Notes (Signed)
[facility] LCSW Group Therapy  11/20/2012  1:15 PM   Type of Therapy:  Group Therapy  Participation Level:  Active  Participation Quality:  Appropriate and Attentive  Affect:  Appropriate  Cognitive:  Alert and Appropriate  Insight:  Developing/Improving and Engaged  Engagement in Therapy:  Developing/Improving and Engaged  Modes of Intervention:  Clarification, Confrontation, Discussion, Education, Exploration, Limit-setting, Orientation, Problem-solving, Rapport Building, Dance movement psychotherapist, Socialization and Support  Summary of Progress/Problems: The topic for today was feelings about relapse.  Pt discussed what relapse prevention is to them and identified triggers that they are on the path to relapse.  Pt processed their feeling towards relapse and was able to relate to peers.  Pt discussed coping skills that can be used for relapse prevention.  Pt was able to process what happened when she left here a month ago.  Pt discussed leaving with another patient, going home and getting drunk and high immediately, and later getting arrested and sitting in jail overnight.  Pt states that she feels safe here and is glad to be back.  Pt appeared to make light of what happened when she left the hospital, before being redirected to discuss her feelings and motivation for getting clean and sober.  Pt states that she doesn't want to go back to jail and wants to be a good mother.  PT actively participated and was engaged in group discussion.    Reyes Ivan, LCSW 11/20/2012 2:29 PM

## 2012-11-20 NOTE — ED Notes (Signed)
This is a 32 years old female admitted for Social Anxiety and Opiate dependence. Patient reported that she drinks everyday until she passed out. She also endorsed benzo, THC ,cocaine and heroine abuse. She appeared tearful during this assessmet. Patient stated that she is depressed and tired of living that way. She is requesting for detox. Her mood and affect sad and depressed. She requested for snacks and some beverage to drink. Writer oriented patient to the unit and Q 15 minute check initiated.

## 2012-11-20 NOTE — [facility] Group Notes (Signed)
[facility] LCSW Aftercare Discharge Planning Group Note   11/20/2012  8:45 AM  Participation Quality:  Did Not Attend  Reyes Ivan, LCSW 11/20/2012 9:46 AM
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
## 2012-11-20 NOTE — [facility] Counselor (Signed)
Adult Psychosocial Assessment Update Interdisciplinary Team  [facility] admissions/discharges:  Admissions Discharges  Date: 10/16/12 Date: 10/22/12  Date: 09/05/11 Date: 09/09/11  Date: Date:  Date: Date:  Date: Date:   Changes since the last Psychosocial Assessment (including adherence to outpatient mental health and/or substance abuse treatment, situational issues contributing to decompensation and/or relapse). Pt states that she returned home after last discharge and was arrested for disorderly conduct.  Pt states that she went to [facility] and stayed there for 2 weeks but reports being kicked out for drinking; pt denies drinking and thinks they made it up to get her out of the program.  Pt denies any further follow up outpatient, stating she hasn't ran out of her samples since last admission.               Discharge Plan 1. Will you be returning to the same living situation after discharge?   Yes: X Pt can return to own home but is hopeful that she can go straight to treatment from here No:      If no, what is your plan?           2. Would you like a referral for services when you are discharged? Yes:  X    If yes, for what services? Referral to inpatient treatment  No:              Summary and Recommendations (to be completed by the evaluator) Patient is a 32 year old female with a diagnosis of Substance Use.  Patient lives in [place] alone.  Patient will benefit from crisis stabilization, medication evaluation, group therapy and psycho education in addition to case management for discharge planning.                         Signature:  Carmina Miller, 11/20/2012 8:19 AM

## 2012-11-20 NOTE — [facility] Suicide Risk Assessment (Signed)
Suicide Risk Assessment  Admission Assessment     Nursing information obtained from:  Patient Demographic factors:  Caucasian;Unemployed;Living alone Current Mental Status:  NA Loss Factors:  Loss of significant relationship;Financial problems / change in socioeconomic status Historical Factors:  Prior suicide attempts Risk Reduction Factors:  Responsible for children under 34 years of age;Positive social support  CLINICAL FACTORS:   Alcohol/Substance Abuse/Dependencies  COGNITIVE FEATURES THAT CONTRIBUTE TO RISK:  Closed-mindedness Polarized thinking Thought constriction (tunnel vision)    SUICIDE RISK:   Moderate:  Frequent suicidal ideation with limited intensity, and duration, some specificity in terms of plans, no associated intent, good self-control, limited dysphoria/symptomatology, some risk factors present, and identifiable protective factors, including available and accessible social support.  PLAN OF CARE: Supportive approach/coping skills/relapse prevention                               Detox LIbrium/clonidine                               Reassess and address the co morbidities  I certify that inpatient services furnished can reasonably be expected to improve the patient's condition.  Alannah Averhart A 11/20/2012, 3:09 PM

## 2012-11-20 NOTE — Tx Team (Signed)
Interdisciplinary Treatment Plan Update (Adult)  Date: 11/20/2012  Time Reviewed:  9:45 AM  Progress in Treatment: Attending groups: Yes Participating in groups:  Yes Taking medication as prescribed:  Yes Tolerating medication:  Yes Family/Significant othe contact made: CSW assessing Patient understands diagnosis:  Yes Discussing patient identified problems/goals with staff:  Yes Medical problems stabilized or resolved:  Yes Denies suicidal/homicidal ideation: Yes Issues/concerns per patient self-inventory:  Yes Other:  New problem(s) identified: N/A  Discharge Plan or Barriers: CSW assessing for appropriate referrals.    Reason for Continuation of Hospitalization: Anxiety Depression Medication Stabilization  Comments: N/A  Estimated length of stay: 3-5 days  For review of initial/current patient goals, please see plan of care.  Attendees: Patient:     Family:     Physician:  Dr. Dub Mikes 11/20/2012 10:06 AM   Nursing:   Lamount Cranker, RN 11/20/2012 10:06 AM   Clinical Social Worker:  Reyes Ivan, LCSW 11/20/2012 10:06 AM   Other: Onnie Boer, RN case manager 11/20/2012 10:06 AM   Other:   Other:   Other:     Other:    Other:    Other:    Other:    Other:    Other:     Scribe for Treatment Team:   Carmina Miller, 11/20/2012 , 10:06 AM

## 2012-11-20 NOTE — [facility] Counselor (Signed)
This Clinical research associate called Dr. Nicanor Alcon for clinicals, unable to provide because she had not seen the pt.

## 2012-11-20 NOTE — [facility] Group Notes (Signed)
Adult Psychoeducational Group Note  Date:  11/20/2012 Time:  10:32 PM  Group Topic/Focus:  AA Meeting  Participation Level:  Active  Participation Quality:  Appropriate  Affect:  Appropriate  Cognitive:  Appropriate  Insight: Appropriate  Engagement in Group:  Engaged  Modes of Intervention:  Discussion and Education  Additional Comments:  Aryiana attended AA group.  Caroll Rancher A 11/20/2012, 10:32 PM

## 2012-11-20 NOTE — Progress Notes (Signed)
Patient ID: [name], female   DOB: 05-Nov-1980, 32 y.o.   MRN: [id number] Pt did not participate, she was asleep in her bed.

## 2012-11-20 NOTE — Progress Notes (Signed)
Patient observed in bed. Patient stated that she was very tired. Patient was drowsy. Denies SI, HI, AVH. Contracts for safety. Patient napped before evening group. Patient attended evening group.   Patient received ordered medications. Resting comfortably on the unit.  Safety maintained on the unit. Q 15 minute checks continue.
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
## 2012-11-20 NOTE — Tx Team (Signed)
Initial Interdisciplinary Treatment Plan  PATIENT STRENGTHS: (choose at least two) Ability for insight Average or above average intelligence Capable of independent living Motivation for treatment/growth Supportive family/friends  PATIENT STRESSORS: Financial difficulties Medication change or noncompliance Substance abuse   PROBLEM LIST: Problem List/Patient Goals Date to be addressed Date deferred Reason deferred Estimated date of resolution  depresion 11/20/2012     Substance dependence 11/20/2012                                                DISCHARGE CRITERIA:  Ability to meet basic life and health needs Medical problems require only outpatient monitoring Motivation to continue treatment in a less acute level of care Need for constant or close observation no longer present Verbal commitment to aftercare and medication compliance Withdrawal symptoms are absent or subacute and managed without 24-hour nursing intervention  PRELIMINARY DISCHARGE PLAN: Attend aftercare/continuing care group Attend PHP/IOP Attend 12-step recovery group Return to previous living arrangement  PATIENT/FAMIILY INVOLVEMENT: This treatment plan has been presented to and reviewed with the patient, [name], and/or family member,  The patient and family have been given the opportunity to ask questions and make suggestions.  JEHU-APPIAH, Christianna Belmonte K 11/20/2012, 6:17 AM

## 2012-11-20 NOTE — Progress Notes (Signed)
Patient ID: [name], female   DOB: Sep 21, 1980, 32 y.o.   MRN: [id number] Admission note: D:Patient is a  Voluntary admission in no acute distress for depression and polysubstance abuse. Pt stated "she uses everything to self medicate for his medications not working". Pt reports after discharge from [facility] a month ago, she followed up with [facility] but was kicked out for drinking. Pt denies drinking. Pt reports she was not allowed back for a month so she started to self medication to help with the withdrawal and depression. Pt reports having a two year old son and wants to stop drinking to be there for him. Pt reports hx of blackouts denies seizures. Pt denies SI/HI/AVH. Pt was calm and cooperative with admission.  A: Pt admitted to unit per protocol, skin assessment and belonging search done. No skin issues noted. Consent signed by pt. Pt educated on therapeutic milieu rules. Pt was introduced to milieu by nursing staff. Fall risk safety plan explained to the patient. 15 minutes checks started for safety.  R: Pt was receptive to education. Writer offered support.
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
## 2012-11-20 NOTE — [facility] Suicide Risk Assessment (Signed)
[facility] INPATIENT:  Family/Significant Other Suicide Prevention Education  Suicide Prevention Education:  Education Completed; Ellarae Nevitt - mother [phone]),  (name of family member/significant other) has been identified by the patient as the family member/significant other with whom the patient will be residing, and identified as the person(s) who will aid the patient in the event of a mental health crisis (suicidal ideations/suicide attempt).  With written consent from the patient, the family member/significant other has been provided the following suicide prevention education, prior to the and/or following the discharge of the patient.  The suicide prevention education provided includes the following:  Suicide risk factors  Suicide prevention and interventions  National Suicide Hotline telephone number  [facility] assessment telephone number  [facility] Emergency Assistance 911  [facility] and/or Residential Mobile Crisis Unit telephone number  Request made of family/significant other to:  Remove weapons (e.g., guns, rifles, knives), all items previously/currently identified as safety concern.    Remove drugs/medications (over-the-counter, prescriptions, illicit drugs), all items previously/currently identified as a safety concern.  The family member/significant other verbalizes understanding of the suicide prevention education information provided.  The family member/significant other agrees to remove the items of safety concern listed above.  Carmina Miller 11/20/2012, 11:18 AM

## 2012-11-21 MED ORDER — CHLORPROMAZINE HCL 10 MG PO TABS: 10.0000 mg | ORAL_TABLET | Freq: Three times a day (TID) | ORAL | Status: DC

## 2012-11-21 NOTE — Progress Notes (Signed)
D- Patient is out in milieu interacting with peers and attending groups.  She has stated that she no longer wants her detox medications, desiring only the meds that "she will be going home on".  Rates depression at 5 and hopelessness at 3.  Denies SI.  A- Encoraged to inform provider of requested medications.  Support offered.  Continue current POC and evaluation of treatment goals.  Continue 15' checks for safety.

## 2012-11-21 NOTE — [facility] Group Notes (Signed)
[facility] Group Notes:  (Nursing/MHT/Case Management/Adjunct)  Date:  11/21/2012  Time:  12:27 PM  Type of Therapy:  Psychoeducational Skills  Participation Level:  Did Not Attend   Buford Dresser 11/21/2012, 12:27 PM

## 2012-11-21 NOTE — [facility] Group Notes (Signed)
[facility] Group Notes:  (Clinical Social Work)  11/21/2012     1-2pm  Summary of Progress/Problems:   The main focus of today's process group was for the patient to identify ways in which they have in the past sabotaged their own recovery. Motivational Interviewing was utilized to ask the group members what they get out of their substance use, and what reasons they may have for wanting to change.  The Stages of Change were explained using a handout, and patients identified where they currently are with regard to stages of change.  The patient expressed that she has "unbelievable" anxiety and she uses every time she leaves the house in order to deal with the anxiety.  She then uses a different substance to handle the side effects of the earlier drug, and on and on.  She was slurring her words during group, was angry that she is not being prescribed what she wants and that her current med is making her so sleepy.  Is unwilling to see if that side effect subsides.  Type of Therapy:  Group Therapy - Process   Participation Level:  Active  Participation Quality:  Drowsy, Inattentive and Resistant  Affect:  Flat and Irritable  Cognitive:  Oriented  Insight:  Limited  Engagement in Therapy:  Improving  Modes of Intervention:  Education, Support and Processing, Motivational Interviewing  Ambrose Mantle, LCSW 11/21/2012, 12:17 PM

## 2012-11-21 NOTE — Progress Notes (Signed)
Spoke with pt 1:1. Pt had indicated on dayshift that she no longer wanted any detox meds - "I don't want to take anything that I won't be discharged on" - however now states she is quite anxious and irritable. Requesting sched hs meds but also librium and vistaril. Medicated without difficulty. Provided med education, support and encouragement. Pt very concerned that no med changes have been made. "I'm still taking the same things I was taking when I came. Those meds don't control my anxiety which is why I use. I use to mellow out." Encouraged pt to speak with MD/NP. Pt receptive. She denies SI/HI/AVH and remains safe. Lawrence Marseilles

## 2012-11-21 NOTE — ED Provider Notes (Signed)
Medical screening examination/treatment/procedure(s) were performed by non-physician practitioner and as supervising physician I was immediately available for consultation/collaboration.  Isidoro Santillana L Skyelar Swigart, MD 11/21/12 0705 
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
## 2012-11-22 NOTE — [facility] Group Notes (Signed)
[facility] Group Notes:  (Clinical Social Work)  11/22/2012  10:00-11:00AM  Summary of Progress/Problems:   The main focus of today's process group was to   identify the patient's current support system and decide on other supports that can be put in place.  The picture on workbook was used to discuss why additional supports are needed, and a hand-out was distributed with four definitions/levels of support, then used to talk about how patients have given and received all different kinds of support.  An emphasis was placed on using counselor, doctor, therapy groups, 12-step groups, and problem-specific support groups to expand supports.  The patient identified one additional support as being herself, talked about needing to stand up for herself with her mother, telling her mother she will not listen to her negativity any longer.  For instance, her other tells her she is disgusting for being bisexual.  This may include not asking her mother for rides home from the hospital.  Her mother makes her want to use, and the group talked to patient at length about the harm that keeps resulting from her allowing her mother control in her life.  She talked about getting sober to prove to her mother that she can -- CSW encouraged her to pursue sobriety to prove to herself that she can.  Type of Therapy:  Process Group with Motivational Interviewing  Participation Level:  Active  Participation Quality:  Attentive and Sharing  Affect:  Blunted  Cognitive:  Appropriate and Oriented  Insight:  Engaged  Engagement in Therapy:  Engaged  Modes of Intervention:   Education, Support and Processing, Activity  [employer], LCSW 11/22/2012, 12:15pm
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
## 2012-11-22 NOTE — Progress Notes (Signed)
[facility] Group Notes:  (Nursing/MHT/Case Management/Adjunct)  Date:  11/21/2012 Time:  8:00p.m.   Type of Therapy:  Psychoeducational Skills  Participation Level:  Minimal  Participation Quality:  Attentive  Affect:  Blunted  Cognitive:  Lacking  Insight:  Lacking  Engagement in Group:  Developing/Improving  Modes of Intervention:  Education  Summary of Progress/Problems: The patient verbalized in group that she had a good day due in part to receiving quite a bit of rest. As a theme for the day, she shared that her relapse prevention includes erasing the bad phone numbers from her cell phone, linking up with a sponsor, and going to [facility] following discharge.   Lorraine Terriquez S 11/22/2012, 12:50 AM

## 2012-11-22 NOTE — [facility] Group Notes (Signed)
[facility] Group Notes:  (Nursing/MHT/Case Management/Adjunct)  Date:  11/22/2012  Time:  2:19 PM  Type of Therapy:  Nurse Education  Participation Level:  Active  Participation Quality:  Intrusive and Redirectable  Affect:  Excited  Cognitive:  Alert and Appropriate  Insight:  Improving  Engagement in Group:  Distracting  Modes of Intervention:  Education  Summary of Progress/Problems: Side bar conversations with a female peer...neede frequent redirection [name] 11/22/2012, 2:19 PM

## 2012-11-22 NOTE — Progress Notes (Signed)
D-Crissy c/o anxiety this am but was open to holding off on prn medications.  She is out in the milieu attending groups and interacting with peers.  She rates  depression at 5 and hopelessness at 2.  Denies SI. A- Support and positive reenforcement given.  Continue current POC and evaluation of treatment goals.  Continue 15' checks for safety.  R-Safety maintained.

## 2012-11-23 DIAGNOSIS — F1020 Alcohol dependence, uncomplicated: Secondary | ICD-10-CM

## 2012-11-23 DIAGNOSIS — F39 Unspecified mood [affective] disorder: Secondary | ICD-10-CM

## 2012-11-23 NOTE — [facility] Group Notes (Signed)
[facility] LCSW Group Therapy  11/23/2012 3:33 PM  Type of Therapy:  Group Therapy  Participation Level:  Active  Participation Quality:  Inattentive  Affect:  Appropriate  Cognitive:  Lacking  Insight:  Distracting  Engagement in Therapy:  Distracting  Modes of Intervention:  Confrontation, Discussion, Education, Exploration, Socialization and Support  Summary of Progress/Problems: Today's Topic: Overcoming Obstacles. Pt identified obstacles faced currently and processed barriers involved in overcoming these obstacles. Pt identified steps necessary for overcoming these obstacles and explored motivation (internal and external) for facing these difficulties head on. Pt further identified one area of concern in their lives and chose a skill of focus pulled from their "toolbox." Leylah was inattentive throughout the majority of today's therapy group. Gilma stated that her biggest obstacle involves "going back to where I live. I am surrounded by bars and clubs." Latoiya and another group member explored ways to deal with returning to these tempting living environments. Cydne stated that she plans to go to [facility] and learn some more skills in order to "make me stronger and more able to fight temptation." Aaralyn shows limited progress in the group setting AEB her inability to remain attentive and her constant need for redirection by CSW. Pt was asked several times to stop side conversations.    Smart, Jasreet Dickie 11/23/2012, 3:33 PM

## 2012-11-23 NOTE — [facility] Group Notes (Signed)
[facility] LCSW Aftercare Discharge Planning Group Note   11/23/2012 11:25 AM  Participation Quality:  Appropriate   Mood/Affect:  Appropriate  Depression Rating:  2  Anxiety Rating:  5  Thoughts of Suicide:  No Will you contract for safety?   NA  Current AVH:  No  Plan for Discharge/Comments:  Pt reports that she would like referral to [facility] and plans to return home/follow up at [facility] for med management until bed available at [facility].  Transportation Means: walk home/bus  Supports: Wellsite geologist, Research scientist (physical sciences)

## 2012-11-23 NOTE — Progress Notes (Signed)
Patient ID: [name], female   DOB: 1980-06-10, 32 y.o.   MRN: [id number] PER STATE REGULATIONS 482.30  THIS CHART WAS REVIEWED FOR MEDICAL NECESSITY WITH RESPECT TO THE PATIENT'S ADMISSION/ DURATION OF STAY.  NEXT REVIEW DATE: 11/24/2012  Willa Rough, RN, BSN CASE MANAGER

## 2012-11-23 NOTE — Progress Notes (Signed)
The focus of this group is to help patients review their daily goal of treatment and discuss progress on daily workbooks. Pt attended the evening group session and responded to discussion prompts from the Writer. Pt reported having had a good day on the unit, the highlight of which was looking forward to her pending discharge, which Pt said she was ready for. Pt reported having no additional needs from Nursing Staff this evening. She did, however, require multiple redirections from holding side conversations while her peers spoke. Pt's affect was otherwise appropriate.

## 2012-11-23 NOTE — Progress Notes (Signed)
Patient did attend the evening speaker AA meeting.  

## 2012-11-23 NOTE — Progress Notes (Signed)
[facility] MD Progress Note  11/23/2012 12:33 PM [name]  MRN:  [id number] Subjective:  Sill endorses anxiety, worry. She wants to go to [facility] from here. States she knows she needs the help. She left [facility] impulsively, now feels she should have staid. Without more treatment she is not sure is she is going to be able to make it. Still with anxiety, worry, trying not to let the anxiety take over. Trying to deal with in a healthier way than reaching for alcohol or drugs.  Diagnosis:   DSM5: Schizophrenia Disorders:  None Obsessive-Compulsive Disorders:  None Trauma-Stressor Disorders:  None Substance/Addictive Disorders:  Alcohol Related Disorder - Severe (303.90) Depressive Disorders:  Major Depressive Disorder - Moderate (296.22)  Axis I: Anxiety Disorder NOS and Mood Disorder NOS  ADL's:  Intact  Sleep: Fair  Appetite:  Fair  Suicidal Ideation:  Plan:  denies Intent:  denies Means:  denies Homicidal Ideation:  Plan:  denies Intent:  denies Means:  denies AEB (as evidenced by):  Psychiatric Specialty Exam: Review of Systems  Constitutional: Negative.   HENT: Negative.   Eyes: Negative.   Respiratory: Negative.   Cardiovascular: Negative.   Gastrointestinal: Negative.   Genitourinary: Negative.   Musculoskeletal: Negative.   Skin: Negative.   Neurological: Negative.   Endo/Heme/Allergies: Negative.   Psychiatric/Behavioral: Positive for depression and substance abuse. The patient is nervous/anxious.     Blood pressure 104/68, pulse 106, temperature 97.5 F (36.4 C), temperature source Oral, resp. rate 16, height 5\' 6"  (1.676 m), weight 69.854 kg (154 lb), last menstrual period 11/01/2012, not currently breastfeeding.Body mass index is 24.87 kg/(m^2).  General Appearance: Fairly Groomed  Patent attorney::  Fair  Speech:  Clear and Coherent and rapid  Volume:  fluctuates  Mood:  Anxious and worried  Affect:  anxious  Thought Process:  Coherent and Goal Directed   Orientation:  Full (Time, Place, and Person)  Thought Content:  worries, concerns  Suicidal Thoughts:  No  Homicidal Thoughts:  No  Memory:  Immediate;   Fair Recent;   Fair Remote;   Fair  Judgement:  Fair  Insight:  Present, superficial  Psychomotor Activity:  Restlessness  Concentration:  Fair  Recall:  Fair  Akathisia:  No  Handed:    AIMS (if indicated):     Assets:  Desire for Improvement  Sleep:  Number of Hours: 6   Current Medications: Current Facility-Administered Medications  Medication Dose Route Frequency Provider Last Rate Last Dose  . acetaminophen (TYLENOL) tablet 650 mg  650 mg Oral Q6H PRN Nanine Means, NP   650 mg at 11/22/12 1725  . alum & mag hydroxide-simeth (MAALOX/MYLANTA) 200-200-20 MG/5ML suspension 30 mL  30 mL Oral Q4H PRN Nanine Means, NP      . cloNIDine (CATAPRES) tablet 0.1 mg  0.1 mg Oral BH-qamhs Nanine Means, NP   0.1 mg at 11/23/12 0844   Followed by  . [START ON 11/24/2012] cloNIDine (CATAPRES) tablet 0.1 mg  0.1 mg Oral QAC breakfast Nanine Means, NP      . dicyclomine (BENTYL) tablet 20 mg  20 mg Oral Q6H PRN Nanine Means, NP      . gabapentin (NEURONTIN) capsule 300 mg  300 mg Oral TID Nanine Means, NP   300 mg at 11/23/12 0844  . hydrOXYzine (ATARAX/VISTARIL) tablet 25 mg  25 mg Oral Q6H PRN Nanine Means, NP   25 mg at 11/23/12 1016  . magnesium hydroxide (MILK OF MAGNESIA) suspension 30 mL  30 mL  Oral Daily PRN Nanine Means, NP      . methocarbamol (ROBAXIN) tablet 500 mg  500 mg Oral Q8H PRN Nanine Means, NP   500 mg at 11/23/12 1017  . multivitamin with minerals tablet 1 tablet  1 tablet Oral Daily Nanine Means, NP   1 tablet at 11/22/12 0752  . naproxen (NAPROSYN) tablet 500 mg  500 mg Oral BID PRN Nanine Means, NP   500 mg at 11/23/12 1017  . nicotine (NICODERM CQ - dosed in mg/24 hours) patch 21 mg  21 mg Transdermal Daily Rachael Fee, MD   21 mg at 11/23/12 0844  . risperiDONE (RISPERDAL) tablet 1 mg  1 mg Oral QHS Nanine Means, NP    1 mg at 11/22/12 2145  . sertraline (ZOLOFT) tablet 50 mg  50 mg Oral Daily Nanine Means, NP   50 mg at 11/23/12 0844  . thiamine (VITAMIN B-1) tablet 100 mg  100 mg Oral Daily Nanine Means, NP   100 mg at 11/23/12 0844  . traZODone (DESYREL) tablet 50 mg  50 mg Oral QHS,MR X 1 Rachael Fee, MD   50 mg at 11/22/12 2244    Lab Results: No results found for this or any previous visit (from the past 48 hour(s)).  Physical Findings: AIMS: Facial and Oral Movements Muscles of Facial Expression: None, normal Lips and Perioral Area: None, normal Jaw: None, normal Tongue: None, normal,Extremity Movements Upper (arms, wrists, hands, fingers): None, normal Lower (legs, knees, ankles, toes): None, normal, Trunk Movements Neck, shoulders, hips: None, normal, Overall Severity Severity of abnormal movements (highest score from questions above): None, normal Incapacitation due to abnormal movements: None, normal Patient's awareness of abnormal movements (rate only patient's report): No Awareness, Dental Status Current problems with teeth and/or dentures?: No Does patient usually wear dentures?: No  CIWA:  CIWA-Ar Total: 11 COWS:  COWS Total Score: 10  Treatment Plan Summary: Daily contact with patient to assess and evaluate symptoms and progress in treatment Medication management  Plan: Supportive approach/coping skills/relapse prevention           Optimize treatment with psychotropics            Complete the detox           CBT;mindfulness  Medical Decision Making Problem Points:  Review of psycho-social stressors (1) Data Points:  Review of medication regiment & side effects (2)  I certify that inpatient services furnished can reasonably be expected to improve the patient's condition.   Adith Tejada A 11/23/2012, 12:33 PM

## 2012-11-23 NOTE — Progress Notes (Signed)
D:  Patient has been up and active in the milieu today.  Has been attending some groups.  Was seen playing a game earlier during group time.  When the group was informed they could not play the game at that time, she got up and went into group.  She did complain of pain earlier today and was given Naprosyn and Robaxin with relief.  She has been less irritable today.  Denies suicidal thoughts.  A:  Medications given as prescribed.  Encouraged participation in groups.  Educated on current medications and when to take them.  Offered support and encouragement.  R:  Cooperative with staff.  Interacting well with peers.  Is less irritable today.  Progressing well through detox.  Safety is maintained.

## 2012-11-23 NOTE — Progress Notes (Signed)
Adult Psychoeducational Group Note  Date:  11/23/2012 Time:  6:43 PM  Group Topic/Focus:  Self Care:   The focus of this group is to help patients understand the importance of self-care in order to improve or restore emotional, physical, spiritual, interpersonal, and financial health.  Participation Level:  Minimal  Participation Quality:  Inattentive, Redirectable and Resistant  Affect:  Blunted and Defensive  Cognitive:  Lacking  Insight: Lacking  Engagement in Group:  Lacking and Limited  Modes of Intervention:  Activity, Discussion, Education, Limit-setting, Socialization and Support  Additional Comments:  Kristyl attended group and was resistance to sharing during group. Patient came into group late, and did complete the self care assessment and shared minimal during group   Karleen Hampshire Brittini 11/23/2012, 6:43 PM

## 2012-11-23 NOTE — Progress Notes (Addendum)
D: Pt denies SI/HI/AVH. Pt states her pain is 10 out of 10 and she "need everything I can have". Pt stated she needed everything, but writer explained that she could get medication for the symptoms she was having. Pt stated she wanted naproxen and writer asked her what she needed it for and pt replied " alcohol withdrawals.  Writer informed her what type of symptoms she was having, and pt replied anxiety and she was informed that she was getting something for anxiety. Pt argued with Clinical research associate for about 5 minutes stating she needed medication, but proceeded to describe symptoms for things she was already getting. Pt was exhibiting drug seeking behaviors, pt was at window very tearful and stating her anxiety was so high that she was about to burst, but pt was observed by writer playing cards, laughing and joking with other patients 3 minutes after pt was informed that she could not get anything else for anxiety at that time.      A: Pt was offered support and encouragement. Pt was given scheduled medications. Pt was encourage to attend groups. Q 15 minute checks were done for safety.   R:Pt attends groups and interacts well with peers and staff. Pt is taking medication.Pt  safety maintained on unit.

## 2012-11-24 DIAGNOSIS — F1120 Opioid dependence, uncomplicated: Secondary | ICD-10-CM

## 2012-11-24 DIAGNOSIS — F10239 Alcohol dependence with withdrawal, unspecified: Principal | ICD-10-CM

## 2012-11-24 DIAGNOSIS — F411 Generalized anxiety disorder: Secondary | ICD-10-CM

## 2012-11-24 MED ORDER — TRAZODONE HCL 50 MG PO TABS: 50.0000 mg | ORAL_TABLET | Freq: Every evening | ORAL | Status: DC | PRN

## 2012-11-24 MED ORDER — GABAPENTIN 300 MG PO CAPS: 300.0000 mg | ORAL_CAPSULE | Freq: Three times a day (TID) | ORAL | Status: DC

## 2012-11-24 MED ORDER — RISPERIDONE 1 MG PO TABS: 1.0000 mg | ORAL_TABLET | Freq: Every day | ORAL | Status: DC

## 2012-11-24 MED ORDER — SERTRALINE HCL 50 MG PO TABS: 50.0000 mg | ORAL_TABLET | Freq: Every day | ORAL | Status: DC

## 2012-11-24 MED ORDER — HYDROXYZINE HCL 25 MG PO TABS
25.0000 mg | ORAL_TABLET | Freq: Three times a day (TID) | ORAL | Status: DC | PRN
  Filled 2012-11-24: qty 9

## 2012-11-24 NOTE — Progress Notes (Signed)
Pt frequently requesting prns for anxiety, but is witnessed by staff socializing with female patients, being flirtatious.  Pt tends to be irritable when she is not able to get meds when she wants.  Pt says she wants to go to long term rehab, and is hoping to go to [facility].  Pt did attend evening wrap up group tonight.  Pt makes her needs known to staff.  Support and encouragement offered.  Safety maintained with q15 minute checks.

## 2012-11-24 NOTE — Tx Team (Signed)
Interdisciplinary Treatment Plan Update (Adult)  Date: 11/24/2012  Time Reviewed:  9:45 AM  Progress in Treatment: Attending groups: Yes Participating in groups:  Yes Taking medication as prescribed:  Yes Tolerating medication:  Yes Family/Significant othe contact made: Contact made with pt's mother.  Patient understands diagnosis:  Yes Discussing patient identified problems/goals with staff:  Yes Medical problems stabilized or resolved:  Yes Denies suicidal/homicidal ideation: Yes Issues/concerns per patient self-inventory:  Yes Other:  New problem(s) identified: N/A  Discharge Plan or Barriers: Pt referral made to [facility]. No bed available today. Pt to d/c and return home/follow up at [facility] and will call [facility] daily to check for bed availability.   Reason for Continuation of Hospitalization: D/c today  Comments: N/A  Estimated length of stay: d/c today For review of initial/current patient goals, please see plan of care.  Attendees: Patient:  [name] 11/24/2012 9:24 AM   Family:     Physician:  Dr. Dub Mikes 11/24/2012 9:24 AM   Nursing:   11/24/2012 9:24 AM   Clinical Social Worker:  Trula Slade, LCSWA  11/24/2012 9:24 AM   Other:  11/24/2012 9:24 AM   Other:   Other:   Other:     Other:    Other:    Other:    Other:    Other:    Other:     Scribe for Treatment Team:   Trula Slade, LCSWA, 11/24/2012 , 9:24 AM

## 2012-11-24 NOTE — Progress Notes (Signed)
Patient ID: [name], female   DOB: 06/02/1980, 32 y.o.   MRN: [id number]  D: Patient excited about discharge today. Reports that she is going to try her best to do well. States she will still try to get into [facility]. No SI or depression. A: Obtained all medications, prescriptions, bus pass, belongings, and follow up appointments given. R: Got bus pass at this time

## 2012-11-24 NOTE — Progress Notes (Signed)
[facility] Adult Case Management Discharge Plan :  Will you be returning to the same living situation after discharge: Yes,  home until [facility] bed becomes available. At discharge, do you have transportation home?:Yes,  pt lives within walking distance of [facility] Do you have the ability to pay for your medications:Yes,  Medicare  Release of information consent forms completed and in the chart;  Patient's signature needed at discharge.  Patient to Follow up at: Follow-up Information   Follow up with [facility]. (No bed available today per Tanika at [facility] Call daily at 9:15AM to check for bed availability. )    Contact information:   [address] [place], [place] [address] Phone: [phone] Fax: [phone]      Follow up with [facility]. (Walk in between 8am-9am Monday through Friday for hospital follow-up/medication management. )    Contact information:   [address], [place] [address] Phone: [phone] Fax: [phone]      Patient denies SI/HI:   Yes,  during group/self report.     Safety Planning and Suicide Prevention discussed:  Yes,  SPE completed with pt's mother. SPI pamphlet provided to pt and she was encouraged to share this information with her support network.   Smart, Alvetta Hidrogo 11/24/2012, 9:27 AM

## 2012-11-24 NOTE — [facility] Group Notes (Signed)
[facility] Group Notes:  (Nursing/MHT/Case Management/Adjunct)  Date:  11/24/2012  Time:  10:24 AM  Type of Therapy:  Nurse Education  Participation Level:  Did Not Attend  Participation Quality:  did not attend  Affect:  did not attend  Cognitive:  did not attend  Insight:  None  Engagement in Group:  did not attend  Modes of Intervention:  did not attend  Summary of Progress/Problems:did not attend  Andres Ege 11/24/2012, 10:24 AM

## 2012-11-24 NOTE — Progress Notes (Signed)
Recreation Therapy Notes  Date: 11.11.2014 Time: 2:30pm Location: [address] Day[address]  Group Topic: Animal Assisted Activities (AAA)  Behavioral Response: Did not attend.   Khaza Blansett L Pamula Luther, LRT/CTRS  Teigan Sahli L 11/24/2012 5:07 PM 

## 2012-11-24 NOTE — Progress Notes (Signed)
Adult Psychoeducational Group Note  Date:  11/24/2012 Time:  1:14 PM  Group Topic/Focus:  Recovery Goals:   The focus of this group is to identify appropriate goals for recovery and establish a plan to achieve them.  Participation Level:  Did Not Attend  Additional Comments:  Pt was encouraged to attend group but pt stayed in bed and slept.   Cathlean Cower 11/24/2012, 1:14 PM
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
## 2012-11-24 NOTE — [facility] Group Notes (Signed)
[facility] LCSW Group Therapy  11/24/2012 2:43 PM  Type of Therapy:  Group Therapy  Participation Level:  Minimal  Participation Quality:  Inattentive  Affect:  Excited  Cognitive:  Lacking  Insight:  Distracting  Engagement in Therapy:  Distracting  Modes of Intervention:  Discussion, Education, Exploration, Socialization and Support  Summary of Progress/Problems:  MHA speaker did not come to group today. CSW educated patients about various community resources Conservator, museum/gallery, dental clinic, and [facility]. CSW reviewed various groups and services offered by [facility]. Pt invited to ask questions or discuss any experiences with these services and share this information with other group members. [name] came to group late and immediately began having a side conversation while CSW was reviewing resource information. CSW asked pt to please stop talking and asked pt if she had any questions about the resources being reviewed. Pt left group after about five minutes and did not return. She was found playing a game of cards with another patient that did not attend group after group terminated. [name] does not demonstrate progress in the group setting at this time AEB her inability to remain attentive, demonstrate respect for others by not having side conversations, and her inability to openly participate in group discussion.   Cain, Alison Prindle 11/24/2012, 2:43 PM

## 2012-11-27 NOTE — Progress Notes (Signed)
Patient Discharge Instructions:  After Visit Summary (AVS):   Faxed to:  11/27/12 Psychiatric Admission Assessment Note:   Faxed to:  11/27/12 Suicide Risk Assessment - Discharge Assessment:   Faxed to:  11/27/12 Faxed/Sent to the Next Level Care provider:  11/27/12 Faxed to [facility] @ [phone] Faxed to [facility] @ [phone] Jerelene Redden, 11/27/2012, 4:04 PM
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
## 2013-07-26 ENCOUNTER — Other Ambulatory Visit: Payer: Self-pay

## 2013-07-26 ENCOUNTER — Emergency Department (HOSPITAL_COMMUNITY)
Admission: EM | Admit: 2013-07-26 | Discharge: 2013-07-26 | Discharge status: Against Medical Advice | Payer: Medicare Other | Attending: Emergency Medicine | Admitting: Emergency Medicine

## 2013-07-26 ENCOUNTER — Encounter (HOSPITAL_COMMUNITY): Payer: Self-pay | Admitting: Emergency Medicine

## 2013-07-26 ENCOUNTER — Emergency Department (HOSPITAL_COMMUNITY): Payer: Medicare Other

## 2013-07-26 DIAGNOSIS — Z3202 Encounter for pregnancy test, result negative: Secondary | ICD-10-CM | POA: Insufficient documentation

## 2013-07-26 DIAGNOSIS — Z87440 Personal history of urinary (tract) infections: Secondary | ICD-10-CM | POA: Diagnosis not present

## 2013-07-26 DIAGNOSIS — Z8659 Personal history of other mental and behavioral disorders: Secondary | ICD-10-CM | POA: Diagnosis not present

## 2013-07-26 DIAGNOSIS — Z8619 Personal history of other infectious and parasitic diseases: Secondary | ICD-10-CM | POA: Insufficient documentation

## 2013-07-26 DIAGNOSIS — Z79899 Other long term (current) drug therapy: Secondary | ICD-10-CM | POA: Insufficient documentation

## 2013-07-26 DIAGNOSIS — R5381 Other malaise: Secondary | ICD-10-CM | POA: Insufficient documentation

## 2013-07-26 DIAGNOSIS — F17200 Nicotine dependence, unspecified, uncomplicated: Secondary | ICD-10-CM | POA: Diagnosis not present

## 2013-07-26 DIAGNOSIS — R5383 Other fatigue: Secondary | ICD-10-CM

## 2013-07-26 DIAGNOSIS — IMO0001 Reserved for inherently not codable concepts without codable children: Secondary | ICD-10-CM | POA: Diagnosis not present

## 2013-07-26 DIAGNOSIS — Z8742 Personal history of other diseases of the female genital tract: Secondary | ICD-10-CM | POA: Diagnosis not present

## 2013-07-26 DIAGNOSIS — F1910 Other psychoactive substance abuse, uncomplicated: Secondary | ICD-10-CM

## 2013-07-26 DIAGNOSIS — F10229 Alcohol dependence with intoxication, unspecified: Secondary | ICD-10-CM | POA: Diagnosis present

## 2013-07-26 DIAGNOSIS — F1320 Sedative, hypnotic or anxiolytic dependence, uncomplicated: Secondary | ICD-10-CM | POA: Diagnosis not present

## 2013-07-26 LAB — COMPREHENSIVE METABOLIC PANEL
ALT: 12 U/L (ref 0–35)
AST: 15 U/L (ref 0–37)
Albumin: 4.1 g/dL (ref 3.5–5.2)
Alkaline Phosphatase: 72 U/L (ref 39–117)
Anion gap: 13 (ref 5–15)
BILIRUBIN TOTAL: 0.6 mg/dL (ref 0.3–1.2)
BUN: 15 mg/dL (ref 6–23)
CALCIUM: 9.7 mg/dL (ref 8.4–10.5)
CHLORIDE: 102 meq/L (ref 96–112)
CO2: 24 mEq/L (ref 19–32)
CREATININE: 0.8 mg/dL (ref 0.50–1.10)
GLUCOSE: 88 mg/dL (ref 70–99)
Potassium: 4 mEq/L (ref 3.7–5.3)
Sodium: 139 mEq/L (ref 137–147)
Total Protein: 7.6 g/dL (ref 6.0–8.3)

## 2013-07-26 LAB — RAPID URINE DRUG SCREEN, HOSP PERFORMED
AMPHETAMINES: NOT DETECTED
Barbiturates: NOT DETECTED
Benzodiazepines: POSITIVE — AB
Cocaine: NOT DETECTED
Opiates: NOT DETECTED
Tetrahydrocannabinol: NOT DETECTED

## 2013-07-26 LAB — CBC
HCT: 38.4 % (ref 36.0–46.0)
HEMOGLOBIN: 13.4 g/dL (ref 12.0–15.0)
MCH: 29.8 pg (ref 26.0–34.0)
MCHC: 34.9 g/dL (ref 30.0–36.0)
MCV: 85.5 fL (ref 78.0–100.0)
PLATELETS: 237 10*3/uL (ref 150–400)
RBC: 4.49 MIL/uL (ref 3.87–5.11)
RDW: 13.3 % (ref 11.5–15.5)
WBC: 9.2 10*3/uL (ref 4.0–10.5)

## 2013-07-26 LAB — SALICYLATE LEVEL: Salicylate Lvl: <2 mg/dL — ABNORMAL LOW (ref 2.8–20.0)

## 2013-07-26 LAB — POC URINE PREG, ED: PREG TEST UR: NEGATIVE

## 2013-07-26 LAB — ETHANOL: Alcohol, Ethyl (B): <11 mg/dL (ref 0–11)

## 2013-07-26 LAB — ACETAMINOPHEN LEVEL: Acetaminophen (Tylenol), Serum: <15 ug/mL (ref 10–30)

## 2013-07-26 NOTE — ED Notes (Signed)
Pt reports to ED for need for change. Pt states she was on vistaril, neurontin, cybaxan, pt states she has drank about 1 can of 20 oz beer and 4 xanax pills totally 1 mg. Pt has poor attention/concentration, appears intoxicated. Pt c/o weakness.

## 2013-07-26 NOTE — ED Notes (Signed)
Pt left AMA °
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
## 2013-07-26 NOTE — ED Provider Notes (Signed)
Date: 07/26/2013  Rate: 85  Rhythm: normal sinus rhythm  QRS Axis: normal  Intervals: normal  ST/T Wave abnormalities: normal  Conduction Disutrbances:none  Narrative Interpretation: Normal EKG     [name], MD 07/26/13 1824

## 2013-07-30 NOTE — ED Provider Notes (Signed)
Medical screening examination/treatment/procedure(s) were performed by non-physician practitioner and as supervising physician I was immediately available for consultation/collaboration.   EKG Interpretation None        Gilda Creasehristopher J. Linwood Gullikson, MD 07/30/13 1459

## 2013-08-10 ENCOUNTER — Emergency Department (HOSPITAL_COMMUNITY)
Admission: EM | Admit: 2013-08-10 | Discharge: 2013-08-11 | Disposition: A | Discharge status: Home / Self Care | Payer: Medicare Other | Attending: Emergency Medicine | Admitting: Emergency Medicine

## 2013-08-10 DIAGNOSIS — Z8739 Personal history of other diseases of the musculoskeletal system and connective tissue: Secondary | ICD-10-CM | POA: Insufficient documentation

## 2013-08-10 DIAGNOSIS — Z8619 Personal history of other infectious and parasitic diseases: Secondary | ICD-10-CM | POA: Insufficient documentation

## 2013-08-10 DIAGNOSIS — S61219A Laceration without foreign body of unspecified finger without damage to nail, initial encounter: Secondary | ICD-10-CM

## 2013-08-10 DIAGNOSIS — Z8659 Personal history of other mental and behavioral disorders: Secondary | ICD-10-CM | POA: Diagnosis not present

## 2013-08-10 DIAGNOSIS — Z87440 Personal history of urinary (tract) infections: Secondary | ICD-10-CM | POA: Insufficient documentation

## 2013-08-10 DIAGNOSIS — W268XXA Contact with other sharp object(s), not elsewhere classified, initial encounter: Secondary | ICD-10-CM | POA: Insufficient documentation

## 2013-08-10 DIAGNOSIS — Z79899 Other long term (current) drug therapy: Secondary | ICD-10-CM | POA: Diagnosis not present

## 2013-08-10 DIAGNOSIS — Y9389 Activity, other specified: Secondary | ICD-10-CM | POA: Diagnosis not present

## 2013-08-10 DIAGNOSIS — S61209A Unspecified open wound of unspecified finger without damage to nail, initial encounter: Secondary | ICD-10-CM | POA: Insufficient documentation

## 2013-08-10 DIAGNOSIS — F17200 Nicotine dependence, unspecified, uncomplicated: Secondary | ICD-10-CM | POA: Insufficient documentation

## 2013-08-10 DIAGNOSIS — Y9289 Other specified places as the place of occurrence of the external cause: Secondary | ICD-10-CM | POA: Insufficient documentation

## 2013-08-11 ENCOUNTER — Encounter (HOSPITAL_COMMUNITY): Payer: Self-pay | Admitting: Emergency Medicine

## 2013-08-11 DIAGNOSIS — S61209A Unspecified open wound of unspecified finger without damage to nail, initial encounter: Secondary | ICD-10-CM | POA: Diagnosis not present

## 2013-08-11 MED ORDER — IBUPROFEN 800 MG PO TABS
800.0000 mg | ORAL_TABLET | Freq: Once | ORAL | Status: AC
  Administered 2013-08-11: 800 mg via ORAL
  Filled 2013-08-11: qty 1

## 2013-08-11 NOTE — ED Notes (Addendum)
Previous note documented on wrong pt
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
## 2013-08-11 NOTE — ED Notes (Addendum)
Pt reports she was opening a can and cut her L middle finger.  Lac noted.  Mild bleeding noted.  etoh on board

## 2013-08-11 NOTE — ED Provider Notes (Signed)
Medical screening examination/treatment/procedure(s) were performed by non-physician practitioner and as supervising physician I was immediately available for consultation/collaboration.   EKG Interpretation None        Kristen N Ward, DO 08/11/13 0138 

## 2013-08-11 NOTE — Discharge Instructions (Signed)

## 2013-10-31 ENCOUNTER — Emergency Department (HOSPITAL_COMMUNITY)
Admission: EM | Admit: 2013-10-31 | Discharge: 2013-10-31 | Disposition: A | Discharge status: Home / Self Care | Payer: Medicare Other | Attending: Emergency Medicine | Admitting: Emergency Medicine

## 2013-10-31 ENCOUNTER — Encounter (HOSPITAL_COMMUNITY): Payer: Self-pay | Admitting: Emergency Medicine

## 2013-10-31 DIAGNOSIS — J029 Acute pharyngitis, unspecified: Secondary | ICD-10-CM | POA: Insufficient documentation

## 2013-10-31 DIAGNOSIS — Z8659 Personal history of other mental and behavioral disorders: Secondary | ICD-10-CM | POA: Diagnosis not present

## 2013-10-31 DIAGNOSIS — R05 Cough: Secondary | ICD-10-CM | POA: Insufficient documentation

## 2013-10-31 DIAGNOSIS — Z8619 Personal history of other infectious and parasitic diseases: Secondary | ICD-10-CM | POA: Insufficient documentation

## 2013-10-31 DIAGNOSIS — H9201 Otalgia, right ear: Secondary | ICD-10-CM | POA: Diagnosis not present

## 2013-10-31 DIAGNOSIS — Z658 Other specified problems related to psychosocial circumstances: Secondary | ICD-10-CM | POA: Insufficient documentation

## 2013-10-31 DIAGNOSIS — Z720 Tobacco use: Secondary | ICD-10-CM | POA: Insufficient documentation

## 2013-10-31 DIAGNOSIS — Z79899 Other long term (current) drug therapy: Secondary | ICD-10-CM | POA: Insufficient documentation

## 2013-10-31 DIAGNOSIS — Z87440 Personal history of urinary (tract) infections: Secondary | ICD-10-CM | POA: Insufficient documentation

## 2013-10-31 DIAGNOSIS — L299 Pruritus, unspecified: Secondary | ICD-10-CM

## 2013-10-31 MED ORDER — GUAIFENESIN 100 MG/5ML PO SYRP: 100.0000 mg | ORAL_SOLUTION | ORAL | Status: DC | PRN

## 2013-10-31 MED ORDER — DIPHENHYDRAMINE HCL 25 MG PO TABS: 25.0000 mg | ORAL_TABLET | Freq: Four times a day (QID) | ORAL | Status: DC

## 2013-10-31 NOTE — Discharge Instructions (Signed)
Adenovirus °Adenoviruses are viruses that usually cause breathing problems. They may also cause other illnesses, such as stomach flu, bladder infection, and rashes. °CAUSES  °Adenoviruses are passed by direct contact. This can happen from touching the contaminated hands of someone who has just gone to the bathroom. It can also be passed through contaminated water. °· You may have the virus and give it to others without being sick yourself. °· Some types of this virus occur naturally in most parts of the world. Most of these infections occur in children. °· Epidemics are often centered around swimming pools and small lakes. Symptoms can include fever and pink eye. °· Adenovirus 7 is a specific virus gotten by breathing in the virus. It typically causes severe problems in the breathing system. Patients who get the adenovirus by the mouth usually have less severe symptoms. Adenovirus caught by breathing in the virus is more common in the late winter, spring, and early summer. °SYMPTOMS  °Symptoms vary and can include:  °· Common cold symptoms. °· Pneumonia. °· Croup. °· Bronchitis. °Patients with HIV, transplant patients, and some cancer patients are more likely to have severe problems. Acute respiratory disease (ARD) can be caused by adenovirus in crowded conditions.  °These viruses are not easily killed with common cleaning products. °DIAGNOSIS  °Blood tests can be used to identify the problem.  °TREATMENT  °Most infections are mild and require no therapy. The symptoms can be treated to make the patient comfortable.  °Document Released: 03/23/2002 Document Revised: 03/25/2011 Document Reviewed: 05/05/2013 °[employer]® Patient Information ©2015 [employer]. This information is not intended to replace advice given to you by your health care provider. Make sure you discuss any questions you have with your health care provider. ° °
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
## 2013-10-31 NOTE — ED Notes (Signed)
Pt not in room at this time RN made aware

## 2013-10-31 NOTE — ED Notes (Signed)
Pt states she has had sore throat, 8/10 right ear pain, and cough for the past 2 days. Pt states she began having symptoms at the same time son became sick with same symptoms.

## 2013-10-31 NOTE — ED Notes (Signed)
Pt left prior to receiving D/C papers

## 2013-11-03 NOTE — ED Provider Notes (Signed)
Medical screening examination/treatment/procedure(s) were performed by non-physician practitioner and as supervising physician I was immediately available for consultation/collaboration.   EKG Interpretation None        Latarshia Jersey J. Graciela Plato, MD 11/03/13 1448 

## 2013-11-15 ENCOUNTER — Encounter (HOSPITAL_COMMUNITY): Payer: Self-pay | Admitting: Emergency Medicine

## 2013-12-29 ENCOUNTER — Encounter (HOSPITAL_COMMUNITY): Payer: Self-pay | Admitting: *Deleted

## 2013-12-29 ENCOUNTER — Emergency Department (HOSPITAL_COMMUNITY)
Admission: EM | Admit: 2013-12-29 | Discharge: 2013-12-29 | Disposition: A | Discharge status: Home / Self Care | Payer: Medicare Other | Attending: Emergency Medicine | Admitting: Emergency Medicine

## 2013-12-29 DIAGNOSIS — R197 Diarrhea, unspecified: Secondary | ICD-10-CM | POA: Diagnosis present

## 2013-12-29 DIAGNOSIS — M797 Fibromyalgia: Secondary | ICD-10-CM | POA: Diagnosis not present

## 2013-12-29 DIAGNOSIS — Z8619 Personal history of other infectious and parasitic diseases: Secondary | ICD-10-CM | POA: Diagnosis not present

## 2013-12-29 DIAGNOSIS — Z87440 Personal history of urinary (tract) infections: Secondary | ICD-10-CM | POA: Insufficient documentation

## 2013-12-29 DIAGNOSIS — Z720 Tobacco use: Secondary | ICD-10-CM | POA: Diagnosis not present

## 2013-12-29 DIAGNOSIS — R112 Nausea with vomiting, unspecified: Secondary | ICD-10-CM | POA: Insufficient documentation

## 2013-12-29 DIAGNOSIS — Z79899 Other long term (current) drug therapy: Secondary | ICD-10-CM | POA: Diagnosis not present

## 2013-12-29 DIAGNOSIS — Z8659 Personal history of other mental and behavioral disorders: Secondary | ICD-10-CM | POA: Insufficient documentation

## 2013-12-29 DIAGNOSIS — R1110 Vomiting, unspecified: Secondary | ICD-10-CM

## 2013-12-29 MED ORDER — ONDANSETRON 8 MG PO TBDP: 8.0000 mg | ORAL_TABLET | Freq: Three times a day (TID) | ORAL | Status: DC | PRN

## 2013-12-29 MED ORDER — ONDANSETRON 8 MG PO TBDP
8.0000 mg | ORAL_TABLET | Freq: Once | ORAL | Status: AC
  Administered 2013-12-29: 8 mg via ORAL
  Filled 2013-12-29: qty 1

## 2013-12-29 MED ORDER — PROMETHAZINE HCL 25 MG PO TABS: 25.0000 mg | ORAL_TABLET | Freq: Four times a day (QID) | ORAL | Status: DC | PRN

## 2013-12-29 NOTE — Discharge Instructions (Signed)

## 2013-12-29 NOTE — ED Notes (Signed)
Stools have been loose and denies any blood in stool. Denies any problems with urination but started menstrual cycle 3 days ago.

## 2013-12-29 NOTE — ED Notes (Signed)
Pt reports that she woke up this am with N/V/D; pt states that she has vomited 5 times and had diarrhea x 3 since this am; pt c/o abd cramping w/ vomiting and diarrhea
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
## 2014-04-24 IMAGING — CT CT ABD-PELV W/ CM
1 of 2 series · 14 of 32 positions shown, 18 images · IV contrast (OMNIPAQUE 300)
Comparison: Abdominal ultrasound 02/02/2012

CLINICAL DATA: Clinical diagnosis of acute pancreatitis.  Recent
heavy alcohol use.  Lipase of [DATE].  Abdominal pain.

CT ABDOMEN AND PELVIS WITH CONTRAST
TECHNIQUE: Multidetector CT imaging of the abdomen and pelvis was
performed following the standard protocol during bolus
administration of intravenous contrast.
Contrast: 100mL OMNIPAQUE IOHEXOL 300 MG/ML  SOLN

[Series 2: abd/pel with · axial · 0.68mm/px · z∈[+847,+1242]mm · 14 of 91 slices shown, 18 images]
[im 8/91  soft-tissue]
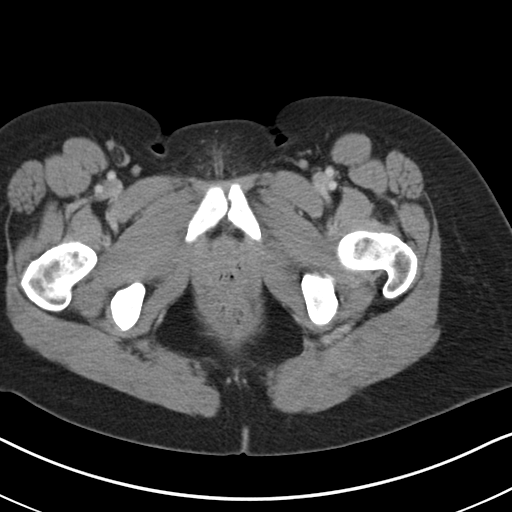
[im 8/91  bone]
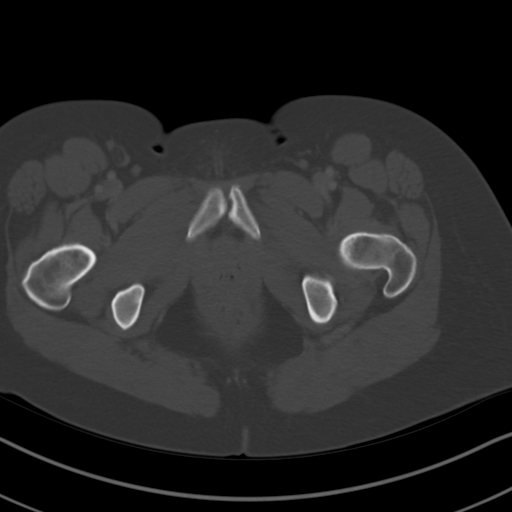
[im 15/91  soft-tissue]
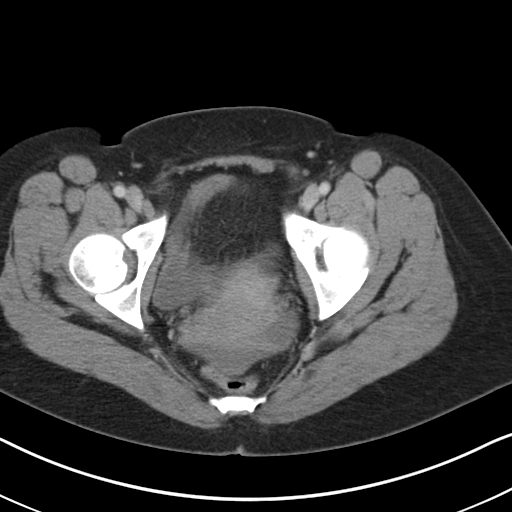
[im 22/91  soft-tissue]
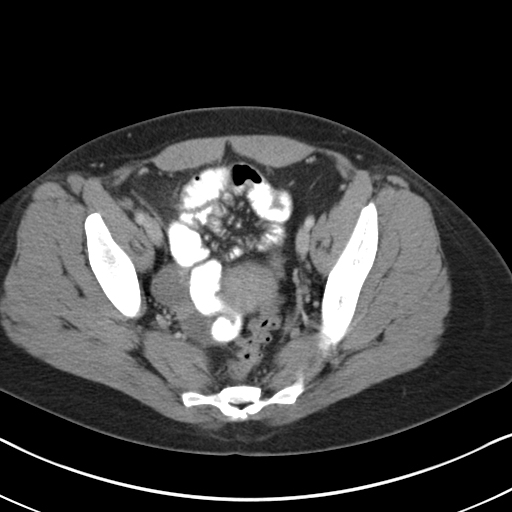
[im 29/91  soft-tissue]
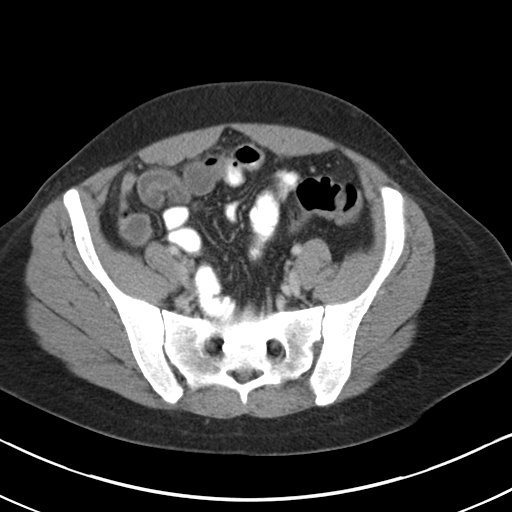
[im 37/91  soft-tissue]
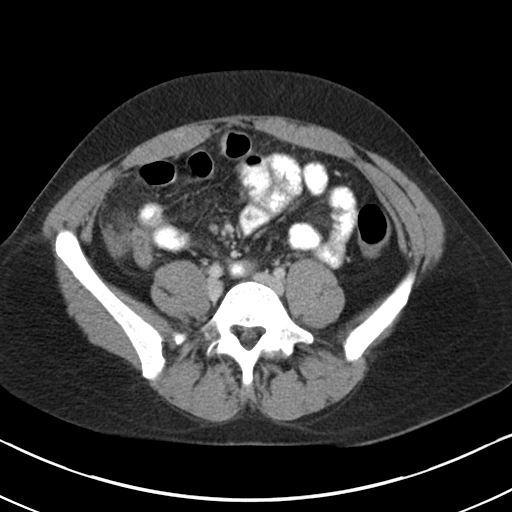
[im 44/91  soft-tissue]
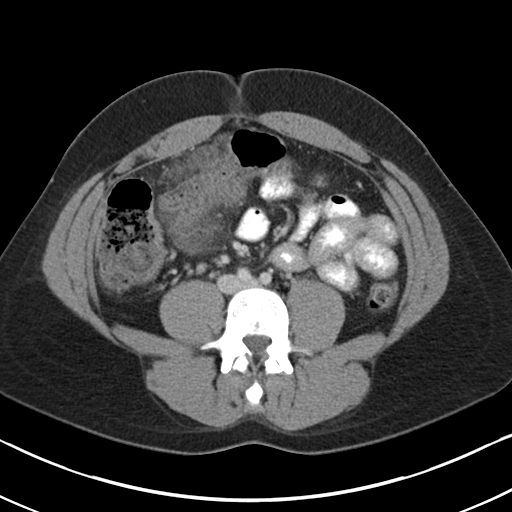
[im 51/91  soft-tissue]
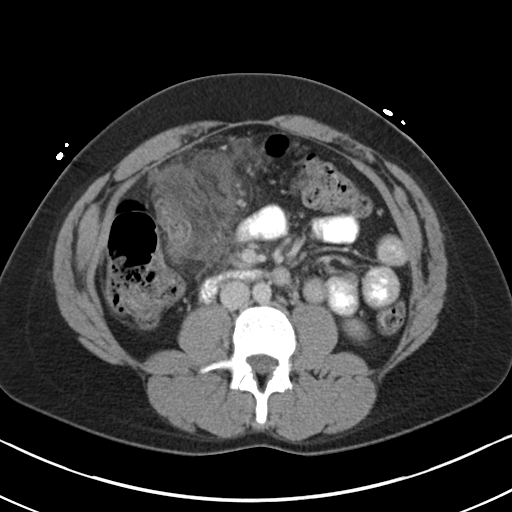
[im 58/91  soft-tissue]
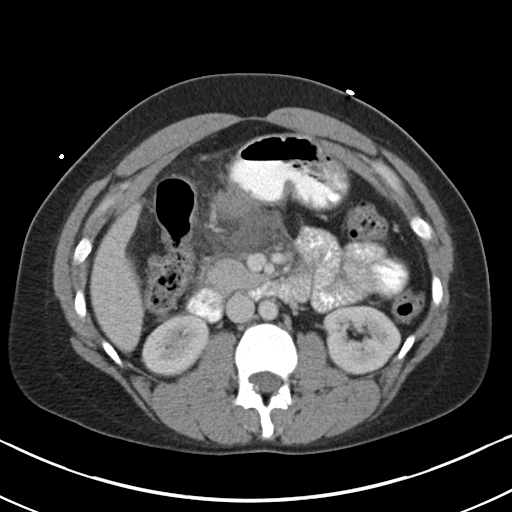
[im 65/91  soft-tissue]
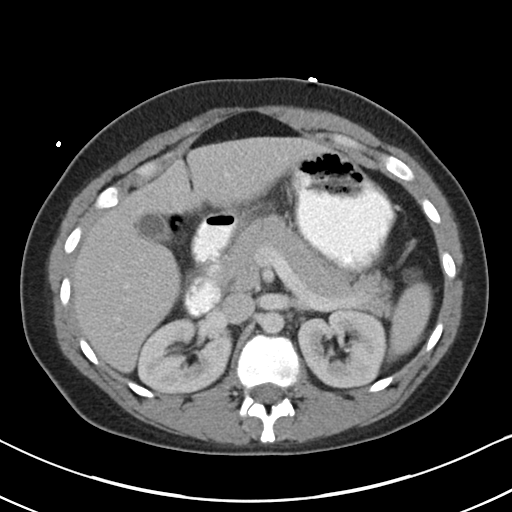
[im 65/91  bone]
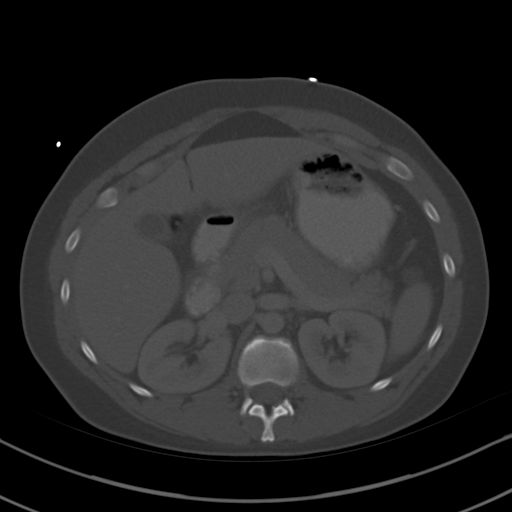
[im 73/91  soft-tissue]
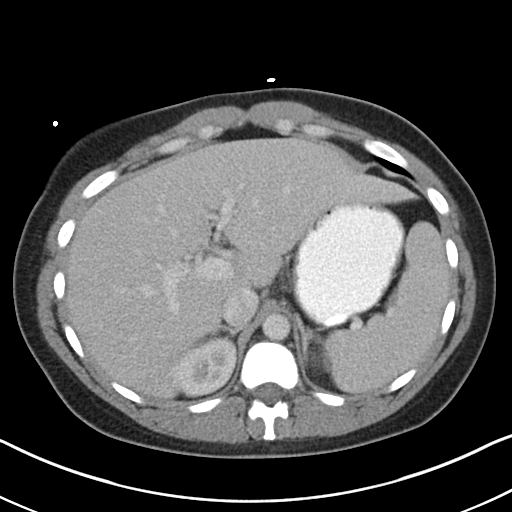
[im 76/91  lung]
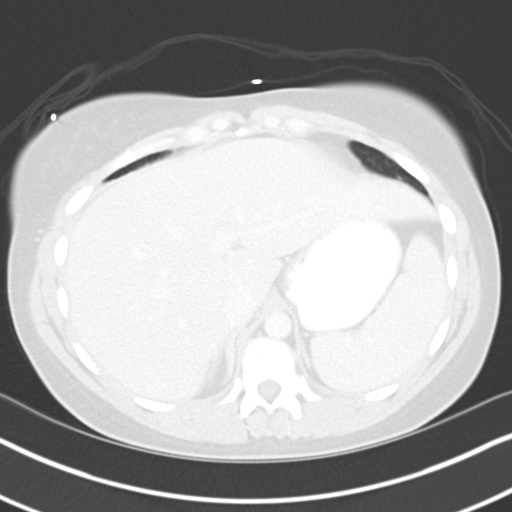
[im 80/91  soft-tissue]
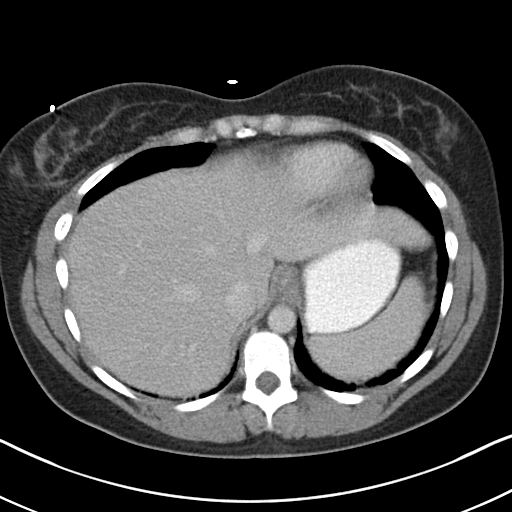
[im 80/91  lung]
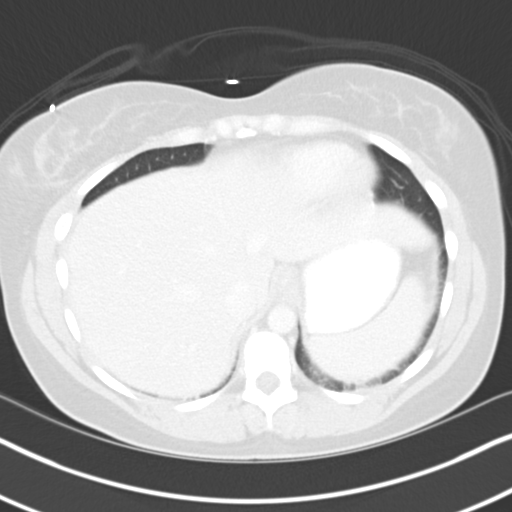
[im 83/91  lung]
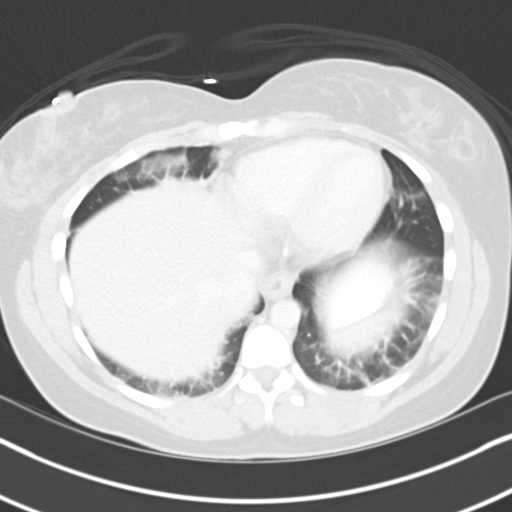
[im 87/91  soft-tissue]
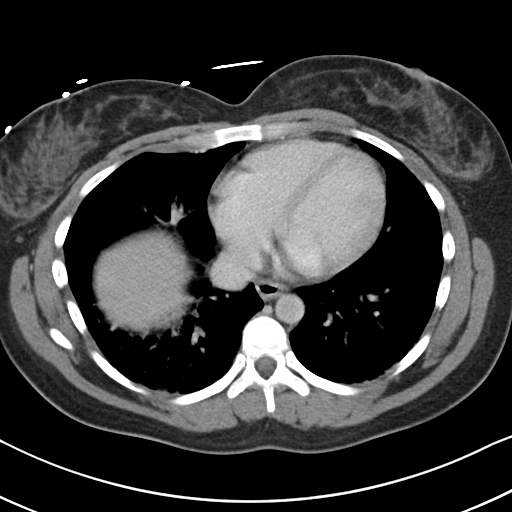
[im 87/91  lung]
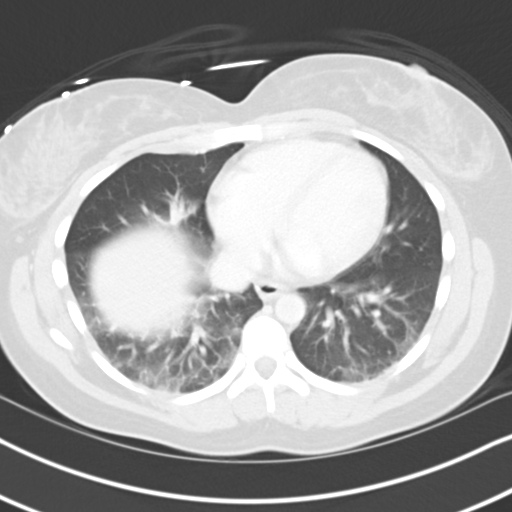

[14 of 32 positions shown; findings below may reference images not displayed]

FINDINGS: There are patchy areas of atelectasis in both lower
lobes.  Negative for pleural effusion.  Imaged portion of the heart
appears upper normal in size.

There is stranding in the peripancreatic fat adjacent to the
pancreatic head, neck, and proximal body.  Extensive mesenteric fat
stranding and fluid extends anterior and inferior to the pancreas
and adjacent to the proximal transverse colon.  Additionally, there
is a small to moderate volume of ascites in the dependent portion
of the pelvis.  This inflammatory stranding and fluid is compatible
with the given diagnosis of pancreatitis.  The pancreas itself
enhances normally.  No evidence of pseudocyst or pancreatic
necrosis.  There is no pancreatic calcification or pancreatic
ductal dilatation.

The liver, gallbladder, spleen, adrenal glands, and kidneys are
within normal limits.

The stomach is well distended with oral contrast and appears
normal.  There is moderate colonic diverticulosis. Probable vaginal
contraceptive ring noted.  Urinary bladder within normal limits.
Uterus and ovaries within normal limits for premenopausal patient.

Abdominal aorta normal in caliber.

Reactive size lymph nodes in the right lower quadrant ileocolic
mesentery.  No pathologic lymphadenopathy in the abdomen or pelvis.

No acute or suspicious bony abnormality is identified.
IMPRESSION: Inflammatory stranding and fluid adjacent to the proximal pancreas
and extending inferiorly in the right abdomen, with small to
moderate ascites in the dependent portion the pelvis.  Findings are
compatible with the clinical and laboratory diagnosis of
pancreatitis.
2.  No evidence of pancreatic necrosis, pseudocyst, or abscess.
3. Colonic diverticulosis.
4.  Probable vaginal contraceptive ring.

## 2014-04-24 IMAGING — US US ABDOMEN COMPLETE
1 series · 14 of 25 positions shown · non-contrast
Comparison: None.

CLINICAL DATA: Abdominal pain

ABDOMINAL ULTRASOUND COMPLETE

[Series 1: us abdomen complete · 0.33mm/px · 14 of 69 slices shown]
[im 1/69]
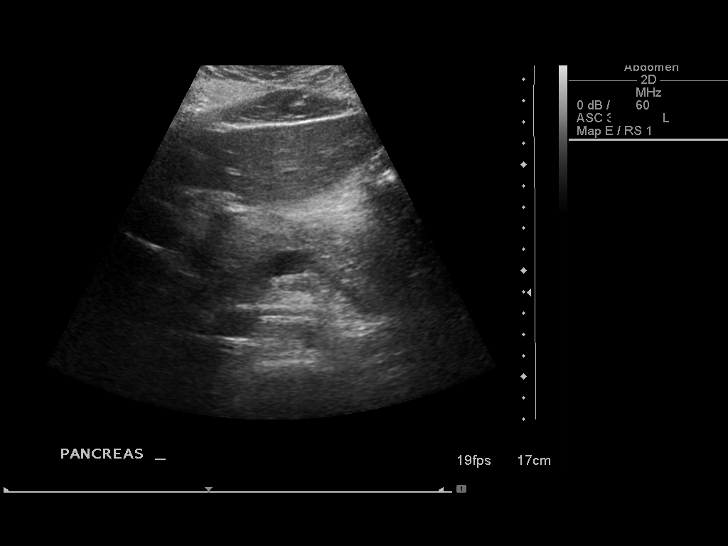
[im 6/69]
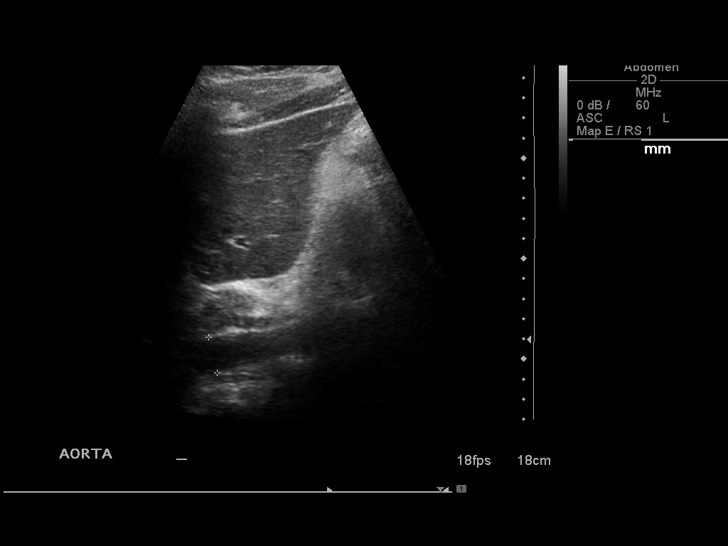
[im 12/69]
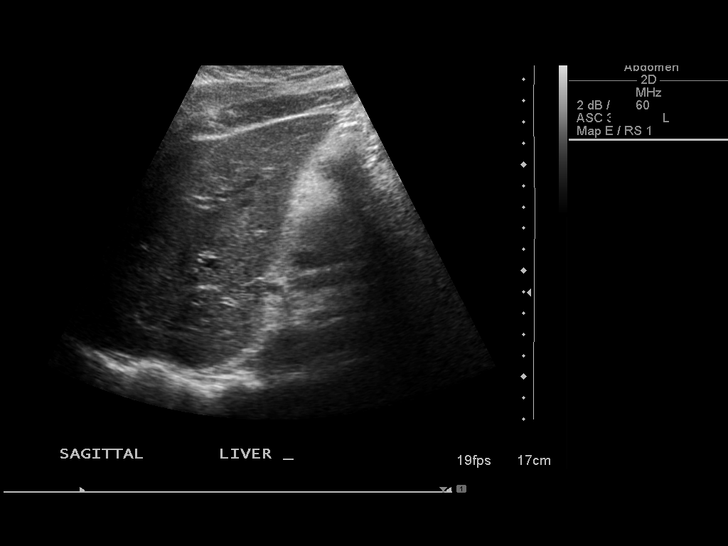
[im 18/69]
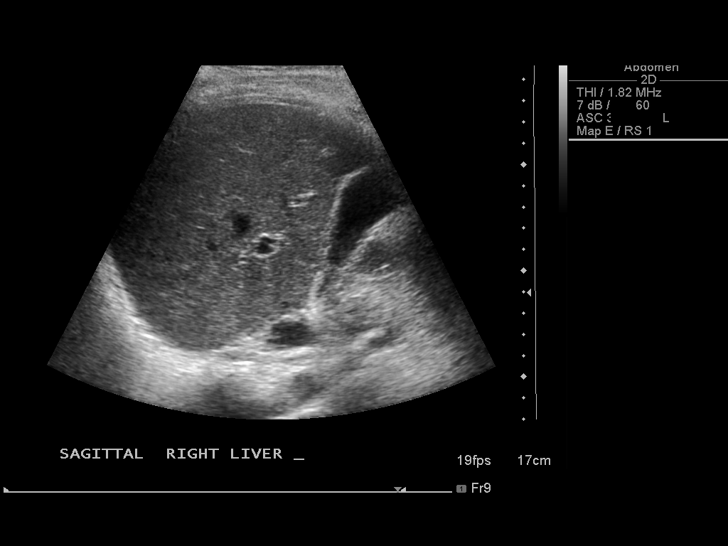
[im 23/69]
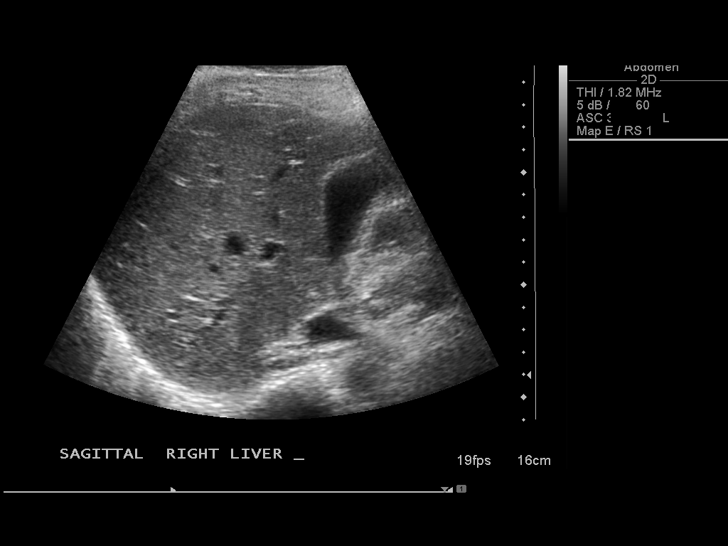
[im 26/69]
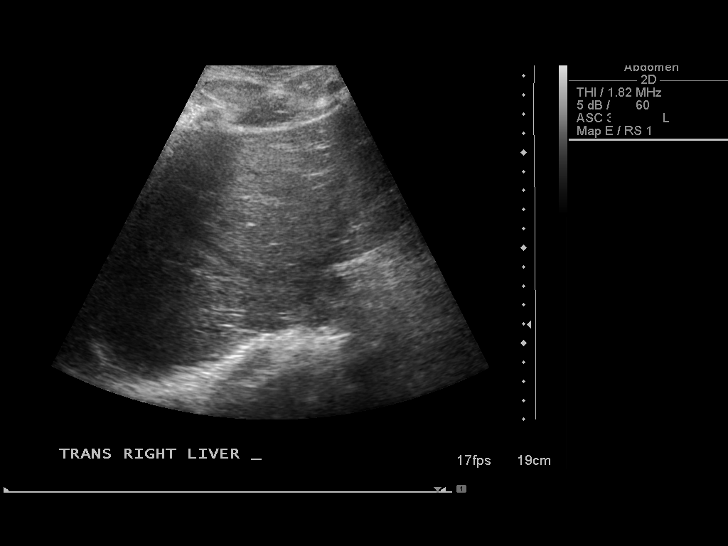
[im 32/69]
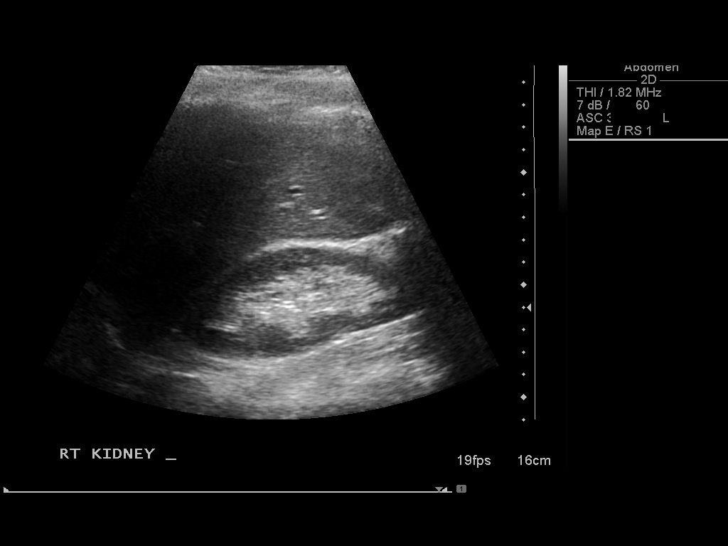
[im 37/69]
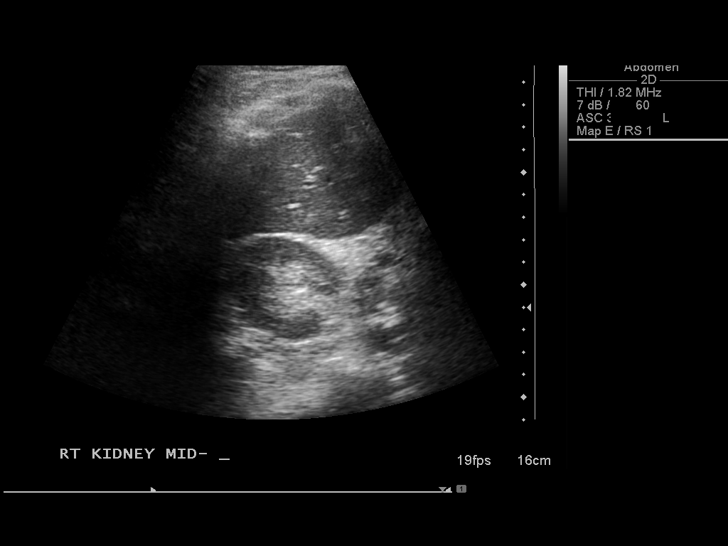
[im 43/69]
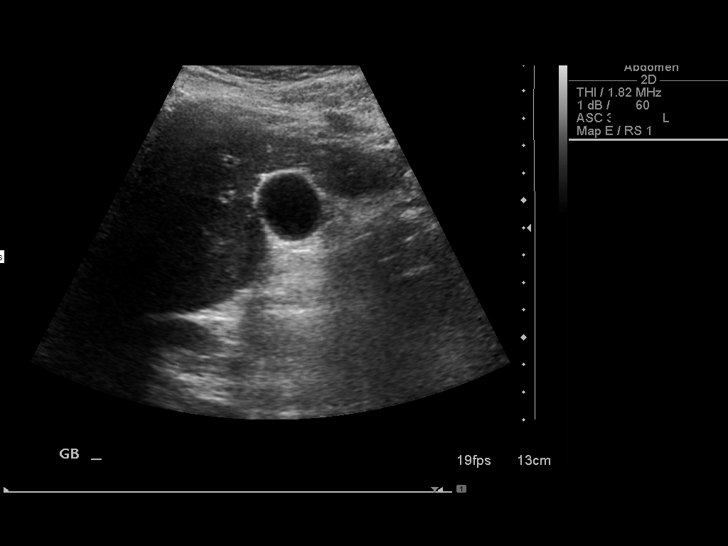
[im 46/69]
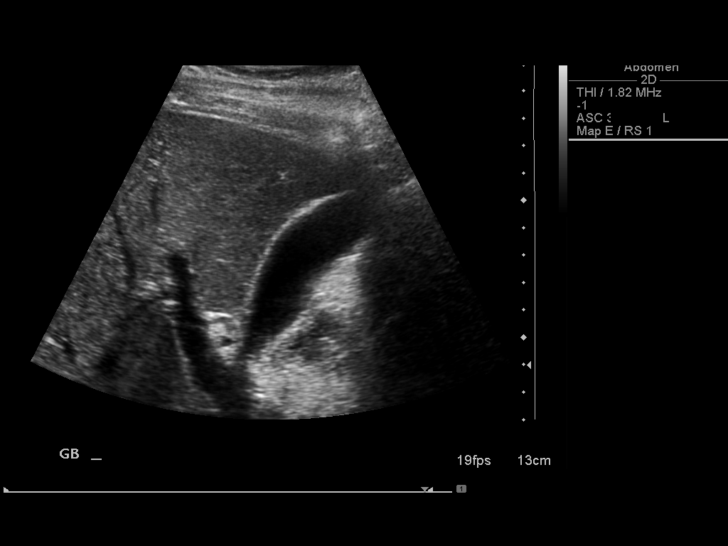
[im 52/69]
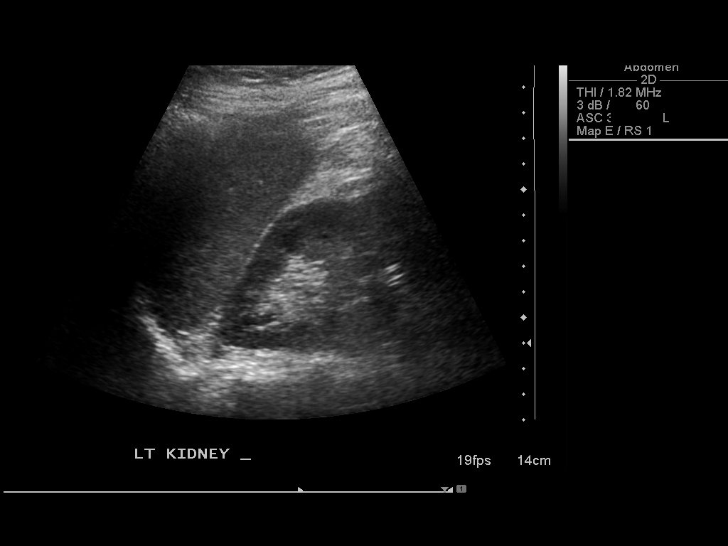
[im 57/69]
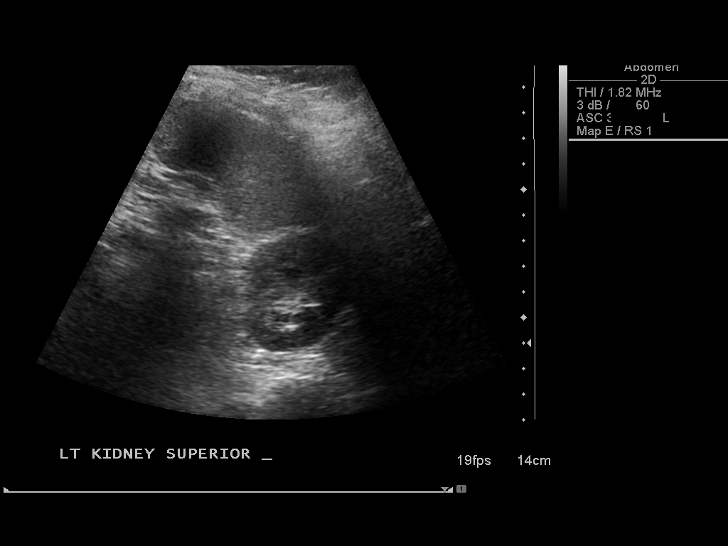
[im 63/69]
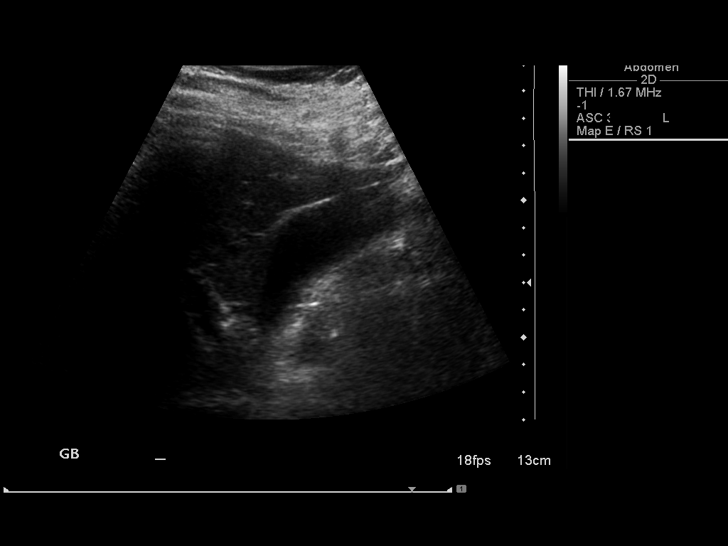
[im 69/69]
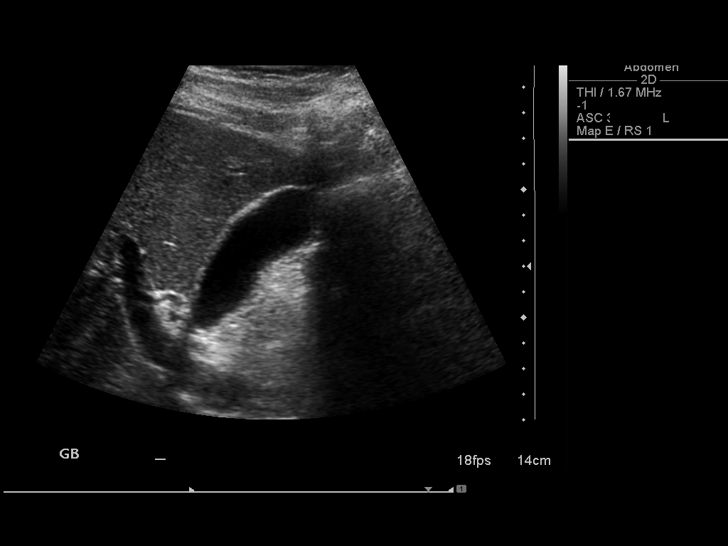

[14 of 25 positions shown; findings below may reference images not displayed]

FINDINGS: Gallbladder:  No gallstones, gallbladder wall thickening, or
pericholecystic fluid. Sonographic Murphy's sign is negative.

Common Bile Duct:  Within normal limits in caliber. Measures
mm.

Liver: No focal mass lesion identified.  Within normal limits in
parenchymal echogenicity.

IVC:  Appears normal.

Pancreas:  Although the pancreas is difficult to visualize in its
entirety, no focal pancreatic abnormality is identified. .

Spleen:  Within normal limits in size and echotexture.

Right kidney:  Normal in size and parenchymal echogenicity.  No
evidence of mass or hydronephrosis.

Left kidney:  Normal in size and parenchymal echogenicity.  No
evidence of mass or hydronephrosis.

Abdominal Aorta:  No aneurysm identified. Distal abdominal aorta
difficult to visualize due to overlying bowel gas.
IMPRESSION: Negative abdominal ultrasound.

## 2014-04-24 IMAGING — CR DG CHEST 2V
2 series · 2 of 2 positions shown · non-contrast
Comparison: None

CLINICAL DATA: Leukocytosis.  Sternal pain.

CHEST - 2 VIEW

[w chest lat]
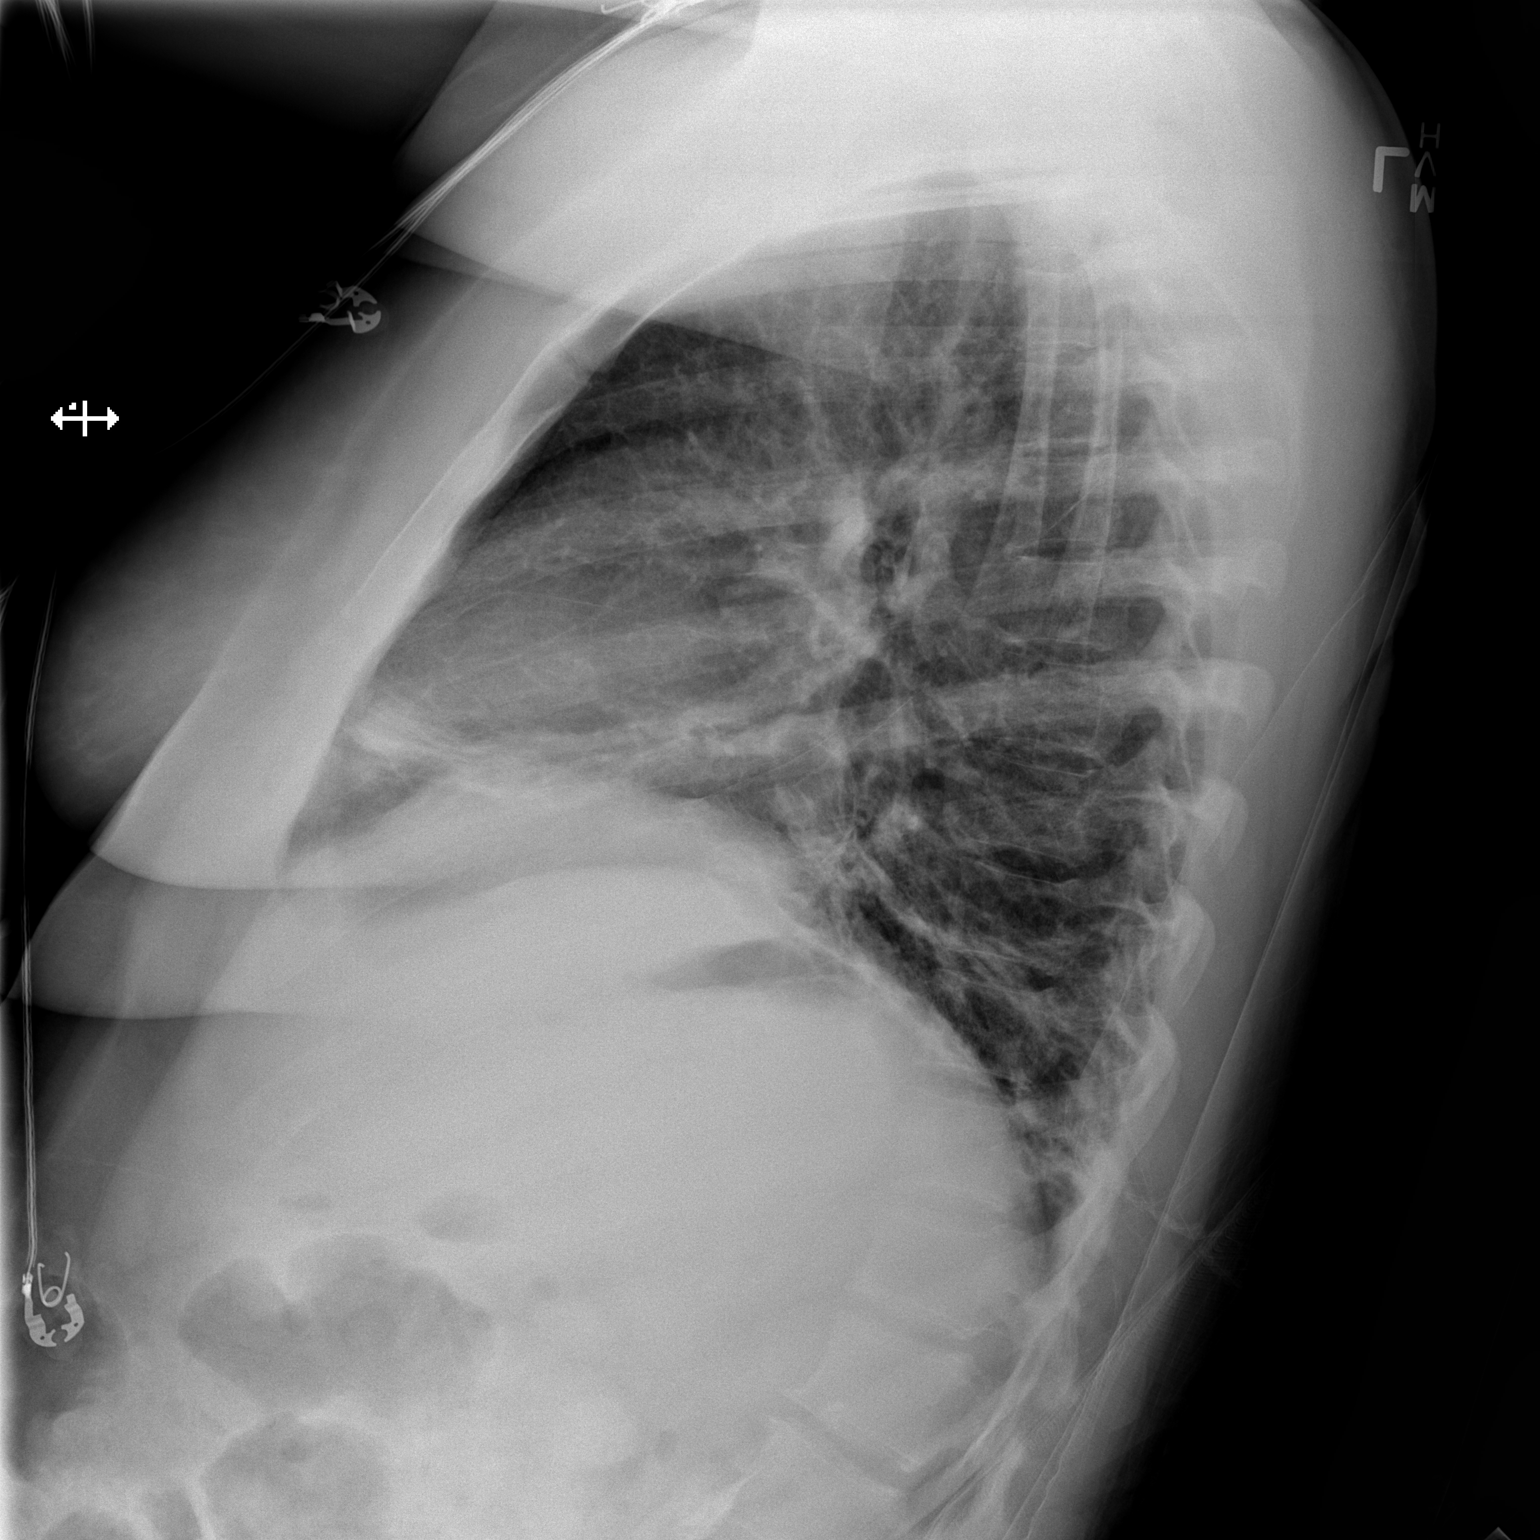

[x chest ap]
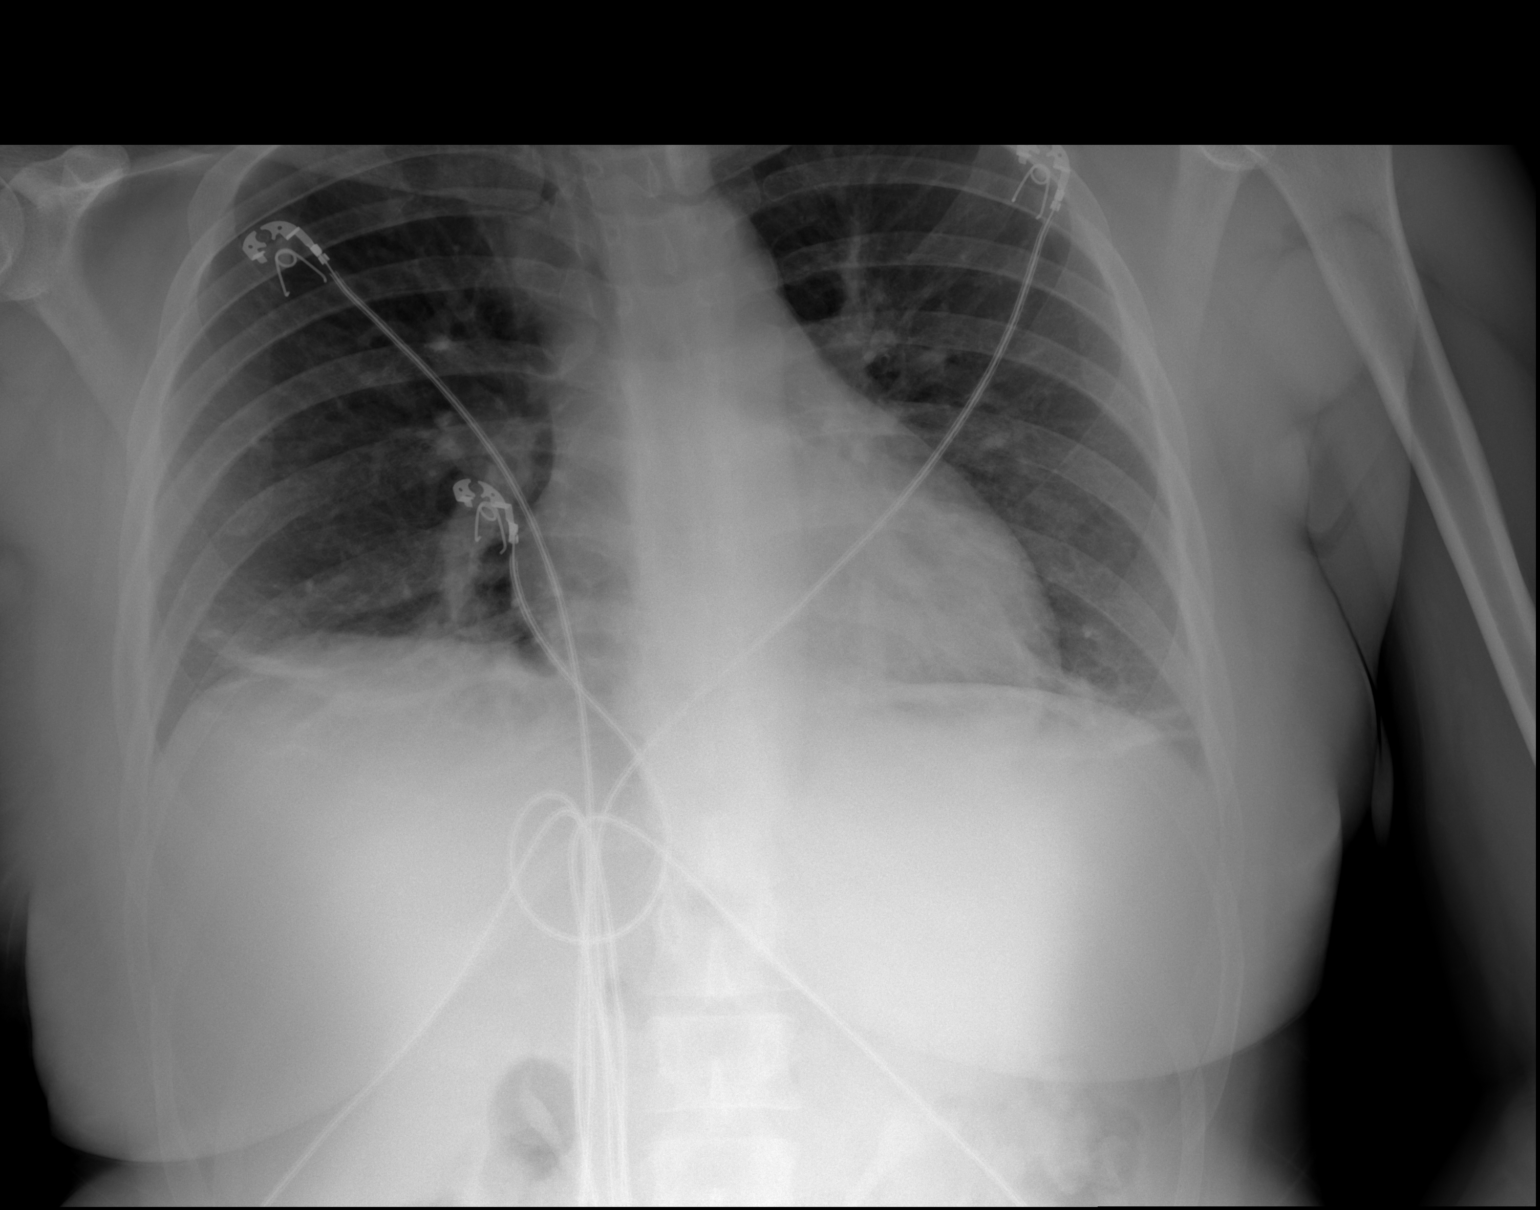

[2 of 2 positions shown; findings below may reference images not displayed]

FINDINGS: The cardiac silhouette, mediastinal and hilar contours
are within normal limits.  There is streaky bibasilar subsegmental
atelectasis without definite infiltrate or effusion.  The bony
thorax is intact.
IMPRESSION: Bibasilar subsegmental atelectasis.

## 2020-11-06 ENCOUNTER — Emergency Department (HOSPITAL_COMMUNITY)
Admission: EM | Admit: 2020-11-06 | Discharge: 2020-11-07 | Disposition: A | Discharge status: Home / Self Care | Payer: Medicare (Managed Care) | Attending: Emergency Medicine | Admitting: Emergency Medicine

## 2020-11-06 ENCOUNTER — Other Ambulatory Visit: Payer: Self-pay

## 2020-11-06 ENCOUNTER — Encounter (HOSPITAL_COMMUNITY): Payer: Self-pay | Admitting: Oncology

## 2020-11-06 DIAGNOSIS — Z20822 Contact with and (suspected) exposure to covid-19: Secondary | ICD-10-CM | POA: Insufficient documentation

## 2020-11-06 DIAGNOSIS — F1910 Other psychoactive substance abuse, uncomplicated: Secondary | ICD-10-CM | POA: Insufficient documentation

## 2020-11-06 DIAGNOSIS — F17210 Nicotine dependence, cigarettes, uncomplicated: Secondary | ICD-10-CM | POA: Insufficient documentation

## 2020-11-06 DIAGNOSIS — Z96651 Presence of right artificial knee joint: Secondary | ICD-10-CM | POA: Insufficient documentation

## 2020-11-06 DIAGNOSIS — R443 Hallucinations, unspecified: Secondary | ICD-10-CM | POA: Insufficient documentation

## 2020-11-06 LAB — CBC WITH DIFFERENTIAL/PLATELET
Abs Immature Granulocytes: 0.05 10*3/uL (ref 0.00–0.07)
Basophils Absolute: 0.1 10*3/uL (ref 0.0–0.1)
Basophils Relative: 0 %
Eosinophils Absolute: 0 10*3/uL (ref 0.0–0.5)
Eosinophils Relative: 0 %
HCT: 43.7 % (ref 36.0–46.0)
Hemoglobin: 15.6 g/dL — ABNORMAL HIGH (ref 12.0–15.0)
Immature Granulocytes: 0 %
Lymphocytes Relative: 26 %
Lymphs Abs: 3.7 10*3/uL (ref 0.7–4.0)
MCH: 30.8 pg (ref 26.0–34.0)
MCHC: 35.7 g/dL (ref 30.0–36.0)
MCV: 86.2 fL (ref 80.0–100.0)
Monocytes Absolute: 1 10*3/uL (ref 0.1–1.0)
Monocytes Relative: 7 %
Neutro Abs: 9.2 10*3/uL — ABNORMAL HIGH (ref 1.7–7.7)
Neutrophils Relative %: 67 %
Platelets: 313 10*3/uL (ref 150–400)
RBC: 5.07 MIL/uL (ref 3.87–5.11)
RDW: 13.5 % (ref 11.5–15.5)
WBC: 14 10*3/uL — ABNORMAL HIGH (ref 4.0–10.5)
nRBC: 0 % (ref 0.0–0.2)

## 2020-11-06 LAB — HCG, QUANTITATIVE, PREGNANCY: hCG, Beta Chain, Quant, S: <1 m[IU]/mL (ref <5)

## 2020-11-06 LAB — COMPREHENSIVE METABOLIC PANEL
ALT: 16 U/L (ref 0–44)
AST: 14 U/L — ABNORMAL LOW (ref 15–41)
Albumin: 4.7 g/dL (ref 3.5–5.0)
Alkaline Phosphatase: 49 U/L (ref 38–126)
Anion gap: 9 (ref 5–15)
BUN: 7 mg/dL (ref 6–20)
CO2: 24 mmol/L (ref 22–32)
Calcium: 9.7 mg/dL (ref 8.9–10.3)
Chloride: 103 mmol/L (ref 98–111)
Creatinine, Ser: 0.91 mg/dL (ref 0.44–1.00)
GFR, Estimated: >60 mL/min (ref >60)
Glucose, Bld: 100 mg/dL — ABNORMAL HIGH (ref 70–99)
Potassium: 3.7 mmol/L (ref 3.5–5.1)
Sodium: 136 mmol/L (ref 135–145)
Total Bilirubin: 1.5 mg/dL — ABNORMAL HIGH (ref 0.3–1.2)
Total Protein: 7.8 g/dL (ref 6.5–8.1)

## 2020-11-06 LAB — ACETAMINOPHEN LEVEL: Acetaminophen (Tylenol), Serum: <10 ug/mL — ABNORMAL LOW (ref 10–30)

## 2020-11-06 LAB — RESP PANEL BY RT-PCR (FLU A&B, COVID) ARPGX2
Influenza A by PCR: NEGATIVE
Influenza B by PCR: NEGATIVE
SARS Coronavirus 2 by RT PCR: NEGATIVE

## 2020-11-06 LAB — SALICYLATE LEVEL: Salicylate Lvl: <7 mg/dL — ABNORMAL LOW (ref 7.0–30.0)

## 2020-11-06 LAB — ETHANOL: Alcohol, Ethyl (B): <10 mg/dL (ref <10)

## 2020-11-06 NOTE — ED Triage Notes (Signed)
Pt states, "I need rehab."  Pt is from [place] recently relocated here.  Pt states she has been out of her rx for suboxone x 2 months.  Has been self medicating w/ cocaine.  Cannot quantify.  Reports daily use, w/ last use PTA.  Pt tearful in triage.

## 2020-11-06 NOTE — ED Provider Notes (Signed)
Emergency Medicine Provider Triage Evaluation Note  [name] , a 40 y.o. female  was evaluated in triage.  Pt complains of wanting rehab.  Patient recently located here from [place].  Patient has been out of her Suboxone for 2 months and has been self-medicating with cocaine.  Admits to drinking 3 16 ounces of beer daily.  Last drank prior to arrival.  Denies HI and SI.  Admits to auditory and visual hallucinations which she attributes to her cocaine use.  She notes she has not slept in 1.5 weeks.  Review of Systems  Positive: hallucinations Negative: CP  Physical Exam  BP (!) 123/111 (BP Location: Right Arm)   Pulse (!) 109   Temp 98.4 F (36.9 C) (Oral)   Resp 20   Ht 5\' 6"  (1.676 m)   Wt 49.9 kg   SpO2 100%   BMI 17.75 kg/m  Gen:   Awake, no distress   Resp:  Normal effort  MSK:   Moves extremities without difficulty  Other:    Medical Decision Making  Medically screening exam initiated at 6:28 PM.  Appropriate orders placed.  [name] was informed that the remainder of the evaluation will be completed by another provider, this initial triage assessment does not replace that evaluation, and the importance of remaining in the ED until their evaluation is complete.  Medical clearance labs ordered EKG due to tachycardia   [name] 11/06/20 1829    11/08/20, MD 11/06/20 2044

## 2020-11-07 DIAGNOSIS — F1910 Other psychoactive substance abuse, uncomplicated: Secondary | ICD-10-CM | POA: Diagnosis not present

## 2020-11-07 LAB — RAPID URINE DRUG SCREEN, HOSP PERFORMED
Amphetamines: NOT DETECTED
Barbiturates: NOT DETECTED
Benzodiazepines: NOT DETECTED
Cocaine: POSITIVE — AB
Opiates: NOT DETECTED
Tetrahydrocannabinol: NOT DETECTED

## 2020-11-07 NOTE — Discharge Instructions (Signed)
1. Medications: usual home medications 2. Treatment: rest, drink plenty of fluids,  3. Follow Up: Please followup with your primary doctor in 2-3 days for discussion of your diagnoses and further evaluation after today's visit; if you do not have a primary care doctor use the resource guide provided to find one; Please return to the ER for new or worsening symptoms  
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
## 2021-05-01 ENCOUNTER — Other Ambulatory Visit: Payer: Self-pay

## 2021-05-01 ENCOUNTER — Encounter (HOSPITAL_COMMUNITY): Payer: Self-pay

## 2021-05-01 ENCOUNTER — Emergency Department (HOSPITAL_COMMUNITY)
Admission: EM | Admit: 2021-05-01 | Discharge: 2021-05-02 | Disposition: A | Discharge status: Home / Self Care | Payer: Medicare Other | Attending: Emergency Medicine | Admitting: Emergency Medicine

## 2021-05-01 DIAGNOSIS — Z046 Encounter for general psychiatric examination, requested by authority: Secondary | ICD-10-CM | POA: Diagnosis present

## 2021-05-01 DIAGNOSIS — F1910 Other psychoactive substance abuse, uncomplicated: Secondary | ICD-10-CM | POA: Diagnosis not present

## 2021-05-01 DIAGNOSIS — R45851 Suicidal ideations: Secondary | ICD-10-CM | POA: Insufficient documentation

## 2021-05-01 DIAGNOSIS — Y900 Blood alcohol level of less than 20 mg/100 ml: Secondary | ICD-10-CM | POA: Diagnosis not present

## 2021-05-01 DIAGNOSIS — Z20822 Contact with and (suspected) exposure to covid-19: Secondary | ICD-10-CM | POA: Diagnosis not present

## 2021-05-01 DIAGNOSIS — R5383 Other fatigue: Secondary | ICD-10-CM | POA: Diagnosis not present

## 2021-05-01 DIAGNOSIS — F1190 Opioid use, unspecified, uncomplicated: Secondary | ICD-10-CM | POA: Diagnosis present

## 2021-05-01 LAB — CBC
HCT: 40.6 % (ref 36.0–46.0)
Hemoglobin: 14.2 g/dL (ref 12.0–15.0)
MCH: 29.8 pg (ref 26.0–34.0)
MCHC: 35 g/dL (ref 30.0–36.0)
MCV: 85.3 fL (ref 80.0–100.0)
Platelets: 254 10*3/uL (ref 150–400)
RBC: 4.76 MIL/uL (ref 3.87–5.11)
RDW: 12.8 % (ref 11.5–15.5)
WBC: 7.8 10*3/uL (ref 4.0–10.5)
nRBC: 0 % (ref 0.0–0.2)

## 2021-05-01 LAB — COMPREHENSIVE METABOLIC PANEL
ALT: 29 U/L (ref 0–44)
AST: 28 U/L (ref 15–41)
Albumin: 4.3 g/dL (ref 3.5–5.0)
Alkaline Phosphatase: 58 U/L (ref 38–126)
Anion gap: 7 (ref 5–15)
BUN: 13 mg/dL (ref 6–20)
CO2: 24 mmol/L (ref 22–32)
Calcium: 9.4 mg/dL (ref 8.9–10.3)
Chloride: 105 mmol/L (ref 98–111)
Creatinine, Ser: 0.84 mg/dL (ref 0.44–1.00)
GFR, Estimated: >60 mL/min (ref >60)
Glucose, Bld: 111 mg/dL — ABNORMAL HIGH (ref 70–99)
Potassium: 3.8 mmol/L (ref 3.5–5.1)
Sodium: 136 mmol/L (ref 135–145)
Total Bilirubin: 1 mg/dL (ref 0.3–1.2)
Total Protein: 7.7 g/dL (ref 6.5–8.1)

## 2021-05-01 LAB — I-STAT BETA HCG BLOOD, ED (MC, WL, AP ONLY): I-stat hCG, quantitative: <5 m[IU]/mL (ref <5)

## 2021-05-01 LAB — ACETAMINOPHEN LEVEL: Acetaminophen (Tylenol), Serum: <10 ug/mL — ABNORMAL LOW (ref 10–30)

## 2021-05-01 LAB — CBG MONITORING, ED: Glucose-Capillary: 93 mg/dL (ref 70–99)

## 2021-05-01 LAB — RESP PANEL BY RT-PCR (FLU A&B, COVID) ARPGX2
Influenza A by PCR: NEGATIVE
Influenza B by PCR: NEGATIVE
SARS Coronavirus 2 by RT PCR: NEGATIVE

## 2021-05-01 LAB — SALICYLATE LEVEL: Salicylate Lvl: <7 mg/dL — ABNORMAL LOW (ref 7.0–30.0)

## 2021-05-01 LAB — ETHANOL: Alcohol, Ethyl (B): <10 mg/dL (ref <10)

## 2021-05-01 MED ORDER — ONDANSETRON HCL 4 MG PO TABS: 4.0000 mg | ORAL_TABLET | Freq: Three times a day (TID) | ORAL | Status: DC | PRN

## 2021-05-01 MED ORDER — NICOTINE 21 MG/24HR TD PT24
21.0000 mg | MEDICATED_PATCH | Freq: Every day | TRANSDERMAL | Status: DC
  Administered 2021-05-01: 21 mg via TRANSDERMAL
  Filled 2021-05-01: qty 1

## 2021-05-01 MED ORDER — ALUM & MAG HYDROXIDE-SIMETH 200-200-20 MG/5ML PO SUSP: 30.0000 mL | Freq: Four times a day (QID) | ORAL | Status: DC | PRN

## 2021-05-01 NOTE — ED Notes (Signed)
Pt currently calm and cooperative with staff, polite. ?

## 2021-05-01 NOTE — ED Provider Notes (Signed)
?[facility]-EMERGENCY DEPT ?Provider Note ? ? ?CSN: [id number] ?Arrival date & time: 05/01/21  1257 ? ?  ? ?History ? ?Chief Complaint  ?Patient presents with  ? IVC  ? Suicidal  ? ? ?[name] is a 41 y.o. female. ? ?HPI ?41 year old female presents in police custody with involuntary commitment papers.  Papers were filled out by mom and reports that the patient abuses drugs, has threatened to kill her self, and generally has poor hygiene. ? ?Patient does admit to using cocaine and daily alcohol (1-2 40 ounce beers per day).  However she denies any suicidal thoughts.  She states she feels generally fatigued and has not eaten well over the last couple days.  She states that her mom filled out these papers to get vengeance on her after she would give her money for gambling.  She denies any acute illness besides fatigue from poor p.o. intake. ? ?Home Medications ?Prior to Admission medications   ?Not on File  ?   ? ?Allergies    ?Cephalexin   ? ?Review of Systems   ?Review of Systems  ?Constitutional:  Positive for fatigue. Negative for fever.  ?Neurological:  Negative for headaches.  ?Psychiatric/Behavioral:  Negative for suicidal ideas.   ? ?Physical Exam ?Updated Vital Signs ?BP (!) 141/120 (BP Location: Left Arm)   Pulse 96   Temp 98.1 ?F (36.7 ?C) (Oral)   Resp 16   Ht 5\' 6"  (1.676 m)   Wt 59 kg   LMP 04/24/2021   SpO2 94%   BMI 20.98 kg/m?  ?Physical Exam ?Vitals and nursing note reviewed.  ?Constitutional:   ?   Appearance: She is well-developed. She is not ill-appearing or diaphoretic.  ?HENT:  ?   Head: Normocephalic and atraumatic.  ?Cardiovascular:  ?   Rate and Rhythm: Normal rate and regular rhythm.  ?   Heart sounds: Normal heart sounds.  ?Pulmonary:  ?   Effort: Pulmonary effort is normal.  ?   Breath sounds: Normal breath sounds.  ?Abdominal:  ?   General: There is no distension.  ?Skin: ?   General: Skin is warm and dry.  ?Neurological:  ?   Mental Status: She is alert and  oriented to person, place, and time.  ?Psychiatric:     ?   Thought Content: Thought content does not include suicidal ideation.  ? ? ?ED Results / Procedures / Treatments   ?Labs ?(all labs ordered are listed, but only abnormal results are displayed) ?Labs Reviewed  ?COMPREHENSIVE METABOLIC PANEL - Abnormal; Notable for the following components:  ?    Result Value  ? Glucose, Bld 111 (*)   ? All other components within normal limits  ?SALICYLATE LEVEL - Abnormal; Notable for the following components:  ? Salicylate Lvl <7.0 (*)   ? All other components within normal limits  ?ACETAMINOPHEN LEVEL - Abnormal; Notable for the following components:  ? Acetaminophen (Tylenol), Serum <10 (*)   ? All other components within normal limits  ?RESP PANEL BY RT-PCR (FLU A&B, COVID) ARPGX2  ?ETHANOL  ?CBC  ?RAPID URINE DRUG SCREEN, HOSP PERFORMED  ?I-STAT BETA HCG BLOOD, ED ([facility], [facility], [facility] ONLY)  ?CBG MONITORING, ED  ? ? ?EKG ?None ? ?Radiology ?No results found. ? ?Procedures ?Procedures  ? ? ?Medications Ordered in ED ?Medications  ?nicotine (NICODERM CQ - dosed in mg/24 hours) patch 21 mg (21 mg Transdermal Patch Applied 05/01/21 1442)  ?alum & mag hydroxide-simeth (MAALOX/MYLANTA) 200-200-20 MG/5ML suspension 30 mL (  has no administration in time range)  ?ondansetron (ZOFRAN) tablet 4 mg (has no administration in time range)  ? ? ?ED Course/ Medical Decision Making/ A&P ?Clinical Course as of 05/01/21 1603  ?Tue May 01, 2021  ?1512 I called mom, [name], for more information.  She reports that the patient has been abusing drugs for quite some time.  She has been threatening to kill her self, sometimes by overdose.  Came to ahead yesterday and mom involuntarily committed her due to her concern for the patient's safety and that she will kill herself either accidentally or intentionally.  Patient has made statements that she will kill herself if she does not get drugs. [SG]  ?  ?Clinical Course User Index ?[SG] Pricilla Loveless,  MD  ? ?                        ?Medical Decision Making ?Amount and/or Complexity of Data Reviewed ?External Data Reviewed: notes. ?Labs: ordered. ? ?Risk ?OTC drugs. ?Prescription drug management. ? ? ?Patient presents under involuntary commitment.  She denies being SI though her mom provides a different story.  Given this with her history of polysubstance abuse I think is reasonable to get psychiatry involved.  Will consult TTS.  Medically she seems stable for psychiatric disposition and her labs have been reviewed and besides a glucose of 111 they are normal. First exam filled out. ? ? ? ? ? ? ? ?Final Clinical Impression(s) / ED Diagnoses ?Final diagnoses:  ?Polysubstance abuse (HCC)  ? ? ?Rx / DC Orders ?ED Discharge Orders   ? ? None  ? ?  ? ? ?  ?Pricilla Loveless, MD ?05/01/21 1604 ? ?

## 2021-05-01 NOTE — ED Notes (Addendum)
X1 Black "[place]" Bookbag, Clothing, Shoes, were placed in belonging bags and labeled with Hospital ID Labels. Pt's necklace was placed in specimen cup and labeled with hospital ID and placed in belonging bags. Placed in triage belonging cabinet.  ?

## 2021-05-01 NOTE — ED Notes (Signed)
Call received from pt mother [name] [phone] requesting rtn call for pt status/updates. ENMiles ?

## 2021-05-01 NOTE — ED Triage Notes (Signed)
Patient denies having suicidal  thoughts. Patient states she does ectasy, marijuana, heroin, and cocaine. ?Patient states she snorted cocaine 2 days ago. Patient states there is heroin in the ectasy pills that she takes. ?

## 2021-05-01 NOTE — ED Triage Notes (Signed)
Patient was brought in by [employer]. Patient's mother took out IVC papers which state- Respondent has been diagnosed with bipolar. She is currently on heroin, crack,, cocaine, and Xanax. Respondent has threatened to harm herself. Respondent isn't eating or tending to personal hygiene nor is she sleeping. ?

## 2021-05-01 NOTE — ED Notes (Signed)
Pt refused to have covid/flu test performed, stating "I got a covid test done last week, I'm good." ?

## 2021-05-01 NOTE — ED Notes (Addendum)
Pt was informed of [facility] Policy regarding dressing out into hospital attire, all belongings to be placed into belonging bags and labeled with pt ID. Pt acknowledged these instructions I provided. Pt had no further questions or concerns at this time.  

## 2021-05-01 NOTE — ED Notes (Signed)
Pt provided x1 ham and x1 [place] sandwich. ?

## 2021-05-01 NOTE — ED Notes (Signed)
Call received from pt mother Kenzly Kesten [phone] requesting rtn call for pt status/updates. Spoke w/pt at bedside-  per pt request no health/visit info is to be discussed w/mother. Advised RN and pt mother of pt's wishes. ENMiles ?

## 2021-05-01 NOTE — ED Notes (Signed)
Pt denies any pain at this time.

## 2021-05-02 DIAGNOSIS — F1910 Other psychoactive substance abuse, uncomplicated: Secondary | ICD-10-CM | POA: Diagnosis not present

## 2021-05-02 LAB — RAPID URINE DRUG SCREEN, HOSP PERFORMED
Amphetamines: POSITIVE — AB
Barbiturates: NOT DETECTED
Benzodiazepines: NOT DETECTED
Cocaine: POSITIVE — AB
Opiates: POSITIVE — AB
Tetrahydrocannabinol: NOT DETECTED

## 2021-05-02 MED ORDER — LORAZEPAM 1 MG PO TABS: 1.0000 mg | ORAL_TABLET | ORAL | Status: DC | PRN

## 2021-05-02 MED ORDER — ZIPRASIDONE MESYLATE 20 MG IM SOLR: 20.0000 mg | INTRAMUSCULAR | Status: DC | PRN

## 2021-05-02 MED ORDER — OLANZAPINE 10 MG PO TBDP
10.0000 mg | ORAL_TABLET | Freq: Three times a day (TID) | ORAL | Status: DC | PRN
  Filled 2021-05-02: qty 1

## 2021-05-02 NOTE — ED Notes (Signed)
Pt becoming increasingly agitated, and verbally abusive and saying "I will make a storm up in here." ?

## 2021-05-02 NOTE — ED Notes (Signed)
Patient is requesting that no one speak with her mother at this time.  Mother was in the lobby this morning at 0815. She has been told that at this time, her daughter does not wish to speak with her, see her, or have anyone provide her with updates. Mother is aware that the patient is an adult and unless she has legal court appointed guardian, that means we cannot talk to her about the patient. She says she is the legal guardian but is unable to provide paperwork so we cannot trust this.  While she is frustrated, she got the point and left the building. I also let her know that she cannot continue to call up here demanding to speak with nurses and providers, then hanging up up on staff when she is told she cannot. ?

## 2021-05-02 NOTE — ED Notes (Signed)
MD made aware that pt is refusing PO medication at this time. Pt is calm and resting at this time and occasionally mumbles to herself. Per MD if patient becomes more agitated and tries to leave then we will give IM geodon.  ?

## 2021-05-02 NOTE — ED Notes (Signed)
This nurse assigned to patient. Report from previous nurse. Patient has been agitated and uncooperative earlier per previous nurse. Patient is resting with her eyes closed, no complaints or issues. No agitation at this time. JRPRN ?

## 2021-05-02 NOTE — ED Notes (Signed)
IVC papers rescinded. Pt is discharged. Pt verbalized understanding of discharge instructions. Pt given back all belongings and changed back into her regular scrubs.  ? ?Offered pt a buss pass. Pt very disrespectful to this RN. Pt reports she needs a cab voucher.  ? ? ?Social worker called. Cab voucher given to patient. [facility] has been dispatched.  ?

## 2021-05-02 NOTE — BH Assessment (Signed)
[facility] Assessment Progress Note ? ?Per Hillery Jacks, NP, this pt does not require psychiatric hospitalization at this time.  Pt presents under IVC initiated by Bernerd Pho and upheld by EDP, which has been rescinded by EDP Marianna Fuss, MD.  Pt is psychiatrically cleared.  Discharge instructions include referral information for area substance use disorder treatment providers.  Dr Stevie Kern and pt's nurse, xxx, have been notified. ? ?Doylene Canning, MA ?Triage Specialist ?[phone] ? ?
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
## 2021-05-02 NOTE — Discharge Instructions (Signed)
To help you maintain a sober lifestyle, a substance use disorder treatment program may be beneficial to you.  Contact one of the following providers at your earliest opportunity to ask about enrolling in their program: ? ?MEDICAL DETOX AND RESIDENTIAL REHAB: ? ?     [facility] ?     [address]  ?     [place], [place] [address]  ?     [phone]  ? ?CHEMICAL DEPENDENCY INTENSIVE OUTPATIENT PROGRAMS: ? ?     [facility] ?     [address] ?     [place], [place] [address] ?     Contact person: Myrna Blazer, LCSW ?     [phone] ? ?     [facility] ?     [address] ?     [place], [place] [address] ?     [phone]  ?

## 2021-05-02 NOTE — ED Notes (Signed)
Patient sleeping after being agitated since she got pulled into a hall bed. Will reassess VS when able. Rise and fall of chest noted.  ?

## 2021-05-02 NOTE — ED Provider Notes (Signed)
Emergency Medicine Observation Re-evaluation Note ? ?[name] is a 41 y.o. female, seen on rounds today.  Pt initially presented to the ED for complaints of IVC and Suicidal ?Currently, the patient is resting. ? ?Physical Exam  ?BP 116/88 (BP Location: Right Arm)   Pulse 71   Temp (!) 97.5 ?F (36.4 ?C) (Oral)   Resp 16   Ht 5\' 6"  (1.676 m)   Wt 59 kg   LMP 04/24/2021   SpO2 96%   BMI 20.98 kg/m?  ?Physical Exam ?General: calm ?Cardiac: warm andw ell perfused ?Lungs: even unlabored ?Psych: calm ? ?ED Course / MDM  ?EKG:  ? ?I have reviewed the labs performed to date as well as medications administered while in observation.  Recent changes in the last 24 hours include was initially calm this morning but notified by RN pt increasingly anxious, agitated. ? ?Plan  ?Current plan is for eval by TTS ?Will ask TTS to make sure pt is evaluated, order has been in place since yesterday but no notes from psych ?Orlena Othman is under involuntary commitment. ?  ? ?  ?Lucrezia Starch, MD ?05/02/21 [phone] ? ?

## 2021-05-02 NOTE — ED Notes (Signed)
Call received from pt mother Jason Hauge [phone] requesting info re: pt status. Advised caller per pt request no info is to be released about pt health status. Pt mother became irate and demanding info on pt. Explained no information can be released per pt request. Pt mother disconnected call. Call returned from pt mother- call answered, but no response when introduction given. ENMiles ?

## 2021-05-02 NOTE — ED Notes (Signed)
This RN walked patient to cab.  ?

## 2021-06-24 ENCOUNTER — Emergency Department (HOSPITAL_COMMUNITY)
Admission: EM | Admit: 2021-06-24 | Discharge: 2021-06-25 | Discharge status: Against Medical Advice | Payer: Medicare Other | Source: Home / Self Care

## 2021-06-24 ENCOUNTER — Other Ambulatory Visit: Payer: Self-pay

## 2021-06-24 ENCOUNTER — Encounter (HOSPITAL_COMMUNITY): Payer: Self-pay

## 2021-06-24 DIAGNOSIS — S30860A Insect bite (nonvenomous) of lower back and pelvis, initial encounter: Secondary | ICD-10-CM | POA: Insufficient documentation

## 2021-06-24 DIAGNOSIS — L0231 Cutaneous abscess of buttock: Secondary | ICD-10-CM | POA: Diagnosis not present

## 2021-06-24 DIAGNOSIS — Z5321 Procedure and treatment not carried out due to patient leaving prior to being seen by health care provider: Secondary | ICD-10-CM | POA: Insufficient documentation

## 2021-06-24 DIAGNOSIS — W57XXXA Bitten or stung by nonvenomous insect and other nonvenomous arthropods, initial encounter: Secondary | ICD-10-CM | POA: Insufficient documentation

## 2021-06-24 NOTE — ED Triage Notes (Signed)
Pt presents to ED from home, states x 1 week ago she was sitting on a park bench and felt a bite on her right butt cheek. Pt states 2 days ago she pulled out something black from the bite site and since had experience increased swelling and redness in the area. Pt febrile and tachycardic upon triage.

## 2021-06-24 NOTE — ED Provider Triage Note (Signed)
Emergency Medicine Provider Triage Evaluation Note  [name] , a 41 y.o. female  was evaluated in triage.  Pt complains of who presents to the emergency department department complaining of insect bite onset 1 week.  She notes 2 days ago she pulled something out of the buttocks.  She presents to the emergency department today due to increased redness and pain to the area. Notes IV drug use of heroin which she injected into her arm with her last use being 2 days ago.   Review of Systems  Positive: As per HPI above Negative:   Physical Exam  BP 126/82 (BP Location: Right Arm)   Pulse (!) 148   Temp (!) 100.4 F (38 C) (Oral)   Resp 18   Ht 5\' 6"  (1.676 m)   Wt 59 kg   SpO2 99%   BMI 20.98 kg/m  Gen:   Awake, no distress  Resp:  Normal effort  MSK:   Moves extremities without difficulty  Other:  Area of induration and cellulitis noted to right gluteus.   Medical Decision Making  Medically screening exam initiated at 9:16 PM.  Appropriate orders placed.  Robie Oats was informed that the remainder of the evaluation will be completed by another provider, this initial triage assessment does not replace that evaluation, and the importance of remaining in the ED until their evaluation is complete.  9:22 PM - Discussed with RN that patient is in need of a room immediately. RN aware and working on room placement. Work-up initiated   Deshawnda Acrey A, PA-C 06/24/21 2126

## 2021-06-25 ENCOUNTER — Emergency Department (HOSPITAL_COMMUNITY): Payer: Medicare Other

## 2021-06-25 ENCOUNTER — Other Ambulatory Visit: Payer: Self-pay

## 2021-06-25 ENCOUNTER — Encounter (HOSPITAL_COMMUNITY): Payer: Self-pay

## 2021-06-25 ENCOUNTER — Inpatient Hospital Stay (HOSPITAL_COMMUNITY)
Admission: EM | Admit: 2021-06-25 | Discharge: 2021-06-29 | DRG: 571 | Disposition: A | Discharge status: Home / Self Care | Payer: Medicare Other | Attending: Internal Medicine | Admitting: Internal Medicine

## 2021-06-25 DIAGNOSIS — F418 Other specified anxiety disorders: Secondary | ICD-10-CM | POA: Diagnosis not present

## 2021-06-25 DIAGNOSIS — Z5900 Homelessness unspecified: Secondary | ICD-10-CM

## 2021-06-25 DIAGNOSIS — F17210 Nicotine dependence, cigarettes, uncomplicated: Secondary | ICD-10-CM | POA: Diagnosis present

## 2021-06-25 DIAGNOSIS — F4310 Post-traumatic stress disorder, unspecified: Secondary | ICD-10-CM | POA: Diagnosis present

## 2021-06-25 DIAGNOSIS — L0231 Cutaneous abscess of buttock: Principal | ICD-10-CM | POA: Diagnosis present

## 2021-06-25 DIAGNOSIS — F1910 Other psychoactive substance abuse, uncomplicated: Secondary | ICD-10-CM

## 2021-06-25 DIAGNOSIS — F319 Bipolar disorder, unspecified: Secondary | ICD-10-CM | POA: Diagnosis present

## 2021-06-25 DIAGNOSIS — F209 Schizophrenia, unspecified: Secondary | ICD-10-CM | POA: Diagnosis present

## 2021-06-25 DIAGNOSIS — F1110 Opioid abuse, uncomplicated: Secondary | ICD-10-CM | POA: Diagnosis present

## 2021-06-25 DIAGNOSIS — Z833 Family history of diabetes mellitus: Secondary | ICD-10-CM | POA: Diagnosis not present

## 2021-06-25 DIAGNOSIS — E119 Type 2 diabetes mellitus without complications: Secondary | ICD-10-CM | POA: Diagnosis present

## 2021-06-25 DIAGNOSIS — B9562 Methicillin resistant Staphylococcus aureus infection as the cause of diseases classified elsewhere: Secondary | ICD-10-CM | POA: Diagnosis present

## 2021-06-25 DIAGNOSIS — Z20822 Contact with and (suspected) exposure to covid-19: Secondary | ICD-10-CM | POA: Diagnosis present

## 2021-06-25 DIAGNOSIS — Z881 Allergy status to other antibiotic agents status: Secondary | ICD-10-CM | POA: Diagnosis not present

## 2021-06-25 DIAGNOSIS — F419 Anxiety disorder, unspecified: Secondary | ICD-10-CM | POA: Diagnosis present

## 2021-06-25 DIAGNOSIS — F1510 Other stimulant abuse, uncomplicated: Secondary | ICD-10-CM | POA: Diagnosis present

## 2021-06-25 DIAGNOSIS — E871 Hypo-osmolality and hyponatremia: Secondary | ICD-10-CM | POA: Diagnosis present

## 2021-06-25 DIAGNOSIS — L03317 Cellulitis of buttock: Secondary | ICD-10-CM | POA: Diagnosis present

## 2021-06-25 DIAGNOSIS — A4902 Methicillin resistant Staphylococcus aureus infection, unspecified site: Secondary | ICD-10-CM

## 2021-06-25 DIAGNOSIS — A419 Sepsis, unspecified organism: Secondary | ICD-10-CM | POA: Diagnosis present

## 2021-06-25 DIAGNOSIS — Z720 Tobacco use: Secondary | ICD-10-CM | POA: Diagnosis present

## 2021-06-25 DIAGNOSIS — F1410 Cocaine abuse, uncomplicated: Secondary | ICD-10-CM | POA: Diagnosis present

## 2021-06-25 DIAGNOSIS — M797 Fibromyalgia: Secondary | ICD-10-CM | POA: Diagnosis not present

## 2021-06-25 DIAGNOSIS — F10139 Alcohol abuse with withdrawal, unspecified: Secondary | ICD-10-CM | POA: Diagnosis not present

## 2021-06-25 DIAGNOSIS — F1190 Opioid use, unspecified, uncomplicated: Secondary | ICD-10-CM | POA: Diagnosis present

## 2021-06-25 LAB — CBC WITH DIFFERENTIAL/PLATELET
Abs Immature Granulocytes: 0.17 10*3/uL — ABNORMAL HIGH (ref 0.00–0.07)
Basophils Absolute: 0 10*3/uL (ref 0.0–0.1)
Basophils Relative: 0 %
Eosinophils Absolute: 0 10*3/uL (ref 0.0–0.5)
Eosinophils Relative: 0 %
HCT: 36.5 % (ref 36.0–46.0)
Hemoglobin: 12.8 g/dL (ref 12.0–15.0)
Immature Granulocytes: 1 %
Lymphocytes Relative: 8 %
Lymphs Abs: 1.7 10*3/uL (ref 0.7–4.0)
MCH: 30.3 pg (ref 26.0–34.0)
MCHC: 35.1 g/dL (ref 30.0–36.0)
MCV: 86.3 fL (ref 80.0–100.0)
Monocytes Absolute: 1.4 10*3/uL — ABNORMAL HIGH (ref 0.1–1.0)
Monocytes Relative: 6 %
Neutro Abs: 18 10*3/uL — ABNORMAL HIGH (ref 1.7–7.7)
Neutrophils Relative %: 85 %
Platelets: 250 10*3/uL (ref 150–400)
RBC: 4.23 MIL/uL (ref 3.87–5.11)
RDW: 12.8 % (ref 11.5–15.5)
WBC: 21.3 10*3/uL — ABNORMAL HIGH (ref 4.0–10.5)
nRBC: 0 % (ref 0.0–0.2)

## 2021-06-25 LAB — I-STAT CHEM 8, ED
BUN: 7 mg/dL (ref 6–20)
Calcium, Ion: 1.08 mmol/L — ABNORMAL LOW (ref 1.15–1.40)
Chloride: 94 mmol/L — ABNORMAL LOW (ref 98–111)
Creatinine, Ser: 0.9 mg/dL (ref 0.44–1.00)
Glucose, Bld: 105 mg/dL — ABNORMAL HIGH (ref 70–99)
HCT: 39 % (ref 36.0–46.0)
Hemoglobin: 13.3 g/dL (ref 12.0–15.0)
Potassium: 3.7 mmol/L (ref 3.5–5.1)
Sodium: 130 mmol/L — ABNORMAL LOW (ref 135–145)
TCO2: 23 mmol/L (ref 22–32)

## 2021-06-25 LAB — COMPREHENSIVE METABOLIC PANEL
ALT: 13 U/L (ref 0–44)
AST: 18 U/L (ref 15–41)
Albumin: 3.7 g/dL (ref 3.5–5.0)
Alkaline Phosphatase: 62 U/L (ref 38–126)
Anion gap: 11 (ref 5–15)
BUN: 8 mg/dL (ref 6–20)
CO2: 23 mmol/L (ref 22–32)
Calcium: 9 mg/dL (ref 8.9–10.3)
Chloride: 95 mmol/L — ABNORMAL LOW (ref 98–111)
Creatinine, Ser: 0.98 mg/dL (ref 0.44–1.00)
GFR, Estimated: >60 mL/min (ref >60)
Glucose, Bld: 106 mg/dL — ABNORMAL HIGH (ref 70–99)
Potassium: 3.7 mmol/L (ref 3.5–5.1)
Sodium: 129 mmol/L — ABNORMAL LOW (ref 135–145)
Total Bilirubin: 1.6 mg/dL — ABNORMAL HIGH (ref 0.3–1.2)
Total Protein: 7.5 g/dL (ref 6.5–8.1)

## 2021-06-25 LAB — PROTIME-INR
INR: 1.1 (ref 0.8–1.2)
Prothrombin Time: 14 seconds (ref 11.4–15.2)

## 2021-06-25 LAB — HIV ANTIBODY (ROUTINE TESTING W REFLEX): HIV Screen 4th Generation wRfx: NONREACTIVE

## 2021-06-25 LAB — APTT: aPTT: 31 seconds (ref 24–36)

## 2021-06-25 LAB — LACTIC ACID, PLASMA: Lactic Acid, Venous: 1.3 mmol/L (ref 0.5–1.9)

## 2021-06-25 LAB — I-STAT BETA HCG BLOOD, ED (MC, WL, AP ONLY): I-stat hCG, quantitative: <5 m[IU]/mL (ref <5)

## 2021-06-25 MED ORDER — DICYCLOMINE HCL 20 MG PO TABS: 20.0000 mg | ORAL_TABLET | Freq: Four times a day (QID) | ORAL | Status: DC | PRN

## 2021-06-25 MED ORDER — NICOTINE 14 MG/24HR TD PT24
14.0000 mg | MEDICATED_PATCH | Freq: Every day | TRANSDERMAL | Status: DC
  Administered 2021-06-25 – 2021-06-29 (×4): 14 mg via TRANSDERMAL
  Filled 2021-06-25 (×4): qty 1

## 2021-06-25 MED ORDER — LORAZEPAM 2 MG/ML IJ SOLN
1.0000 mg | Freq: Once | INTRAMUSCULAR | Status: AC
  Administered 2021-06-25: 1 mg via INTRAVENOUS
  Filled 2021-06-25: qty 1

## 2021-06-25 MED ORDER — ONDANSETRON HCL 4 MG/2ML IJ SOLN
4.0000 mg | Freq: Once | INTRAMUSCULAR | Status: DC
  Filled 2021-06-25: qty 2

## 2021-06-25 MED ORDER — HYDROMORPHONE HCL 1 MG/ML IJ SOLN
1.0000 mg | Freq: Once | INTRAMUSCULAR | Status: DC
  Filled 2021-06-25: qty 1

## 2021-06-25 MED ORDER — THIAMINE HCL 100 MG/ML IJ SOLN: 100.0000 mg | Freq: Every day | INTRAMUSCULAR | Status: DC

## 2021-06-25 MED ORDER — VANCOMYCIN HCL IN DEXTROSE 1-5 GM/200ML-% IV SOLN
1000.0000 mg | Freq: Once | INTRAVENOUS | Status: DC
  Filled 2021-06-25: qty 200

## 2021-06-25 MED ORDER — LEVOFLOXACIN IN D5W 750 MG/150ML IV SOLN: 750.0000 mg | INTRAVENOUS | Status: DC

## 2021-06-25 MED ORDER — LEVOFLOXACIN IN D5W 750 MG/150ML IV SOLN
750.0000 mg | Freq: Once | INTRAVENOUS | Status: AC
  Administered 2021-06-25: 750 mg via INTRAVENOUS
  Filled 2021-06-25: qty 150

## 2021-06-25 MED ORDER — LOPERAMIDE HCL 2 MG PO CAPS: 2.0000 mg | ORAL_CAPSULE | ORAL | Status: DC | PRN

## 2021-06-25 MED ORDER — ONDANSETRON HCL 4 MG/2ML IJ SOLN: 4.0000 mg | Freq: Four times a day (QID) | INTRAMUSCULAR | Status: DC | PRN

## 2021-06-25 MED ORDER — POLYETHYLENE GLYCOL 3350 17 G PO PACK: 17.0000 g | PACK | Freq: Every day | ORAL | Status: DC | PRN

## 2021-06-25 MED ORDER — ALBUTEROL SULFATE (2.5 MG/3ML) 0.083% IN NEBU: 2.5000 mg | INHALATION_SOLUTION | RESPIRATORY_TRACT | Status: DC | PRN

## 2021-06-25 MED ORDER — HYDROXYZINE HCL 25 MG PO TABS
25.0000 mg | ORAL_TABLET | Freq: Four times a day (QID) | ORAL | Status: DC | PRN
  Administered 2021-06-28 – 2021-06-29 (×2): 25 mg via ORAL
  Filled 2021-06-25 (×2): qty 1

## 2021-06-25 MED ORDER — MORPHINE SULFATE (PF) 2 MG/ML IV SOLN
2.0000 mg | INTRAVENOUS | Status: DC | PRN
  Administered 2021-06-25 – 2021-06-26 (×2): 2 mg via INTRAVENOUS
  Filled 2021-06-25 (×2): qty 1

## 2021-06-25 MED ORDER — METHOCARBAMOL 500 MG PO TABS: 500.0000 mg | ORAL_TABLET | Freq: Three times a day (TID) | ORAL | Status: DC | PRN

## 2021-06-25 MED ORDER — SODIUM CHLORIDE 0.9% FLUSH
3.0000 mL | Freq: Two times a day (BID) | INTRAVENOUS | Status: DC
  Administered 2021-06-29: 3 mL via INTRAVENOUS

## 2021-06-25 MED ORDER — LIDOCAINE-EPINEPHRINE (PF) 2 %-1:200000 IJ SOLN
20.0000 mL | Freq: Once | INTRAMUSCULAR | Status: AC
  Administered 2021-06-25: 20 mL
  Filled 2021-06-25: qty 20

## 2021-06-25 MED ORDER — VANCOMYCIN HCL 750 MG/150ML IV SOLN
750.0000 mg | Freq: Two times a day (BID) | INTRAVENOUS | Status: DC
  Administered 2021-06-25 – 2021-06-27 (×4): 750 mg via INTRAVENOUS
  Filled 2021-06-25 (×5): qty 150

## 2021-06-25 MED ORDER — PIPERACILLIN-TAZOBACTAM 3.375 G IVPB
3.3750 g | Freq: Three times a day (TID) | INTRAVENOUS | Status: DC
  Administered 2021-06-25 – 2021-06-27 (×5): 3.375 g via INTRAVENOUS
  Filled 2021-06-25 (×5): qty 50

## 2021-06-25 MED ORDER — ADULT MULTIVITAMIN W/MINERALS CH
1.0000 | ORAL_TABLET | Freq: Every day | ORAL | Status: DC
  Administered 2021-06-28 – 2021-06-29 (×2): 1 via ORAL
  Filled 2021-06-25 (×3): qty 1

## 2021-06-25 MED ORDER — HYDRALAZINE HCL 20 MG/ML IJ SOLN: 5.0000 mg | INTRAMUSCULAR | Status: DC | PRN

## 2021-06-25 MED ORDER — THIAMINE HCL 100 MG PO TABS
100.0000 mg | ORAL_TABLET | Freq: Every day | ORAL | Status: DC
  Administered 2021-06-28 – 2021-06-29 (×2): 100 mg via ORAL
  Filled 2021-06-25 (×3): qty 1

## 2021-06-25 MED ORDER — ENOXAPARIN SODIUM 40 MG/0.4ML IJ SOSY
40.0000 mg | PREFILLED_SYRINGE | INTRAMUSCULAR | Status: DC
  Administered 2021-06-28 – 2021-06-29 (×2): 40 mg via SUBCUTANEOUS
  Filled 2021-06-25 (×2): qty 0.4

## 2021-06-25 MED ORDER — LACTATED RINGERS IV BOLUS (SEPSIS)
1000.0000 mL | Freq: Once | INTRAVENOUS | Status: AC
  Administered 2021-06-25: 1000 mL via INTRAVENOUS

## 2021-06-25 MED ORDER — CLONIDINE HCL 0.1 MG PO TABS
0.1000 mg | ORAL_TABLET | Freq: Four times a day (QID) | ORAL | Status: AC
  Administered 2021-06-25 – 2021-06-26 (×3): 0.1 mg via ORAL
  Filled 2021-06-25 (×4): qty 1

## 2021-06-25 MED ORDER — LORAZEPAM 2 MG/ML IJ SOLN
1.0000 mg | INTRAMUSCULAR | Status: AC | PRN
  Administered 2021-06-25 – 2021-06-27 (×6): 2 mg via INTRAVENOUS
  Filled 2021-06-25 (×6): qty 1

## 2021-06-25 MED ORDER — BISACODYL 5 MG PO TBEC: 5.0000 mg | DELAYED_RELEASE_TABLET | Freq: Every day | ORAL | Status: DC | PRN

## 2021-06-25 MED ORDER — ONDANSETRON HCL 4 MG PO TABS: 4.0000 mg | ORAL_TABLET | Freq: Four times a day (QID) | ORAL | Status: DC | PRN

## 2021-06-25 MED ORDER — LORAZEPAM 1 MG PO TABS: 1.0000 mg | ORAL_TABLET | ORAL | Status: AC | PRN

## 2021-06-25 MED ORDER — OXYCODONE HCL 5 MG PO TABS
5.0000 mg | ORAL_TABLET | ORAL | Status: DC | PRN
  Administered 2021-06-26: 5 mg via ORAL
  Filled 2021-06-25: qty 1

## 2021-06-25 MED ORDER — CLONIDINE HCL 0.1 MG PO TABS
0.1000 mg | ORAL_TABLET | ORAL | Status: AC
  Administered 2021-06-28 (×2): 0.1 mg via ORAL
  Filled 2021-06-25 (×3): qty 1

## 2021-06-25 MED ORDER — LACTATED RINGERS IV SOLN: INTRAVENOUS | Status: DC

## 2021-06-25 MED ORDER — VANCOMYCIN HCL 1250 MG/250ML IV SOLN
1250.0000 mg | Freq: Once | INTRAVENOUS | Status: AC
Start: 2021-06-25 — End: 2021-06-25
  Administered 2021-06-25: 1250 mg via INTRAVENOUS
  Filled 2021-06-25: qty 250

## 2021-06-25 MED ORDER — ACETAMINOPHEN 325 MG PO TABS
650.0000 mg | ORAL_TABLET | Freq: Four times a day (QID) | ORAL | Status: DC | PRN
Start: 2021-06-25 — End: 2021-06-26
  Administered 2021-06-25: 650 mg via ORAL
  Filled 2021-06-25: qty 2

## 2021-06-25 MED ORDER — FOLIC ACID 1 MG PO TABS
1.0000 mg | ORAL_TABLET | Freq: Every day | ORAL | Status: DC
  Administered 2021-06-28 – 2021-06-29 (×2): 1 mg via ORAL
  Filled 2021-06-25 (×3): qty 1

## 2021-06-25 MED ORDER — IOHEXOL 300 MG/ML  SOLN
80.0000 mL | Freq: Once | INTRAMUSCULAR | Status: AC | PRN
Start: 2021-06-25 — End: 2021-06-25
  Administered 2021-06-25: 80 mL via INTRAVENOUS

## 2021-06-25 MED ORDER — CLONIDINE HCL 0.1 MG PO TABS
0.1000 mg | ORAL_TABLET | Freq: Every day | ORAL | Status: DC
  Administered 2021-06-29: 0.1 mg via ORAL
  Filled 2021-06-25: qty 1

## 2021-06-25 MED ORDER — OXYCODONE HCL 5 MG PO TABS
10.0000 mg | ORAL_TABLET | Freq: Once | ORAL | Status: AC
  Administered 2021-06-25: 10 mg via ORAL
  Filled 2021-06-25: qty 2

## 2021-06-25 NOTE — ED Notes (Signed)
Patient transported to CT 

## 2021-06-25 NOTE — ED Notes (Signed)
Pt in room resting. Attempted to give tylenol. Pt upset that she had to wake up. Pt swallowed pills without difficulty. Pt upset that physician wanted to look at wound. Updated on plan of care.

## 2021-06-25 NOTE — ED Notes (Signed)
Sayana Salley mother [phone] requesting an update on the patient

## 2021-06-25 NOTE — ED Provider Notes (Signed)
[facility] EMERGENCY DEPARTMENT Provider Note   CSN: [id number] Arrival date & time: 06/25/21  0103     History  Chief Complaint  Patient presents with   Abscess    [name] is a 41 y.o. female.  The history is provided by the patient.  Abscess [name] is a 41 y.o. female who presents to the Emergency Department complaining of insect bite.  She presents to the emergency department complaining of a spider bite to her right buttocks that started 2 days ago.  Reports fever, nausea since yesterday.  No associated cough, dysuria.  She states that she may have pulled a thorn out of that area 2 days ago as well.  She also requests rehab for drug use.  She states uses all drugs including heroin and ice, last use was 2 days ago.  She does use IV drugs.  No SI.  Fever since yesterday.  Has hot flashes Has nausea.  Has upper abdominal pain.   Uses alcohol.   No medical problems.   No chance of pregnancy.      Home Medications Prior to Admission medications   Not on File      Allergies    Cephalexin    Review of Systems   Review of Systems  All other systems reviewed and are negative.   Physical Exam Updated Vital Signs BP 108/75   Pulse (!) 108   Temp 100.3 F (37.9 C)   Resp 17   Ht 5\' 6"  (1.676 m)   Wt 59 kg   SpO2 99%   BMI 20.98 kg/m  Physical Exam Vitals and nursing note reviewed.  Constitutional:      Appearance: She is well-developed.  HENT:     Head: Normocephalic and atraumatic.  Cardiovascular:     Rate and Rhythm: Regular rhythm. Tachycardia present.     Heart sounds: No murmur heard. Pulmonary:     Effort: Pulmonary effort is normal. No respiratory distress.     Breath sounds: Normal breath sounds.  Abdominal:     Palpations: Abdomen is soft.     Tenderness: There is no abdominal tenderness. There is no guarding or rebound.  Genitourinary:    Comments: Large area of erythema and induration involving the entire right  gluteal region. Musculoskeletal:        General: No tenderness.  Skin:    General: Skin is warm and dry.  Neurological:     Mental Status: She is alert and oriented to person, place, and time.  Psychiatric:        Behavior: Behavior normal.     ED Results / Procedures / Treatments   Labs (all labs ordered are listed, but only abnormal results are displayed) Labs Reviewed  COMPREHENSIVE METABOLIC PANEL - Abnormal; Notable for the following components:      Result Value   Sodium 129 (*)    Chloride 95 (*)    Glucose, Bld 106 (*)    Total Bilirubin 1.6 (*)    All other components within normal limits  CBC WITH DIFFERENTIAL/PLATELET - Abnormal; Notable for the following components:   WBC 21.3 (*)    Neutro Abs 18.0 (*)    Monocytes Absolute 1.4 (*)    Abs Immature Granulocytes 0.17 (*)    All other components within normal limits  I-STAT CHEM 8, ED - Abnormal; Notable for the following components:   Sodium 130 (*)    Chloride 94 (*)    Glucose, Bld 105 (*)  Calcium, Ion 1.08 (*)    All other components within normal limits  CULTURE, BLOOD (ROUTINE X 2)  CULTURE, BLOOD (ROUTINE X 2)  URINE CULTURE  RESP PANEL BY RT-PCR (FLU A&B, COVID) ARPGX2  AEROBIC/ANAEROBIC CULTURE W GRAM STAIN (SURGICAL/DEEP WOUND)  LACTIC ACID, PLASMA  PROTIME-INR  APTT  LACTIC ACID, PLASMA  URINALYSIS, ROUTINE W REFLEX MICROSCOPIC  I-STAT BETA HCG BLOOD, ED ([facility], [facility], [facility] ONLY)    EKG EKG Interpretation  Date/Time:  Monday June 25 2021 01:45:54 EDT Ventricular Rate:  123 PR Interval:  136 QRS Duration: 79 QT Interval:  287 QTC Calculation: 411 R Axis:   89 Text Interpretation: Sinus tachycardia Borderline T abnormalities, inferior leads Confirmed by Quintella Reichert [phone]) on 06/25/2021 2:13:00 AM  Radiology CT ABDOMEN PELVIS W CONTRAST  Result Date: 06/25/2021 CLINICAL DATA:  Sepsis.  Abscess in the buttock. EXAM: CT ABDOMEN AND PELVIS WITH CONTRAST TECHNIQUE: Multidetector CT imaging  of the abdomen and pelvis was performed using the standard protocol following bolus administration of intravenous contrast. RADIATION DOSE REDUCTION: This exam was performed according to the departmental dose-optimization program which includes automated exposure control, adjustment of the mA and/or kV according to patient size and/or use of iterative reconstruction technique. CONTRAST:  78mL OMNIPAQUE IOHEXOL 300 MG/ML  SOLN COMPARISON:  CT abdomen pelvis dated 02/02/2012. FINDINGS: Lower chest: The visualized lung bases are clear. No intra-abdominal free air or free fluid. Hepatobiliary: No focal liver abnormality is seen. No gallstones, gallbladder wall thickening, or biliary dilatation. Pancreas: Unremarkable. No pancreatic ductal dilatation or surrounding inflammatory changes. Spleen: Normal in size without focal abnormality. Adrenals/Urinary Tract: The adrenal glands unremarkable. The kidneys, visualized ureters, and urinary bladder appear unremarkable. Stomach/Bowel: There is no bowel obstruction or active inflammation. Normal appendix. Vascular/Lymphatic: The abdominal aorta and IVC are unremarkable. No portal venous gas. There is no adenopathy. Reproductive: The uterus is grossly unremarkable. No adnexal masses. Other: There is diffuse subcutaneous edema of the right gluteal region with slight thickening of the skin of the right buttock. Findings may represent cellulitis. Clinical correlation is recommended. No drainable fluid collection or abscess. No soft tissue air. Probable mildly reactive right inguinal lymph nodes. Musculoskeletal: No acute osseous pathology. IMPRESSION: Findings may represent cellulitis of the right buttock. No drainable fluid collection or abscess. Electronically Signed   By: Anner Crete M.D.   On: 06/25/2021 03:47   DG Chest Port 1 View  Result Date: 06/25/2021 CLINICAL DATA:  Questionable sepsis - evaluate for abnormality EXAM: PORTABLE CHEST 1 VIEW COMPARISON:   02/07/2012 FINDINGS: The heart size and mediastinal contours are within normal limits. Both lungs are clear. The visualized skeletal structures are unremarkable. IMPRESSION: No active disease. Electronically Signed   By: Rolm Baptise M.D.   On: 06/25/2021 02:00    Procedures .Marland KitchenIncision and Drainage  Date/Time: 06/25/2021 5:55 AM  Performed by: Quintella Reichert, MD Authorized by: Quintella Reichert, MD   Consent:    Consent obtained:  Verbal   Consent given by:  Patient   Risks discussed:  Bleeding, incomplete drainage, pain and damage to other organs   Alternatives discussed:  Alternative treatment, delayed treatment, observation and no treatment Universal protocol:    Patient identity confirmed:  Verbally with patient Location:    Type:  Abscess   Location:  Lower extremity   Lower extremity location:  Buttock   Buttock location:  R buttock Pre-procedure details:    Skin preparation:  Chlorhexidine Sedation:    Sedation type:  Anxiolysis Anesthesia:  Anesthesia method:  Local infiltration   Local anesthetic:  Lidocaine 2% WITH epi Procedure type:    Complexity:  Complex Procedure details:    Incision types:  Single straight   Wound management:  Probed and deloculated   Drainage:  Purulent and bloody   Drainage amount:  Moderate   Wound treatment:  Wound left open   Packing materials:  None Post-procedure details:    Procedure completion:  Tolerated    CRITICAL CARE Performed by: Quintella Reichert   Total critical care time: 35 minutes  Critical care time was exclusive of separately billable procedures and treating other patients.  Critical care was necessary to treat or prevent imminent or life-threatening deterioration.  Critical care was time spent personally by me on the following activities: development of treatment plan with patient and/or surrogate as well as nursing, discussions with consultants, evaluation of patient's response to treatment, examination of  patient, obtaining history from patient or surrogate, ordering and performing treatments and interventions, ordering and review of laboratory studies, ordering and review of radiographic studies, pulse oximetry and re-evaluation of patient's condition.  Medications Ordered in ED Medications  lactated ringers infusion (has no administration in time range)  lactated ringers bolus 1,000 mL (1,000 mLs Intravenous New Bag/Given 06/25/21 0533)    And  lactated ringers bolus 1,000 mL (has no administration in time range)  HYDROmorphone (DILAUDID) injection 1 mg (1 mg Intravenous Not Given 06/25/21 0221)  ondansetron (ZOFRAN) injection 4 mg (4 mg Intravenous Not Given 06/25/21 0222)  acetaminophen (TYLENOL) tablet 650 mg (has no administration in time range)  vancomycin (VANCOREADY) IVPB 1250 mg/250 mL (1,250 mg Intravenous New Bag/Given 06/25/21 0532)  levofloxacin (LEVAQUIN) IVPB 750 mg (0 mg Intravenous Stopped 06/25/21 0317)  oxyCODONE (Oxy IR/ROXICODONE) immediate release tablet 10 mg (10 mg Oral Given 06/25/21 0226)  iohexol (OMNIPAQUE) 300 MG/ML solution 80 mL (80 mLs Intravenous Contrast Given 06/25/21 0339)  lidocaine-EPINEPHrine (XYLOCAINE W/EPI) 2 %-1:200000 (PF) injection 20 mL (20 mLs Infiltration Given by Other 06/25/21 0523)  LORazepam (ATIVAN) injection 1 mg (1 mg Intravenous Given 06/25/21 [id number])    ED Course/ Medical Decision Making/ A&P                           Medical Decision Making Risk OTC drugs. Prescription drug management. Decision regarding hospitalization.   Patient with history of IV drug abuse here for evaluation of gluteal wound.  She has an extensive area of cellulitis to the right buttocks.  She is febrile, tachycardic on ED presentation.  She was started on IV fluid resuscitation as well as broad-spectrum antibiotics for sepsis.  Patient initially agreeable to IV antibiotics and then became very anxious and stated she did not want IV antibiotics and these had to be held  as patient refused any IV intervention.  Patient agreeable to CT scan to further evaluate complexity of her abscess.  CT scan with cellulitis, no drainable fluid collection.  When patient returned from [place] she did have spontaneous purulent drainage from a 1 cm ulcer to the central portion of her cellulitis.  Patient requests drainage of this area and feels that it will help her symptoms.  Discussed with risks and benefits of incision and drainage at the bedside and she is in agreement.  Discussed that this will likely need a repeat drainage at a future time.  She is also agreeable at this time to anxiety medication as well as IV antibiotics.  After  treatment with lorazepam for her anxiety patient significantly more comfortable and tolerated I&D well.  Small to moderate amount of purulent and bloody material returned from I&D.  Unable to pack the area due to patient comfort.  At this point in time patient is in agreement with admission for ongoing care.  Medicine consulted for admission for cellulitis with abscess as well as sepsis.        Final Clinical Impression(s) / ED Diagnoses Final diagnoses:  Abscess and cellulitis of gluteal region  Sepsis without acute organ dysfunction, due to unspecified organism [facility])  Polysubstance abuse [facility])    Rx / DC Orders ED Discharge Orders     None         Quintella Reichert, MD 06/25/21 0559
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
## 2021-06-25 NOTE — ED Notes (Signed)
Pt calling out stating "I don't want any intravenous medications going through me, take this out or I will". This RN attempted to deescalate, however, pt at this time disconnected herself from the IV antibiotics.

## 2021-06-25 NOTE — ED Notes (Signed)
Patient mother called to inquire about patient treatment plan but patient refused not to do so; mother informed

## 2021-06-25 NOTE — ED Notes (Signed)
This NT covered the wound in 2 4x4 gauzes and taped them down. Pt relaxed and went to sleep at this time.
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
## 2021-06-25 NOTE — ED Notes (Signed)
This RN attempted to get 2nd IV, pt stated she refuses to be stuck again. Pt then stated that she "doesn't want any med pushed in her" except for antibiotics and fluids. This RN was unable to obtain 2nd set of cultures d/t pt's request. EDP notified

## 2021-06-25 NOTE — ED Notes (Signed)
Pt being loud in room because of monitor. Pt unhooked and monitor turned off. Pt given bagged meal.

## 2021-06-25 NOTE — ED Triage Notes (Addendum)
Large red, swollen abscess to right buttock x 1 week. Was sitting on park bench and suspects a insect bite.    Eloped from [facility] ED pta. Was febrile and had hr > 140.
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
## 2021-06-25 NOTE — ED Notes (Signed)
Pt stated she will go back on IV antibiotics, this RN restarted the Levaquin.

## 2021-06-25 NOTE — ED Provider Triage Note (Signed)
  Emergency Medicine Provider Triage Evaluation Note  MRN:  [id number]  Arrival date & time: 06/25/21    Medically screening exam initiated at 1:27 AM.   CC:   Insect Bite  HPI:  [name] is a 41 y.o. year-old female presents to the ED with chief complaint of abscess.  Has a large red, swollen area to right buttock.  Eloped from [facility].  Febrile at home and in triage.  History provided by History provided by patient. ROS:  -As included in HPI PE:   Vitals:   06/25/21 0106  BP: 121/85  Pulse: 99  Resp: 18  Temp: 100.3 F (37.9 C)  SpO2: 98%    Non-toxic appearing No respiratory distress Large indurated cellulitic right buttock MDM:  Based on signs and symptoms, sepsis, abscess/cellulitis is highest on my differential. I've ordered sepsis labs, fluids, and abx in triage to expedite lab/diagnostic workup.  Patient was informed that the remainder of the evaluation will be completed by another provider, this initial triage assessment does not replace that evaluation, and the importance of remaining in the ED until their evaluation is complete.    Roxy Horseman, PA-C 06/25/21 0130

## 2021-06-25 NOTE — Progress Notes (Signed)
Pharmacy Antibiotic Note  [name] is a 41 y.o. female admitted on 06/25/2021 with R  buttocks abscess .  Pharmacy has been consulted for Vancomycin and Levaquin dosing.  Plan: Levaquin 750mg  IV q24h Vancomycin 750 mg IV Q 12 hrs. Goal AUC 400-550. Expected AUC: 521, SCr used: 0.9 Will f/u renal function, micro data, and pt's clinical condition Vanc levels prn   Height: 5\' 6"  (167.6 cm) Weight: 59 kg (130 lb) IBW/kg (Calculated) : 59.3  Temp (24hrs), Avg:99.6 F (37.6 C), Min:97.9 F (36.6 C), Max:100.4 F (38 C)  Recent Labs  Lab 06/25/21 0201 06/25/21 0228  WBC 21.3*  --   CREATININE 0.98 0.90  LATICACIDVEN 1.3  --     Estimated Creatinine Clearance: 75.8 mL/min (by C-G formula based on SCr of 0.9 mg/dL).    Allergies  Allergen Reactions   Cephalexin Hives    Pt states she can take ampicillin without problems    Antimicrobials this admission: 6/12 Vanc >>  6/12 Levaquin >>   Microbiology results:  BCx:   UCx:   6/12 buttock abscess:  Thank you for allowing pharmacy to be a part of this patient's care.  Sherlon Handing, PharmD, BCPS Please see amion for complete clinical pharmacist phone list 06/25/2021 2:02 PM

## 2021-06-25 NOTE — Sepsis Progress Note (Signed)
Elink following code sepsis °

## 2021-06-26 ENCOUNTER — Encounter (HOSPITAL_COMMUNITY): Payer: Self-pay | Admitting: Internal Medicine

## 2021-06-26 ENCOUNTER — Inpatient Hospital Stay (HOSPITAL_COMMUNITY): Payer: Medicare Other | Admitting: Certified Registered"

## 2021-06-26 ENCOUNTER — Encounter (HOSPITAL_COMMUNITY)
Admission: EM | Disposition: A | Discharge status: Home / Self Care | Payer: Self-pay | Source: Home / Self Care | Attending: Internal Medicine

## 2021-06-26 ENCOUNTER — Other Ambulatory Visit: Payer: Self-pay

## 2021-06-26 DIAGNOSIS — L03317 Cellulitis of buttock: Secondary | ICD-10-CM | POA: Diagnosis not present

## 2021-06-26 DIAGNOSIS — L0231 Cutaneous abscess of buttock: Secondary | ICD-10-CM | POA: Diagnosis not present

## 2021-06-26 DIAGNOSIS — F319 Bipolar disorder, unspecified: Secondary | ICD-10-CM | POA: Diagnosis not present

## 2021-06-26 DIAGNOSIS — F418 Other specified anxiety disorders: Secondary | ICD-10-CM

## 2021-06-26 DIAGNOSIS — M797 Fibromyalgia: Secondary | ICD-10-CM | POA: Diagnosis not present

## 2021-06-26 HISTORY — PX: INCISION AND DRAINAGE OF WOUND: SHX1803

## 2021-06-26 LAB — CBC
HCT: 30 % — ABNORMAL LOW (ref 36.0–46.0)
Hemoglobin: 10.2 g/dL — ABNORMAL LOW (ref 12.0–15.0)
MCH: 29.6 pg (ref 26.0–34.0)
MCHC: 34 g/dL (ref 30.0–36.0)
MCV: 87 fL (ref 80.0–100.0)
Platelets: 191 10*3/uL (ref 150–400)
RBC: 3.45 MIL/uL — ABNORMAL LOW (ref 3.87–5.11)
RDW: 13.1 % (ref 11.5–15.5)
WBC: 12.5 10*3/uL — ABNORMAL HIGH (ref 4.0–10.5)
nRBC: 0 % (ref 0.0–0.2)

## 2021-06-26 LAB — SURGICAL PCR SCREEN
MRSA, PCR: POSITIVE — AB
Staphylococcus aureus: POSITIVE — AB

## 2021-06-26 LAB — URINE CULTURE: Culture: NO GROWTH

## 2021-06-26 LAB — BASIC METABOLIC PANEL
Anion gap: 7 (ref 5–15)
BUN: 8 mg/dL (ref 6–20)
CO2: 27 mmol/L (ref 22–32)
Calcium: 8.3 mg/dL — ABNORMAL LOW (ref 8.9–10.3)
Chloride: 101 mmol/L (ref 98–111)
Creatinine, Ser: 0.88 mg/dL (ref 0.44–1.00)
GFR, Estimated: >60 mL/min (ref >60)
Glucose, Bld: 111 mg/dL — ABNORMAL HIGH (ref 70–99)
Potassium: 3.8 mmol/L (ref 3.5–5.1)
Sodium: 135 mmol/L (ref 135–145)

## 2021-06-26 LAB — RESP PANEL BY RT-PCR (FLU A&B, COVID) ARPGX2
Influenza A by PCR: NEGATIVE
Influenza B by PCR: NEGATIVE
SARS Coronavirus 2 by RT PCR: NEGATIVE

## 2021-06-26 SURGERY — IRRIGATION AND DEBRIDEMENT WOUND
Anesthesia: General | Site: Buttocks | Laterality: Right

## 2021-06-26 MED ORDER — ROCURONIUM BROMIDE 10 MG/ML (PF) SYRINGE
PREFILLED_SYRINGE | INTRAVENOUS | Status: AC
  Filled 2021-06-26: qty 10

## 2021-06-26 MED ORDER — DEXAMETHASONE SODIUM PHOSPHATE 10 MG/ML IJ SOLN
INTRAMUSCULAR | Status: AC
  Filled 2021-06-26: qty 1

## 2021-06-26 MED ORDER — SUGAMMADEX SODIUM 200 MG/2ML IV SOLN
INTRAVENOUS | Status: DC | PRN
  Administered 2021-06-26: 400 mg via INTRAVENOUS

## 2021-06-26 MED ORDER — LIDOCAINE 2% (20 MG/ML) 5 ML SYRINGE
INTRAMUSCULAR | Status: DC | PRN
  Administered 2021-06-26: 50 mg via INTRAVENOUS

## 2021-06-26 MED ORDER — ONDANSETRON HCL 4 MG/2ML IJ SOLN
INTRAMUSCULAR | Status: AC
  Filled 2021-06-26: qty 2

## 2021-06-26 MED ORDER — SUCCINYLCHOLINE CHLORIDE 200 MG/10ML IV SOSY
PREFILLED_SYRINGE | INTRAVENOUS | Status: AC
  Filled 2021-06-26: qty 10

## 2021-06-26 MED ORDER — DEXMEDETOMIDINE (PRECEDEX) IN NS 20 MCG/5ML (4 MCG/ML) IV SYRINGE
PREFILLED_SYRINGE | INTRAVENOUS | Status: DC | PRN
  Administered 2021-06-26: 10 ug via INTRAVENOUS

## 2021-06-26 MED ORDER — PROPOFOL 10 MG/ML IV BOLUS
INTRAVENOUS | Status: DC | PRN
  Administered 2021-06-26: 200 mg via INTRAVENOUS

## 2021-06-26 MED ORDER — PROPOFOL 10 MG/ML IV BOLUS
INTRAVENOUS | Status: AC
  Filled 2021-06-26: qty 20

## 2021-06-26 MED ORDER — ROCURONIUM BROMIDE 10 MG/ML (PF) SYRINGE
PREFILLED_SYRINGE | INTRAVENOUS | Status: AC
  Filled 2021-06-26: qty 20

## 2021-06-26 MED ORDER — PHENYLEPHRINE 80 MCG/ML (10ML) SYRINGE FOR IV PUSH (FOR BLOOD PRESSURE SUPPORT)
PREFILLED_SYRINGE | INTRAVENOUS | Status: AC
  Filled 2021-06-26: qty 10

## 2021-06-26 MED ORDER — FENTANYL CITRATE (PF) 100 MCG/2ML IJ SOLN: 25.0000 ug | INTRAMUSCULAR | Status: DC | PRN

## 2021-06-26 MED ORDER — CHLORHEXIDINE GLUCONATE 0.12 % MT SOLN: 15.0000 mL | Freq: Once | OROMUCOSAL | Status: AC

## 2021-06-26 MED ORDER — METHOCARBAMOL 500 MG PO TABS
500.0000 mg | ORAL_TABLET | Freq: Three times a day (TID) | ORAL | Status: DC | PRN
  Administered 2021-06-28 – 2021-06-29 (×2): 500 mg via ORAL
  Filled 2021-06-26 (×2): qty 1

## 2021-06-26 MED ORDER — CHLORHEXIDINE GLUCONATE 0.12 % MT SOLN
OROMUCOSAL | Status: AC
  Administered 2021-06-26: 15 mL via OROMUCOSAL
  Filled 2021-06-26: qty 15

## 2021-06-26 MED ORDER — MUPIROCIN 2 % EX OINT
1.0000 "application " | TOPICAL_OINTMENT | Freq: Two times a day (BID) | CUTANEOUS | Status: DC
  Administered 2021-06-26 – 2021-06-28 (×4): 1 via NASAL
  Filled 2021-06-26 (×3): qty 22

## 2021-06-26 MED ORDER — FENTANYL CITRATE (PF) 250 MCG/5ML IJ SOLN
INTRAMUSCULAR | Status: DC | PRN
Start: 2021-06-26 — End: 2021-06-26
  Administered 2021-06-26: 100 ug via INTRAVENOUS
  Administered 2021-06-26: 50 ug via INTRAVENOUS

## 2021-06-26 MED ORDER — ORAL CARE MOUTH RINSE: 15.0000 mL | Freq: Once | OROMUCOSAL | Status: AC

## 2021-06-26 MED ORDER — MIDAZOLAM HCL 2 MG/2ML IJ SOLN
INTRAMUSCULAR | Status: AC
  Filled 2021-06-26: qty 2

## 2021-06-26 MED ORDER — FENTANYL CITRATE (PF) 250 MCG/5ML IJ SOLN
INTRAMUSCULAR | Status: AC
  Filled 2021-06-26: qty 5

## 2021-06-26 MED ORDER — ACETAMINOPHEN 500 MG PO TABS
1000.0000 mg | ORAL_TABLET | Freq: Four times a day (QID) | ORAL | Status: DC
  Administered 2021-06-26 – 2021-06-28 (×3): 1000 mg via ORAL
  Filled 2021-06-26 (×4): qty 2

## 2021-06-26 MED ORDER — LACTATED RINGERS IV SOLN: INTRAVENOUS | Status: DC

## 2021-06-26 MED ORDER — KETAMINE HCL-SODIUM CHLORIDE 100-0.9 MG/10ML-% IV SOSY
PREFILLED_SYRINGE | INTRAVENOUS | Status: DC | PRN
  Administered 2021-06-26: 30 mg via INTRAVENOUS
  Administered 2021-06-26: 10 mg via INTRAVENOUS

## 2021-06-26 MED ORDER — ACETAMINOPHEN 500 MG PO TABS
1000.0000 mg | ORAL_TABLET | Freq: Once | ORAL | Status: AC
Start: 2021-06-26 — End: 2021-06-26
  Administered 2021-06-26: 1000 mg via ORAL
  Filled 2021-06-26: qty 2

## 2021-06-26 MED ORDER — KETAMINE HCL 50 MG/5ML IJ SOSY
PREFILLED_SYRINGE | INTRAMUSCULAR | Status: AC
  Filled 2021-06-26: qty 5

## 2021-06-26 MED ORDER — 0.9 % SODIUM CHLORIDE (POUR BTL) OPTIME
TOPICAL | Status: DC | PRN
  Administered 2021-06-26: 1000 mL

## 2021-06-26 MED ORDER — CHLORHEXIDINE GLUCONATE CLOTH 2 % EX PADS
6.0000 | MEDICATED_PAD | Freq: Every day | CUTANEOUS | Status: DC
  Administered 2021-06-26 – 2021-06-28 (×3): 6 via TOPICAL

## 2021-06-26 MED ORDER — ALBUMIN HUMAN 5 % IV SOLN: INTRAVENOUS | Status: DC | PRN

## 2021-06-26 MED ORDER — ROCURONIUM BROMIDE 10 MG/ML (PF) SYRINGE
PREFILLED_SYRINGE | INTRAVENOUS | Status: DC | PRN
  Administered 2021-06-26: 30 mg via INTRAVENOUS
  Administered 2021-06-26: 70 mg via INTRAVENOUS

## 2021-06-26 MED ORDER — PHENYLEPHRINE 80 MCG/ML (10ML) SYRINGE FOR IV PUSH (FOR BLOOD PRESSURE SUPPORT)
PREFILLED_SYRINGE | INTRAVENOUS | Status: DC | PRN
  Administered 2021-06-26 (×4): 160 ug via INTRAVENOUS

## 2021-06-26 SURGICAL SUPPLY — 29 items
BAG COUNTER SPONGE SURGICOUNT (BAG) ×2 IMPLANT
BNDG ELASTIC 4X5.8 VLCR STR LF (GAUZE/BANDAGES/DRESSINGS) IMPLANT
BNDG ELASTIC 6X5.8 VLCR STR LF (GAUZE/BANDAGES/DRESSINGS) IMPLANT
BNDG GAUZE ELAST 4 BULKY (GAUZE/BANDAGES/DRESSINGS) IMPLANT
CANISTER SUCT 3000ML PPV (MISCELLANEOUS) ×2 IMPLANT
COVER SURGICAL LIGHT HANDLE (MISCELLANEOUS) ×2 IMPLANT
DRAIN PENROSE 0.25X18 (DRAIN) ×1 IMPLANT
DRAPE HALF SHEET 40X57 (DRAPES) ×2 IMPLANT
DRAPE LAPAROTOMY 100X72 PEDS (DRAPES) ×1 IMPLANT
DRSG PAD ABDOMINAL 8X10 ST (GAUZE/BANDAGES/DRESSINGS) IMPLANT
ELECT REM PT RETURN 9FT ADLT (ELECTROSURGICAL) ×2
ELECTRODE REM PT RTRN 9FT ADLT (ELECTROSURGICAL) ×1 IMPLANT
GAUZE PACKING IODOFORM 1X5 (PACKING) ×1 IMPLANT
GAUZE SPONGE 4X4 12PLY STRL LF (GAUZE/BANDAGES/DRESSINGS) ×2 IMPLANT
GLOVE BIOGEL M STRL SZ7.5 (GLOVE) ×2 IMPLANT
GLOVE INDICATOR 8.0 STRL GRN (GLOVE) ×4 IMPLANT
GOWN STRL REUS W/ TWL LRG LVL3 (GOWN DISPOSABLE) ×1 IMPLANT
GOWN STRL REUS W/TWL 2XL LVL3 (GOWN DISPOSABLE) ×2 IMPLANT
GOWN STRL REUS W/TWL LRG LVL3 (GOWN DISPOSABLE) ×1
KIT BASIN OR (CUSTOM PROCEDURE TRAY) ×2 IMPLANT
KIT TURNOVER KIT B (KITS) ×2 IMPLANT
NS IRRIG 1000ML POUR BTL (IV SOLUTION) ×2 IMPLANT
PACK GENERAL/GYN (CUSTOM PROCEDURE TRAY) ×2 IMPLANT
PAD ABD 8X10 STRL (GAUZE/BANDAGES/DRESSINGS) ×1 IMPLANT
PAD ARMBOARD 7.5X6 YLW CONV (MISCELLANEOUS) ×2 IMPLANT
SUT ETHILON 2 0 FS 18 (SUTURE) ×1 IMPLANT
TOWEL GREEN STERILE (TOWEL DISPOSABLE) ×2 IMPLANT
TOWEL GREEN STERILE FF (TOWEL DISPOSABLE) ×1 IMPLANT
UNDERPAD 30X36 HEAVY ABSORB (UNDERPADS AND DIAPERS) ×2 IMPLANT

## 2021-06-26 NOTE — Interval H&P Note (Signed)
History and Physical Interval Note:  06/26/2021 9:14 AM  [name]  has presented today for surgery, with the diagnosis of Right Gluteal Abcess.  The various methods of treatment have been discussed with the patient and family. After consideration of risks, benefits and other options for treatment, the patient has consented to  Procedure(s): IRRIGATION AND DEBRIDEMENT OF RIGHT GLUTEAL WOUND (Right) as a surgical intervention.  The patient's history has been reviewed, patient examined, no change in status, stable for surgery.  I have reviewed the patient's chart and labs.  Questions were answered to the patient's satisfaction.    Discussed risk/benefits including bleeding/infection,wound issues, recurrence, need for daily changes, scarring, blood clots, perioperative pulm/cv issues  Gaynelle Adu

## 2021-06-26 NOTE — Anesthesia Procedure Notes (Signed)
Procedure Name: Intubation Date/Time: 06/26/2021 9:31 AM  Performed by: Rosiland Oz, CRNAPre-anesthesia Checklist: Patient identified, Emergency Drugs available, Suction available, Patient being monitored and Timeout performed Patient Re-evaluated:Patient Re-evaluated prior to induction Oxygen Delivery Method: Circle system utilized Preoxygenation: Pre-oxygenation with 100% oxygen Induction Type: IV induction Ventilation: Mask ventilation without difficulty Laryngoscope Size: Miller and 3 Grade View: Grade I Tube type: Oral Tube size: 7.5 mm Number of attempts: 1 Airway Equipment and Method: Stylet Placement Confirmation: ETT inserted through vocal cords under direct vision, positive ETCO2 and breath sounds checked- equal and bilateral Secured at: 21 cm Tube secured with: Tape Dental Injury: Teeth and Oropharynx as per pre-operative assessment

## 2021-06-26 NOTE — Progress Notes (Signed)
This RN found a pipe/drug paraphernalia in patients bed. Removed and placed in bag at nurses station. Charge nurse and MD made aware.  Donnalee Curry

## 2021-06-26 NOTE — Anesthesia Postprocedure Evaluation (Signed)
Anesthesia Post Note  Patient: [name]  Procedure(s) Performed: IRRIGATION AND DEBRIDEMENT OF RIGHT GLUTEAL WOUND (Right: Buttocks)     Patient location during evaluation: PACU Anesthesia Type: General Level of consciousness: awake and alert Pain management: pain level controlled Vital Signs Assessment: post-procedure vital signs reviewed and stable Respiratory status: spontaneous breathing, nonlabored ventilation, respiratory function stable and patient connected to nasal cannula oxygen Cardiovascular status: blood pressure returned to baseline and stable Postop Assessment: no apparent nausea or vomiting Anesthetic complications: no   No notable events documented.  Last Vitals:  Vitals:   06/26/21 1050 06/26/21 1217  BP:  (!) 85/62  Pulse:  63  Resp:  17  Temp: 36.7 C   SpO2:  97%    Last Pain:  Vitals:   06/26/21 1050  TempSrc:   PainSc: 0-No pain                 Quame Spratlin L Eamon Tantillo

## 2021-06-26 NOTE — Transfer of Care (Signed)
Immediate Anesthesia Transfer of Care Note  Patient: [name]  Procedure(s) Performed: IRRIGATION AND DEBRIDEMENT OF RIGHT GLUTEAL WOUND (Right: Buttocks)  Patient Location: PACU  Anesthesia Type:General  Level of Consciousness: drowsy and patient cooperative  Airway & Oxygen Therapy: Patient Spontanous Breathing  Post-op Assessment: Report given to RN and Post -op Vital signs reviewed and stable  Post vital signs: Reviewed and stable  Last Vitals:  Vitals Value Taken Time  BP 102/62 06/26/21 1020  Temp 36.7 C 06/26/21 1020  Pulse 73 06/26/21 1021  Resp 12 06/26/21 1021  SpO2 100 % 06/26/21 1021  Vitals shown include unvalidated device data.  Last Pain:  Vitals:   06/26/21 0544  TempSrc: Oral  PainSc:       Patients Stated Pain Goal: 2 (06/26/21 0004)  Complications: No notable events documented.

## 2021-06-26 NOTE — Op Note (Signed)
06/26/2021  11:15 AM  PATIENT:  [name]  41 y.o. female  PRE-OPERATIVE DIAGNOSIS:  Right Gluteal Abcess  POST-OPERATIVE DIAGNOSIS:  Right Gluteal Abcess  PROCEDURE:  Procedure(s): EXCISIONAL DEBRIDEMENT OF RIGHT GLUTEAL ABSCESS  SURGEON:  Surgeon(s): Gaynelle Adu, MD  ASSISTANTS: Barnetta Chapel PA-C   ANESTHESIA:   general  DRAINS: Penrose drain in the abscess cavity    LOCAL MEDICATIONS USED:  NONE  SPECIMEN:  Source of Specimen:  aerobic & anaerobic cultures  DISPOSITION OF SPECIMEN:   micro  COUNTS:  YES  INDICATION FOR PROCEDURE: 41 year old female with active polysubstance abuse who came into the ER yesterday with significant cellulitis on her right buttock after reportedly suffering from an insect bite a few days ago.  CT demonstrated fair amount of cellulitis of the right buttock however no drainable fluid collection.  She underwent a small I&D in the ER by the ER physician.  We were called for further evaluation.  There is still copious amounts of purulent fluid that we could evacuate from the wound.  Given the significant degree of cellulitis I thought she needed to go to the operating room for additional debridement.  Please see chart for discussions regarding risk and benefits  PROCEDURE: Patient was taken the OR 1 at [facility] placed upon on the operating room table.  General endotracheal anesthesia was established.  She had been placed on a beanbag.  We then placed her in the left lateral position with the appropriate padding and secured her to the bed.  Her right buttock was prepped and draped in the usual standard surgical fashion with Betadine.  The amount of cellulitis involving the right buttock was 11 cm x 15 cm with an open wound of approximately 1 cm.  Surgical timeout was performed.  She was on scheduled therapeutic IV antibiotics.  Using a 15 blade I made an elliptical incision further opening up the current wound.  There is again copious amounts of  drainage purulent fluid.  Cultures were obtained.  The wound at the end of the procedure measured 4 cm x 2 cm x 3 and half centimeters deep.  The wound did track into directions.  It tracked laterally and I made a counterincision and placed 1/4 inch Penrose drain through this area it was secured to itself with a 2-0 nylon.  We then irrigated the abscess cavity.  We are able to finger fracture into another pocket also extending laterally.  We obtained hemostasis with electrocautery.  The abscess cavity was then packed with 1 inch packing strip followed by fluffs and ABD and mesh underwear.  All needle, instrument sponge counts were correct x2.  There were no immediate complications.  We did irrigate the wound with some pulsatile lavage prior to packing it.  .  Tool used for debridement (curette, scapel, etc.) scalpel   Frequency of surgical debridement.   Initial surgical debridement in the OR    Measurement of total devitalized tissue (wound surface) before and after surgical debridement.   See above   Area and depth of devitalized tissue removed from wound.  See above    Blood loss and description of tissue removed.  25 mL, skin and subcutaneous fat  .  Was there any viable tissue removed (measurements): no   PLAN OF CARE:  already inpatient  PATIENT DISPOSITION:  PACU - hemodynamically stable.   Delay start of Pharmacological VTE agent (>24hrs) due to surgical blood loss or risk of bleeding:  no  Mary Sella.  Andrey Campanile, MD, FACS General, Bariatric, & Minimally Invasive Surgery [facility], [place]

## 2021-06-26 NOTE — Plan of Care (Signed)
  Problem: Clinical Measurements: Goal: Diagnostic test results will improve Outcome: Progressing   

## 2021-06-26 NOTE — Progress Notes (Signed)
PROGRESS NOTE    [name]  UXL:[id number] DOB: 09/29/1980 DOA: 06/25/2021 PCP: Pcp, No    Brief Narrative:  History of anxiety depression bipolar, polysubstance abuse presented with a right buttock abscess apparently after insect bite 2 days ago.  I&D attempted in the ER but incomplete.  Admitted with IV antibiotics and surgical consultation.   Assessment & Plan:   Right buttock abscess with cellulitis, spreading cellulitis: Blood cultures 6/12 negative so far. Nasal smear positive for MRSA. Remains on vancomycin and Zosyn Undergoing surgical resection under general anesthesia today.  Surgical culture will be appreciated.  Polysubstance abuse: Alcohol, amphetamine cocaine and opiates.  High risk of complications including withdrawals and DTs. Remains on CIWA protocol, folate thiamine and multivitamins.  Symptom triggered benzodiazepine protocol. Clonidine detox orders.  She will need some pain medication today.  Smoker: Counseled to quit.   DVT prophylaxis: enoxaparin (LOVENOX) injection 40 mg Start: 06/25/21 1000   Code Status: Full code Family Communication: None Disposition Plan: Status is: Inpatient Remains inpatient appropriate because: Significant infection.  Inpatient procedure planned.     Consultants:  General surgery  Procedures:  Going for surgery  Antimicrobials:  Vancomycin Zosyn 6/12---   Subjective: Patient seen and examined.  Flat affect and sleepy.  Very uninterested to conversation.  No other overnight events noted.  Objective: Vitals:   06/26/21 0544 06/26/21 1020 06/26/21 1035 06/26/21 1050  BP: (!) 81/65 102/62 (!) 93/58   Pulse: 90 78 75   Resp: 18 13 17    Temp: 99.2 F (37.3 C) 98 F (36.7 C)  98 F (36.7 C)  TempSrc: Oral     SpO2: 96% 100% 98%   Weight:      Height:        Intake/Output Summary (Last 24 hours) at 06/26/2021 1057 Last data filed at 06/26/2021 1016 Gross per 24 hour  Intake 2966.87 ml  Output 25 ml  Net  2941.87 ml   Filed Weights   06/25/21 0134 06/25/21 2058  Weight: 59 kg 61.7 kg    Examination:  General: Dozing off, uninterested in conversation.  Looks very comfortable.  Knows she is going for surgery. Cardiovascular: S1-S2 normal.  Regular rate rhythm. Respiratory: Bilateral clear Gastrointestinal: Soft.  Nontender bowel sound present Ext: No edema. Neuro: Intact. Musculoskeletal:  Right edematous and erythematous right buttock, picture in the chart.     Data Reviewed: I have personally reviewed following labs and imaging studies  CBC: Recent Labs  Lab 06/25/21 0201 06/25/21 0228 06/26/21 0645  WBC 21.3*  --  12.5*  NEUTROABS 18.0*  --   --   HGB 12.8 13.3 10.2*  HCT 36.5 39.0 30.0*  MCV 86.3  --  87.0  PLT 250  --  191   Basic Metabolic Panel: Recent Labs  Lab 06/25/21 0201 06/25/21 0228 06/26/21 0645  NA 129* 130* 135  K 3.7 3.7 3.8  CL 95* 94* 101  CO2 23  --  27  GLUCOSE 106* 105* 111*  BUN 8 7 8   CREATININE 0.98 0.90 0.88  CALCIUM 9.0  --  8.3*   GFR: Estimated Creatinine Clearance: 78 mL/min (by C-G formula based on SCr of 0.88 mg/dL). Liver Function Tests: Recent Labs  Lab 06/25/21 0201  AST 18  ALT 13  ALKPHOS 62  BILITOT 1.6*  PROT 7.5  ALBUMIN 3.7   No results for input(s): "LIPASE", "AMYLASE" in the last 168 hours. No results for input(s): "AMMONIA" in the last 168 hours. Coagulation Profile: Recent  Labs  Lab 06/25/21 0201  INR 1.1   Cardiac Enzymes: No results for input(s): "CKTOTAL", "CKMB", "CKMBINDEX", "TROPONINI" in the last 168 hours. BNP (last 3 results) No results for input(s): "PROBNP" in the last 8760 hours. HbA1C: No results for input(s): "HGBA1C" in the last 72 hours. CBG: No results for input(s): "GLUCAP" in the last 168 hours. Lipid Profile: No results for input(s): "CHOL", "HDL", "LDLCALC", "TRIG", "CHOLHDL", "LDLDIRECT" in the last 72 hours. Thyroid Function Tests: No results for input(s): "TSH",  "T4TOTAL", "FREET4", "T3FREE", "THYROIDAB" in the last 72 hours. Anemia Panel: No results for input(s): "VITAMINB12", "FOLATE", "FERRITIN", "TIBC", "IRON", "RETICCTPCT" in the last 72 hours. Sepsis Labs: Recent Labs  Lab 06/25/21 0201  LATICACIDVEN 1.3    Recent Results (from the past 240 hour(s))  Resp Panel by RT-PCR (Flu A&B, Covid) Anterior Nasal Swab     Status: None   Collection Time: 06/25/21  3:35 AM   Specimen: Anterior Nasal Swab  Result Value Ref Range Status   SARS Coronavirus 2 by RT PCR NEGATIVE NEGATIVE Final    Comment: (NOTE) SARS-CoV-2 target nucleic acids are NOT DETECTED.  The SARS-CoV-2 RNA is generally detectable in upper respiratory specimens during the acute phase of infection. The lowest concentration of SARS-CoV-2 viral copies this assay can detect is 138 copies/mL. A negative result does not preclude SARS-Cov-2 infection and should not be used as the sole basis for treatment or other patient management decisions. A negative result may occur with  improper specimen collection/handling, submission of specimen other than nasopharyngeal swab, presence of viral mutation(s) within the areas targeted by this assay, and inadequate number of viral copies(<138 copies/mL). A negative result must be combined with clinical observations, patient history, and epidemiological information. The expected result is Negative.  Fact Sheet for Patients:  BloggerCourse.com  Fact Sheet for Healthcare Providers:  SeriousBroker.it  This test is no t yet approved or cleared by the [place] FDA and  has been authorized for detection and/or diagnosis of SARS-CoV-2 by FDA under an Emergency Use Authorization (EUA). This EUA will remain  in effect (meaning this test can be used) for the duration of the COVID-19 declaration under Section 564(b)(1) of the Act, 21 U.S.C.section 360bbb-3(b)(1), unless the authorization is  terminated  or revoked sooner.       Influenza A by PCR NEGATIVE NEGATIVE Final   Influenza B by PCR NEGATIVE NEGATIVE Final    Comment: (NOTE) The Xpert Xpress SARS-CoV-2/FLU/RSV plus assay is intended as an aid in the diagnosis of influenza from Nasopharyngeal swab specimens and should not be used as a sole basis for treatment. Nasal washings and aspirates are unacceptable for Xpert Xpress SARS-CoV-2/FLU/RSV testing.  Fact Sheet for Patients: BloggerCourse.com  Fact Sheet for Healthcare Providers: SeriousBroker.it  This test is not yet approved or cleared by the [place] FDA and has been authorized for detection and/or diagnosis of SARS-CoV-2 by FDA under an Emergency Use Authorization (EUA). This EUA will remain in effect (meaning this test can be used) for the duration of the COVID-19 declaration under Section 564(b)(1) of the Act, 21 U.S.C. section 360bbb-3(b)(1), unless the authorization is terminated or revoked.  Performed at [facility], [address], [place], [place] [address]   Aerobic Culture w Gram Stain (superficial specimen)     Status: None (Preliminary result)   Collection Time: 06/25/21  5:00 PM   Specimen: Abscess  Result Value Ref Range Status   Specimen Description ABSCESS  Final   Special  Requests NONE  Final   Gram Stain   Final    NO WBC SEEN MODERATE GRAM POSITIVE COCCI IN CLUSTERS Performed at [facility], [address], [place], [place] [address]    Culture PENDING  Incomplete   Report Status PENDING  Incomplete  Surgical pcr screen     Status: Abnormal   Collection Time: 06/25/21  9:43 PM   Specimen: Anterior Nasal Swab  Result Value Ref Range Status   MRSA, PCR POSITIVE (A) NEGATIVE Final    Comment: RESULT CALLED TO, READ BACK BY AND VERIFIED WITH: E CASTRO,RN@[id number]  06/26/21 MK    Staphylococcus aureus POSITIVE (A) NEGATIVE Final    Comment: (NOTE) The Xpert SA  Assay (FDA approved for NASAL specimens in patients 63 years of age and older), is one component of a comprehensive surveillance program. It is not intended to diagnose infection nor to guide or monitor treatment. Performed at [facility], [address], [place], [place] [address]          Radiology Studies: CT ABDOMEN PELVIS W CONTRAST  Result Date: 06/25/2021 CLINICAL DATA:  Sepsis.  Abscess in the buttock. EXAM: CT ABDOMEN AND PELVIS WITH CONTRAST TECHNIQUE: Multidetector CT imaging of the abdomen and pelvis was performed using the standard protocol following bolus administration of intravenous contrast. RADIATION DOSE REDUCTION: This exam was performed according to the departmental dose-optimization program which includes automated exposure control, adjustment of the mA and/or kV according to patient size and/or use of iterative reconstruction technique. CONTRAST:  34mL OMNIPAQUE IOHEXOL 300 MG/ML  SOLN COMPARISON:  CT abdomen pelvis dated 02/02/2012. FINDINGS: Lower chest: The visualized lung bases are clear. No intra-abdominal free air or free fluid. Hepatobiliary: No focal liver abnormality is seen. No gallstones, gallbladder wall thickening, or biliary dilatation. Pancreas: Unremarkable. No pancreatic ductal dilatation or surrounding inflammatory changes. Spleen: Normal in size without focal abnormality. Adrenals/Urinary Tract: The adrenal glands unremarkable. The kidneys, visualized ureters, and urinary bladder appear unremarkable. Stomach/Bowel: There is no bowel obstruction or active inflammation. Normal appendix. Vascular/Lymphatic: The abdominal aorta and IVC are unremarkable. No portal venous gas. There is no adenopathy. Reproductive: The uterus is grossly unremarkable. No adnexal masses. Other: There is diffuse subcutaneous edema of the right gluteal region with slight thickening of the skin of the right buttock. Findings may represent cellulitis. Clinical correlation is  recommended. No drainable fluid collection or abscess. No soft tissue air. Probable mildly reactive right inguinal lymph nodes. Musculoskeletal: No acute osseous pathology. IMPRESSION: Findings may represent cellulitis of the right buttock. No drainable fluid collection or abscess. Electronically Signed   By: Elgie Collard M.D.   On: 06/25/2021 03:47   DG Chest Port 1 View  Result Date: 06/25/2021 CLINICAL DATA:  Questionable sepsis - evaluate for abnormality EXAM: PORTABLE CHEST 1 VIEW COMPARISON:  02/07/2012 FINDINGS: The heart size and mediastinal contours are within normal limits. Both lungs are clear. The visualized skeletal structures are unremarkable. IMPRESSION: No active disease. Electronically Signed   By: Charlett Nose M.D.   On: 06/25/2021 02:00        Scheduled Meds:  [MAR Hold] Chlorhexidine Gluconate Cloth  6 each Topical Q0600   [MAR Hold] cloNIDine  0.1 mg Oral QID   Followed by   [XIP Hold] cloNIDine  0.1 mg Oral BH-qamhs   Followed by   [facility] Hold] cloNIDine  0.1 mg Oral QAC breakfast   [MAR Hold] enoxaparin (LOVENOX) injection  40 mg Subcutaneous Q24H   [MAR Hold]  folic acid  1 mg Oral Daily   [MAR Hold] multivitamin with minerals  1 tablet Oral Daily   [MAR Hold] mupirocin ointment  1 application  Nasal BID   [MAR Hold] nicotine  14 mg Transdermal Daily   [MAR Hold] sodium chloride flush  3 mL Intravenous Q12H   [MAR Hold] thiamine  100 mg Oral Daily   Or   [MAR Hold] thiamine  100 mg Intravenous Daily   Continuous Infusions:  lactated ringers 75 mL/hr at 06/25/21 2212   lactated ringers 10 mL/hr at 06/26/21 0912   [MAR Hold] piperacillin-tazobactam (ZOSYN)  IV 3.375 g (06/26/21 0546)   [MAR Hold] vancomycin 750 mg (06/26/21 0544)     LOS: 1 day    Time spent: 35 minutes    Dorcas Carrow, MD [facility] Pager [phone]

## 2021-06-26 NOTE — Anesthesia Preprocedure Evaluation (Addendum)
Anesthesia Evaluation  Patient identified by MRN, date of birth, ID bandGeneral Assessment Comment:Patient somnolent but arousable  Reviewed: Allergy & Precautions, NPO status , Patient's Chart, lab work & pertinent test results  Airway Mallampati: II  TM Distance: >3 FB Neck ROM: Full    Dental  (+) Edentulous Upper, Edentulous Lower, Dental Advisory Given   Pulmonary Current Smoker and Patient abstained from smoking.,    Pulmonary exam normal breath sounds clear to auscultation       Cardiovascular negative cardio ROS Normal cardiovascular exam Rhythm:Regular Rate:Normal     Neuro/Psych PSYCHIATRIC DISORDERS Anxiety Depression Bipolar Disorder Schizophrenia negative neurological ROS     GI/Hepatic negative GI ROS, (+)     substance abuse  alcohol use, cocaine use, marijuana use, methamphetamine use and IV drug use,   Endo/Other  negative endocrine ROS  Renal/GU negative Renal ROS  negative genitourinary   Musculoskeletal  (+) Fibromyalgia -, narcotic dependent  Abdominal   Peds  Hematology negative hematology ROS (+)   Anesthesia Other Findings   Reproductive/Obstetrics                            Anesthesia Physical Anesthesia Plan  ASA: 3  Anesthesia Plan: General   Post-op Pain Management: Tylenol PO (pre-op)*, Ketamine IV* and Dilaudid IV   Induction: Intravenous  PONV Risk Score and Plan: 2 and Midazolam, Dexamethasone and Ondansetron  Airway Management Planned: Oral ETT  Additional Equipment:   Intra-op Plan:   Post-operative Plan: Extubation in OR  Informed Consent: I have reviewed the patients History and Physical, chart, labs and discussed the procedure including the risks, benefits and alternatives for the proposed anesthesia with the patient or authorized representative who has indicated his/her understanding and acceptance.     Dental advisory given  Plan  Discussed with: CRNA  Anesthesia Plan Comments:        Anesthesia Quick Evaluation

## 2021-06-26 NOTE — Progress Notes (Signed)
Patient found standing in room with packing from right buttocks wound in her hand with 5 small drops of blood on the floor. Assisted patient back to bed to repack wound.  Has a drain in place. Surgery paged and told to pack with wet to dry dressing.  Jean Rosenthal, RN

## 2021-06-26 NOTE — Discharge Instructions (Signed)
May shower to keep wound clean. [place] should remain in place until seen in the office Cover wound with dry dressing

## 2021-06-27 ENCOUNTER — Encounter (HOSPITAL_COMMUNITY): Payer: Self-pay | Admitting: General Surgery

## 2021-06-27 ENCOUNTER — Other Ambulatory Visit (HOSPITAL_COMMUNITY): Payer: Self-pay

## 2021-06-27 DIAGNOSIS — A4902 Methicillin resistant Staphylococcus aureus infection, unspecified site: Secondary | ICD-10-CM

## 2021-06-27 DIAGNOSIS — L03317 Cellulitis of buttock: Secondary | ICD-10-CM | POA: Diagnosis not present

## 2021-06-27 DIAGNOSIS — L0231 Cutaneous abscess of buttock: Secondary | ICD-10-CM | POA: Diagnosis not present

## 2021-06-27 LAB — AEROBIC CULTURE W GRAM STAIN (SUPERFICIAL SPECIMEN): Gram Stain: NONE SEEN

## 2021-06-27 MED ORDER — ORITAVANCIN DIPHOSPHATE 400 MG IV SOLR
1200.0000 mg | Freq: Once | INTRAVENOUS | Status: AC
  Administered 2021-06-27: 1200 mg via INTRAVENOUS
  Filled 2021-06-27: qty 120

## 2021-06-27 MED ORDER — IBUPROFEN 600 MG PO TABS
600.0000 mg | ORAL_TABLET | Freq: Three times a day (TID) | ORAL | Status: DC | PRN
  Administered 2021-06-28: 600 mg via ORAL
  Filled 2021-06-27: qty 1

## 2021-06-27 NOTE — Progress Notes (Signed)
Patient observed in hallway swaying with eyes closed, holding all belongings.   This RN returned patient back to room. Informed MD.  Jean Rosenthal, RN

## 2021-06-27 NOTE — TOC Benefit Eligibility Note (Signed)
Patient Scientific laboratory technician completed.     The patient is currently admitted and upon discharge could be taking Linezolid 600 mg.   The current 30 day co-pay is, unknown we are not contracted with patient's insurance, rejection states "product not covered/pharmacy contract limited to dispensing specific drugs" advised rph that patient has filled only at mail order pharmacy and [facility] in [place], advised her to send it to a [facility] locally.

## 2021-06-27 NOTE — Progress Notes (Signed)
PROGRESS NOTE  Alison BirkenheadLisa Bulger  JXB:[id number] DOB: 06/30/1980 DOA: 06/25/2021 PCP: Pcp, No   Brief Narrative:  Patient is a 41 year old female with history of polysubstance abuse, anxiety, depression, bipolar disorder who presented with right buttock abscess.  Patient reported that she got the abscess after insect bite her.  General surgery consulted and she underwent I&D.  Wound culture growing MRSA.  ID consulted  Assessment & Plan:  Principal Problem:   Cellulitis and abscess of buttock Active Problems:   Tobacco abuse   Polysubstance abuse (HCC)   Cellulitis/abscess of the right buttock: Presented with swelling, pain, redness of the right buttock.  Reported an insect bite.  Patient has history polysubstance abuse.  Denies any IV drug use. Started on broad-spectrum antibiotics.  Underwent I&D on 6/13 by general surgery.On penrose dressing.  Wound cultures showing MRSA.  Continue vancomycin for now. Patient has high risk for signing out AMA.  We will consult ID.    Polysubstance abuse: History of alcohol intake, amphetamines, cocaine, opiates.  Monitor for withdrawal, DTs.  Started on CIWA protocol, folic acid, vitamins.  Also on clonidine detox  Smoker: Counseled to quit          DVT prophylaxis:enoxaparin (LOVENOX) injection 40 mg Start: 06/25/21 1000     Code Status: Full Code  Family Communication: None at bedside  Patient status:Inpatient  Patient is from :Home  Anticipated discharge ZH:YQMVto:Home  Estimated DC date:in 1-2 days   Consultants: ID,general surgery  Procedures:I and D  Antimicrobials:  Anti-infectives (From admission, onward)    Start     Dose/Rate Route Frequency Ordered Stop   06/26/21 0500  levofloxacin (LEVAQUIN) IVPB 750 mg  Status:  Discontinued        750 mg 100 mL/hr over 90 Minutes Intravenous Every 24 hours 06/25/21 1409 06/25/21 1519   06/25/21 2300  piperacillin-tazobactam (ZOSYN) IVPB 3.375 g  Status:  Discontinued        3.375 g 12.5  mL/hr over 240 Minutes Intravenous Every 8 hours 06/25/21 1519 06/27/21 1336   06/25/21 1800  vancomycin (VANCOREADY) IVPB 750 mg/150 mL        750 mg 150 mL/hr over 60 Minutes Intravenous Every 12 hours 06/25/21 1409     06/25/21 0200  vancomycin (VANCOREADY) IVPB 1250 mg/250 mL        1,250 mg 166.7 mL/hr over 90 Minutes Intravenous  Once 06/25/21 0146 06/25/21 0727   06/25/21 0130  vancomycin (VANCOCIN) IVPB 1000 mg/200 mL premix  Status:  Discontinued        1,000 mg 200 mL/hr over 60 Minutes Intravenous  Once 06/25/21 0127 06/25/21 0146   06/25/21 0130  levofloxacin (LEVAQUIN) IVPB 750 mg        750 mg 100 mL/hr over 90 Minutes Intravenous  Once 06/25/21 0127 06/25/21 0317       Subjective:  Patient seen and examined at the bedside this morning.  She was trying to sign out AMA.  We calmed her down and got her back into her room.  She was again seen in the afternoon.  During my evaluation, she was comfortably sleeping on the bed with shoes on her legs.  Did not wake up on calling her name.  Objective: Vitals:   06/26/21 2140 06/27/21 0226 06/27/21 0514 06/27/21 0630  BP: (!) 129/91 122/68 103/73 102/76  Pulse: 75 86 (!) 56 79  Resp: 18  18   Temp: 97.7 F (36.5 C)  98.4 F (36.9 C)   TempSrc: Oral  SpO2: 100%  100%   Weight:      Height:        Intake/Output Summary (Last 24 hours) at 06/27/2021 1409 Last data filed at 06/27/2021 0800 Gross per 24 hour  Intake 1989.35 ml  Output 0 ml  Net 1989.35 ml   Filed Weights   06/25/21 0134 06/25/21 2058  Weight: 59 kg 61.7 kg    Examination:  General exam: Overall comfortable, not in distress, resting in bed Respiratory system:  no wheezes or crackles  Cardiovascular system: S1 & S2 heard, RRR.  Gastrointestinal system: Abdomen is nondistended, soft and nontender. Central nervous system: Sleeping  extremities: No edema, no clubbing ,no cyanosis Skin: Wound on the right gluteal region with dressing   Data  Reviewed: I have personally reviewed following labs and imaging studies  CBC: Recent Labs  Lab 06/25/21 0201 06/25/21 0228 06/26/21 0645  WBC 21.3*  --  12.5*  NEUTROABS 18.0*  --   --   HGB 12.8 13.3 10.2*  HCT 36.5 39.0 30.0*  MCV 86.3  --  87.0  PLT 250  --  191   Basic Metabolic Panel: Recent Labs  Lab 06/25/21 0201 06/25/21 0228 06/26/21 0645  NA 129* 130* 135  K 3.7 3.7 3.8  CL 95* 94* 101  CO2 23  --  27  GLUCOSE 106* 105* 111*  BUN 8 7 8   CREATININE 0.98 0.90 0.88  CALCIUM 9.0  --  8.3*     Recent Results (from the past 240 hour(s))  Blood Culture (routine x 2)     Status: None (Preliminary result)   Collection Time: 06/25/21  2:01 AM   Specimen: BLOOD  Result Value Ref Range Status   Specimen Description BLOOD RIGHT ANTECUBITAL  Final   Special Requests   Final    BOTTLES DRAWN AEROBIC AND ANAEROBIC Blood Culture adequate volume   Culture   Final    NO GROWTH 2 DAYS Performed at [facility] Lab, [address] [place], [place]    Report Status PENDING  Incomplete  Resp Panel by RT-PCR (Flu A&B, Covid) Anterior Nasal Swab     Status: None   Collection Time: 06/25/21  3:35 AM   Specimen: Anterior Nasal Swab  Result Value Ref Range Status   SARS Coronavirus 2 by RT PCR NEGATIVE NEGATIVE Final    Comment: (NOTE) SARS-CoV-2 target nucleic acids are NOT DETECTED.  The SARS-CoV-2 RNA is generally detectable in upper respiratory specimens during the acute phase of infection. The lowest concentration of SARS-CoV-2 viral copies this assay can detect is 138 copies/mL. A negative result does not preclude SARS-Cov-2 infection and should not be used as the sole basis for treatment or other patient management decisions. A negative result may occur with  improper specimen collection/handling, submission of specimen other than nasopharyngeal swab, presence of viral mutation(s) within the areas targeted by this assay, and inadequate number of  viral copies(<138 copies/mL). A negative result must be combined with clinical observations, patient history, and epidemiological information. The expected result is Negative.  Fact Sheet for Patients:  08/25/21  Fact Sheet for Healthcare Providers:  BloggerCourse.com  This test is no t yet approved or cleared by the [place] FDA and  has been authorized for detection and/or diagnosis of SARS-CoV-2 by FDA under an Emergency Use Authorization (EUA). This EUA will remain  in effect (meaning this test can be used) for the duration of the COVID-19 declaration under Section 564(b)(1) of the  Act, 21 U.S.C.section 360bbb-3(b)(1), unless the authorization is terminated  or revoked sooner.       Influenza A by PCR NEGATIVE NEGATIVE Final   Influenza B by PCR NEGATIVE NEGATIVE Final    Comment: (NOTE) The Xpert Xpress SARS-CoV-2/FLU/RSV plus assay is intended as an aid in the diagnosis of influenza from Nasopharyngeal swab specimens and should not be used as a sole basis for treatment. Nasal washings and aspirates are unacceptable for Xpert Xpress SARS-CoV-2/FLU/RSV testing.  Fact Sheet for Patients: BloggerCourse.com  Fact Sheet for Healthcare Providers: SeriousBroker.it  This test is not yet approved or cleared by the [place] FDA and has been authorized for detection and/or diagnosis of SARS-CoV-2 by FDA under an Emergency Use Authorization (EUA). This EUA will remain in effect (meaning this test can be used) for the duration of the COVID-19 declaration under Section 564(b)(1) of the Act, 21 U.S.C. section 360bbb-3(b)(1), unless the authorization is terminated or revoked.  Performed at [facility], [address], [place], [place] [address]   Aerobic Culture w Gram Stain (superficial specimen)     Status: None   Collection Time: 06/25/21  5:00 PM    Specimen: Abscess  Result Value Ref Range Status   Specimen Description ABSCESS  Final   Special Requests NONE  Final   Gram Stain   Final    NO WBC SEEN MODERATE GRAM POSITIVE COCCI IN CLUSTERS Performed at [facility], [address] [place], [place] [address]    Culture   Final    ABUNDANT METHICILLIN RESISTANT STAPHYLOCOCCUS AUREUS   Report Status 06/27/2021 FINAL  Final   Organism ID, Bacteria METHICILLIN RESISTANT STAPHYLOCOCCUS AUREUS  Final      Susceptibility   Methicillin resistant staphylococcus aureus - MIC*    CIPROFLOXACIN >=8 RESISTANT Resistant     ERYTHROMYCIN >=8 RESISTANT Resistant     GENTAMICIN <=0.5 SENSITIVE Sensitive     OXACILLIN >=4 RESISTANT Resistant     TETRACYCLINE >=16 RESISTANT Resistant     VANCOMYCIN 1 SENSITIVE Sensitive     TRIMETH/SULFA >=320 RESISTANT Resistant     CLINDAMYCIN <=0.25 SENSITIVE Sensitive     RIFAMPIN <=0.5 SENSITIVE Sensitive     Inducible Clindamycin NEGATIVE Sensitive     * ABUNDANT METHICILLIN RESISTANT STAPHYLOCOCCUS AUREUS  Blood Culture (routine x 2)     Status: None (Preliminary result)   Collection Time: 06/25/21  6:49 PM   Specimen: BLOOD  Result Value Ref Range Status   Specimen Description BLOOD LEFT ANTECUBITAL  Final   Special Requests   Final    BOTTLES DRAWN AEROBIC AND ANAEROBIC Blood Culture adequate volume   Culture   Final    NO GROWTH 2 DAYS Performed at [facility], [address] [place], [place] [address]    Report Status PENDING  Incomplete  Urine Culture     Status: None   Collection Time: 06/25/21  6:49 PM   Specimen: In/Out Cath Urine  Result Value Ref Range Status   Specimen Description IN/OUT CATH URINE  Final   Special Requests NONE  Final   Culture   Final    NO GROWTH Performed at [facility] Lab, [address] [place], [place] [address]    Report Status 06/26/2021 FINAL  Final  Surgical pcr screen     Status: Abnormal   Collection Time: 06/25/21  9:43 PM    Specimen: Anterior Nasal Swab  Result Value Ref Range Status   MRSA,  PCR POSITIVE (A) NEGATIVE Final    Comment: RESULT CALLED TO, READ BACK BY AND VERIFIED WITH: E CASTRO,RN@0044  06/26/21 MK    Staphylococcus aureus POSITIVE (A) NEGATIVE Final    Comment: (NOTE) The Xpert SA Assay (FDA approved for NASAL specimens in patients 71 years of age and older), is one component of a comprehensive surveillance program. It is not intended to diagnose infection nor to guide or monitor treatment. Performed at [facility], [address], [place], [place] [address]   Aerobic/Anaerobic Culture w Gram Stain (surgical/deep wound)     Status: None (Preliminary result)   Collection Time: 06/26/21  9:50 AM   Specimen: PATH Other; Body Fluid  Result Value Ref Range Status   Specimen Description BUTTOCKS RIGHT  Final   Special Requests NONE  Final   Gram Stain   Final    FEW WBC PRESENT,BOTH PMN AND MONONUCLEAR RARE GRAM POSITIVE COCCI IN PAIRS    Culture   Final    ABUNDANT STAPHYLOCOCCUS AUREUS SUSCEPTIBILITIES TO FOLLOW Performed at [facility], [address], [place], [place] [address]    Report Status PENDING  Incomplete     Radiology Studies: No results found.  Scheduled Meds:  acetaminophen  1,000 mg Oral Q6H   Chlorhexidine Gluconate Cloth  6 each Topical Q0600   cloNIDine  0.1 mg Oral BH-qamhs   Followed by   Melene Muller ON 06/29/2021] cloNIDine  0.1 mg Oral QAC breakfast   enoxaparin (LOVENOX) injection  40 mg Subcutaneous Q24H   folic acid  1 mg Oral Daily   multivitamin with minerals  1 tablet Oral Daily   mupirocin ointment  1 application  Nasal BID   nicotine  14 mg Transdermal Daily   sodium chloride flush  3 mL Intravenous Q12H   thiamine  100 mg Oral Daily   Or   thiamine  100 mg Intravenous Daily   Continuous Infusions:  lactated ringers 75 mL/hr at 06/26/21 1951   vancomycin 750 mg (06/27/21 0546)     LOS: 2 days   Burnadette Pop, MD [facility] P6/14/2023, 2:09 PM
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
## 2021-06-27 NOTE — Progress Notes (Signed)
1 Day Post-Op  Subjective: Patient very lethargic, "high" appearing.  Pipe and drug paraphernalia found in her bed last night.  I'm concerned she is still taking drugs from her own supply while here.  She was very unsteady on her feet walking around her room while I was present secondary to her current states.  Doesn't respond much to questioning about her buttock.  ROS: See above, otherwise other systems negative  Objective: Vital signs in last 24 hours: Temp:  [97.7 F (36.5 C)-98.4 F (36.9 C)] 98.4 F (36.9 C) (06/14 0514) Pulse Rate:  [56-98] 79 (06/14 0630) Resp:  [13-18] 18 (06/14 0514) BP: (85-129)/(58-91) 102/76 (06/14 0630) SpO2:  [97 %-100 %] 100 % (06/14 0514) Last BM Date : 06/24/21  Intake/Output from previous day: 06/13 0701 - 06/14 0700 In: 2739.4 [P.O.:660; I.V.:1400.2; IV Piggyback:679.1] Out: 25 [Blood:25] Intake/Output this shift: Total I/O In: 300 [P.O.:300] Out: -   PE: Buttock: still with a fair amount of erythema and cellulitis.  Wound is overall clean, small amount of drainage from one of the tunnels.  Penrose in place.  Lab Results:  Recent Labs    06/25/21 0201 06/25/21 0228 06/26/21 0645  WBC 21.3*  --  12.5*  HGB 12.8 13.3 10.2*  HCT 36.5 39.0 30.0*  PLT 250  --  191   BMET Recent Labs    06/25/21 0201 06/25/21 0228 06/26/21 0645  NA 129* 130* 135  K 3.7 3.7 3.8  CL 95* 94* 101  CO2 23  --  27  GLUCOSE 106* 105* 111*  BUN 8 7 8   CREATININE 0.98 0.90 0.88  CALCIUM 9.0  --  8.3*   PT/INR Recent Labs    06/25/21 0201  LABPROT 14.0  INR 1.1   CMP     Component Value Date/Time   NA 135 06/26/2021 0645   K 3.8 06/26/2021 0645   CL 101 06/26/2021 0645   CO2 27 06/26/2021 0645   GLUCOSE 111 (H) 06/26/2021 0645   BUN 8 06/26/2021 0645   CREATININE 0.88 06/26/2021 0645   CALCIUM 8.3 (L) 06/26/2021 0645   PROT 7.5 06/25/2021 0201   ALBUMIN 3.7 06/25/2021 0201   AST 18 06/25/2021 0201   ALT 13 06/25/2021 0201    ALKPHOS 62 06/25/2021 0201   BILITOT 1.6 (H) 06/25/2021 0201   GFRNONAA >60 06/26/2021 0645   GFRAA >90 07/26/2013 1846   Lipase     Component Value Date/Time   LIPASE 40 10/15/2012 1930       Studies/Results: No results found.  Anti-infectives: Anti-infectives (From admission, onward)    Start     Dose/Rate Route Frequency Ordered Stop   06/26/21 0500  levofloxacin (LEVAQUIN) IVPB 750 mg  Status:  Discontinued        750 mg 100 mL/hr over 90 Minutes Intravenous Every 24 hours 06/25/21 1409 06/25/21 1519   06/25/21 2300  piperacillin-tazobactam (ZOSYN) IVPB 3.375 g        3.375 g 12.5 mL/hr over 240 Minutes Intravenous Every 8 hours 06/25/21 1519     06/25/21 1800  vancomycin (VANCOREADY) IVPB 750 mg/150 mL        750 mg 150 mL/hr over 60 Minutes Intravenous Every 12 hours 06/25/21 1409     06/25/21 0200  vancomycin (VANCOREADY) IVPB 1250 mg/250 mL        1,250 mg 166.7 mL/hr over 90 Minutes Intravenous  Once 06/25/21 0146 06/25/21 0727   06/25/21 0130  vancomycin (VANCOCIN) IVPB 1000  mg/200 mL premix  Status:  Discontinued        1,000 mg 200 mL/hr over 60 Minutes Intravenous  Once 06/25/21 0127 06/25/21 0146   06/25/21 0130  levofloxacin (LEVAQUIN) IVPB 750 mg        750 mg 100 mL/hr over 90 Minutes Intravenous  Once 06/25/21 0127 06/25/21 0317        Assessment/Plan POD 1, s/p I&D of R gluteal abscess with penrose placement, Dr. Andrey Campanile 6/13 -cx so far show gram + cocci.  Cont abx, may only end up needing vanc -no further packing required.  Leave penrose in place -start sitz bathes today -will stop narcotic pain medication given patient appears to be sneaking illegal drugs.  Do not feel comfortable with this amount of sedating medications with what she is giving herself.  Agree with asking security to come check her belongs. -D/W primary service.  FEN - regular diet VTE - Lovenox ID - vanc/zosyn  Polysubstance abuse ETOH abuse Schizophrenia Bipolar  d/o Anxiety/depression    LOS: 2 days    Letha Cape , [facility] 06/27/2021, 9:14 AM Please see Amion for pager number during day hours 7:00am-4:30pm or 7:00am -11:30am on weekends

## 2021-06-27 NOTE — Plan of Care (Signed)
  Problem: Clinical Measurements: Goal: Ability to maintain clinical measurements within normal limits will improve Outcome: Progressing Goal: Will remain free from infection Outcome: Progressing   

## 2021-06-27 NOTE — Progress Notes (Signed)
VAST RN to bedside for IV placement. Patient not cooperative with full assessment of arms for IV placement. Patient points at left [facility] and states "this is the ONLY place you can stick, and only one try." Offered patient about use of ultrasound to find better vein, that AC vein is tough and has been stuck multiple times. Patient continues to state "here only, the vein is right here."   22g PIV placed in left [facility] per patient request.

## 2021-06-28 DIAGNOSIS — B9562 Methicillin resistant Staphylococcus aureus infection as the cause of diseases classified elsewhere: Secondary | ICD-10-CM

## 2021-06-28 DIAGNOSIS — L0231 Cutaneous abscess of buttock: Secondary | ICD-10-CM | POA: Diagnosis not present

## 2021-06-28 DIAGNOSIS — L03317 Cellulitis of buttock: Secondary | ICD-10-CM | POA: Diagnosis not present

## 2021-06-28 LAB — CBC
HCT: 31.6 % — ABNORMAL LOW (ref 36.0–46.0)
Hemoglobin: 11 g/dL — ABNORMAL LOW (ref 12.0–15.0)
MCH: 30.2 pg (ref 26.0–34.0)
MCHC: 34.8 g/dL (ref 30.0–36.0)
MCV: 86.8 fL (ref 80.0–100.0)
Platelets: 279 10*3/uL (ref 150–400)
RBC: 3.64 MIL/uL — ABNORMAL LOW (ref 3.87–5.11)
RDW: 13.2 % (ref 11.5–15.5)
WBC: 10.4 10*3/uL (ref 4.0–10.5)
nRBC: 0 % (ref 0.0–0.2)

## 2021-06-28 NOTE — Progress Notes (Signed)
2 Days Post-Op  Subjective: More alert and awake today than yesterday.  Having a lot of pain in her right buttock.    ROS: See above, otherwise other systems negative  Objective: Vital signs in last 24 hours: Temp:  [97.7 F (36.5 C)-98.1 F (36.7 C)] 98.1 F (36.7 C) (06/15 0827) Pulse Rate:  [69-92] 87 (06/15 0827) Resp:  [16-18] 16 (06/15 0827) BP: (99-119)/(72-98) 119/98 (06/15 0827) SpO2:  [97 %-99 %] 99 % (06/15 0827) Last BM Date : 06/26/21  Intake/Output from previous day: 06/14 0701 - 06/15 0700 In: 1158.8 [P.O.:420; I.V.:576; IV Piggyback:162.8] Out: 0  Intake/Output this shift: No intake/output data recorded.  PE: Buttock:  cellulitis has lessened focally around open wound, but has diffusely spread and worsened across her entire gluteus as well as down her thigh.  There is still some purulent drainage noted from a small tunnel that tracks laterally.  No overt areas of fluctuance or significant induration on exam to point to another area that needs drainage per se.    Lab Results:  Recent Labs    06/26/21 0645  WBC 12.5*  HGB 10.2*  HCT 30.0*  PLT 191   BMET Recent Labs    06/26/21 0645  NA 135  K 3.8  CL 101  CO2 27  GLUCOSE 111*  BUN 8  CREATININE 0.88  CALCIUM 8.3*   PT/INR No results for input(s): "LABPROT", "INR" in the last 72 hours.  CMP     Component Value Date/Time   NA 135 06/26/2021 0645   K 3.8 06/26/2021 0645   CL 101 06/26/2021 0645   CO2 27 06/26/2021 0645   GLUCOSE 111 (H) 06/26/2021 0645   BUN 8 06/26/2021 0645   CREATININE 0.88 06/26/2021 0645   CALCIUM 8.3 (L) 06/26/2021 0645   PROT 7.5 06/25/2021 0201   ALBUMIN 3.7 06/25/2021 0201   AST 18 06/25/2021 0201   ALT 13 06/25/2021 0201   ALKPHOS 62 06/25/2021 0201   BILITOT 1.6 (H) 06/25/2021 0201   GFRNONAA >60 06/26/2021 0645   GFRAA >90 07/26/2013 1846   Lipase     Component Value Date/Time   LIPASE 40 10/15/2012 1930       Studies/Results: No  results found.  Anti-infectives: Anti-infectives (From admission, onward)    Start     Dose/Rate Route Frequency Ordered Stop   06/27/21 1730  Oritavancin Diphosphate (ORBACTIV) 1,200 mg in dextrose 5 % IVPB        1,200 mg 333.3 mL/hr over 180 Minutes Intravenous Once 06/27/21 1621 06/27/21 2255   06/26/21 0500  levofloxacin (LEVAQUIN) IVPB 750 mg  Status:  Discontinued        750 mg 100 mL/hr over 90 Minutes Intravenous Every 24 hours 06/25/21 1409 06/25/21 1519   06/25/21 2300  piperacillin-tazobactam (ZOSYN) IVPB 3.375 g  Status:  Discontinued        3.375 g 12.5 mL/hr over 240 Minutes Intravenous Every 8 hours 06/25/21 1519 06/27/21 1336   06/25/21 1800  vancomycin (VANCOREADY) IVPB 750 mg/150 mL  Status:  Discontinued        750 mg 150 mL/hr over 60 Minutes Intravenous Every 12 hours 06/25/21 1409 06/27/21 1621   06/25/21 0200  vancomycin (VANCOREADY) IVPB 1250 mg/250 mL        1,250 mg 166.7 mL/hr over 90 Minutes Intravenous  Once 06/25/21 0146 06/25/21 0727   06/25/21 0130  vancomycin (VANCOCIN) IVPB 1000 mg/200 mL premix  Status:  Discontinued  1,000 mg 200 mL/hr over 60 Minutes Intravenous  Once 06/25/21 0127 06/25/21 0146   06/25/21 0130  levofloxacin (LEVAQUIN) IVPB 750 mg        750 mg 100 mL/hr over 90 Minutes Intravenous  Once 06/25/21 0127 06/25/21 0317        Assessment/Plan POD 2, s/p I&D of R gluteal abscess with penrose placement, Dr. Andrey Campanile 6/13 -cx so far show gram + cocci, staph (nasal swab + MRSA) so wound likely MRSA.  Vanc zosyn stopped yesterday and given a dose of Orbactiv -Leave penrose in place -sitz bathes 4x/day -narcotics held due to concern for using personal drugs.  Tylenol and ibuprofen in place.  If verified that she has no further recreational drugs in her room, then I'm ok with resuming some narcotics for pain. -D/W primary service, ID. -worsening cellulitis today.  No overt areas that are present that clearly need opening up, but  wonder if we need to open up this one lateral tract.  Will have MD evaluate and determine. -NPO for now until decision made about further OR needs.  FEN - NPO VTE - Lovenox ID - vanc/zosyn stopped 6/14, Ocbactiv 6/14  Polysubstance abuse ETOH abuse Schizophrenia Bipolar d/o Anxiety/depression    LOS: 3 days    Letha Cape , [facility] 06/28/2021, 8:46 AM Please see Amion for pager number during day hours 7:00am-4:30pm or 7:00am -11:30am on weekends

## 2021-06-28 NOTE — Progress Notes (Signed)
    [facility]    Date of Admission:  06/25/2021   Total days of antibiotics 5   ID: [name] is a 41 y.o. female with   Principal Problem:   Cellulitis and abscess of buttock Active Problems:   Tobacco abuse   Polysubstance abuse (HCC)   MRSA infection    Subjective: Remains sleepy no fevers noted. She tolerated oritavancin infusion last night  Leukocytosis trending down  Medications:   acetaminophen  1,000 mg Oral Q6H   Chlorhexidine Gluconate Cloth  6 each Topical Q0600   cloNIDine  0.1 mg Oral BH-qamhs   Followed by   Melene Muller ON 06/29/2021] cloNIDine  0.1 mg Oral QAC breakfast   enoxaparin (LOVENOX) injection  40 mg Subcutaneous Q24H   folic acid  1 mg Oral Daily   multivitamin with minerals  1 tablet Oral Daily   mupirocin ointment  1 application  Nasal BID   nicotine  14 mg Transdermal Daily   sodium chloride flush  3 mL Intravenous Q12H   thiamine  100 mg Oral Daily   Or   thiamine  100 mg Intravenous Daily    Objective: Vital signs in last 24 hours: Temp:  [97.7 F (36.5 C)-98.1 F (36.7 C)] 98.1 F (36.7 C) (06/15 0827) Pulse Rate:  [69-92] 87 (06/15 0827) Resp:  [16-18] 16 (06/15 0827) BP: (99-119)/(72-98) 119/98 (06/15 0827) SpO2:  [97 %-99 %] 99 % (06/15 0827)  Physical Exam  Constitutional:  oriented to person, place, and time. appears well-developed and well-nourished. No distress.  HENT: [place]/AT, PERRLA, no scleral icterus Mouth/Throat: Oropharynx is clear and moist. No oropharyngeal exudate.  Cardiovascular: Normal rate, regular rhythm and normal heart sounds. Exam reveals no gallop and no friction rub.  No murmur heard.  Pulmonary/Chest: Effort normal and breath sounds normal. No respiratory distress.  has no wheezes.  Buttocks = spreading erythema but less indurated, penrose in place Lymphadenopathy: no cervical adenopathy. No axillary adenopathy Neurological: alert and oriented to person, place, and time.  Skin:  Skin is warm and dry. No rash noted. No erythema.  Psychiatric: a normal mood and affect.  behavior is normal.     Lab Results Recent Labs    06/26/21 0645 06/28/21 0911  WBC 12.5* 10.4  HGB 10.2* 11.0*  HCT 30.0* 31.6*  NA 135  --   K 3.8  --   CL 101  --   CO2 27  --   BUN 8  --   CREATININE 0.88  --      Microbiology: MRSA Studies/Results: No results found.   Assessment/Plan: Mrsa soft skin tissue infection/gluteal abscess s/p I x D with penrose in place = has long acting-lipoglycopeptide -oritavancin- that covers MRSA skin infection. Will continue to monitor response to abtx. Can see cellulitis worsen at the initiation of abtx but seems delayed. Leukocytosis is improving.   [facility] Pager: [phone]  06/28/2021, 5:01 PM

## 2021-06-28 NOTE — Plan of Care (Signed)
°  Problem: Coping: °Goal: Level of anxiety will decrease °Outcome: Progressing °  °

## 2021-06-28 NOTE — Progress Notes (Signed)
PROGRESS NOTE  [name]  YTK:[id number] DOB: October 14, 1980 DOA: 06/25/2021 PCP: Pcp, No   Brief Narrative:  Patient is a 41 year old female with history of polysubstance abuse, anxiety, depression, bipolar disorder who presented with right buttock abscess.  Patient reported that she got the abscess after insect bite her.  General surgery consulted and she underwent I&D.  Wound culture showed MRSA.  ID consulted  Assessment & Plan:  Principal Problem:   Cellulitis and abscess of buttock Active Problems:   Tobacco abuse   Polysubstance abuse (HCC)   MRSA infection   Cellulitis/abscess of the right buttock: Presented with swelling, pain, redness of the right buttock.  Reported an insect bite.  Patient has history polysubstance abuse.  Denies any IV drug use. Started on broad-spectrum antibiotics.  Underwent I&D on 6/13 by general surgery.On penrose dressing.  Wound cultures showed MRSA.  She was on vancomycin.  Given oritavancin on 6/14 ,1 dose  Polysubstance abuse: History of alcohol intake, amphetamines, cocaine, opiates.  Monitor for withdrawal, DTs.  Started on CIWA protocol, folic acid, vitamins.  Also on clonidine detox  Smoker: Counseled to quit          DVT prophylaxis:enoxaparin (LOVENOX) injection 40 mg Start: 06/25/21 1000     Code Status: Full Code  Family Communication: None at bedside  Patient status:Inpatient  Patient is from :Home  Anticipated discharge FT:[facility]  Estimated DC date:in 1-2 days.  Needs clearance from general surgery, ID   Consultants: ID,general surgery  Procedures:I and D  Antimicrobials:  Anti-infectives (From admission, onward)    Start     Dose/Rate Route Frequency Ordered Stop   06/27/21 1730  Oritavancin Diphosphate (ORBACTIV) 1,200 mg in dextrose 5 % IVPB        1,200 mg 333.3 mL/hr over 180 Minutes Intravenous Once 06/27/21 1621 06/27/21 2255   06/26/21 0500  levofloxacin (LEVAQUIN) IVPB 750 mg  Status:  Discontinued         750 mg 100 mL/hr over 90 Minutes Intravenous Every 24 hours 06/25/21 1409 06/25/21 1519   06/25/21 2300  piperacillin-tazobactam (ZOSYN) IVPB 3.375 g  Status:  Discontinued        3.375 g 12.5 mL/hr over 240 Minutes Intravenous Every 8 hours 06/25/21 1519 06/27/21 1336   06/25/21 1800  vancomycin (VANCOREADY) IVPB 750 mg/150 mL  Status:  Discontinued        750 mg 150 mL/hr over 60 Minutes Intravenous Every 12 hours 06/25/21 1409 06/27/21 1621   06/25/21 0200  vancomycin (VANCOREADY) IVPB 1250 mg/250 mL        1,250 mg 166.7 mL/hr over 90 Minutes Intravenous  Once 06/25/21 0146 06/25/21 0727   06/25/21 0130  vancomycin (VANCOCIN) IVPB 1000 mg/200 mL premix  Status:  Discontinued        1,000 mg 200 mL/hr over 60 Minutes Intravenous  Once 06/25/21 0127 06/25/21 0146   06/25/21 0130  levofloxacin (LEVAQUIN) IVPB 750 mg        750 mg 100 mL/hr over 90 Minutes Intravenous  Once 06/25/21 0127 06/25/21 0317       Subjective:  Patient seen and examined at the bedside this morning.  Hemodynamically stable.  Lying in bed.  She is more alert today and tries to communicate but is still looks drowsy/sleepy.C/o pain on the right gluteal region Objective: Vitals:   06/27/21 1954 06/27/21 2046 06/28/21 0515 06/28/21 0827  BP: 118/76 99/72 109/78 (!) 119/98  Pulse: 92 69 74 87  Resp:  18  16  Temp:  97.7 F (36.5 C) 97.8 F (36.6 C) 98.1 F (36.7 C)  TempSrc:  Oral Oral Oral  SpO2:  99% 99% 99%  Weight:      Height:        Intake/Output Summary (Last 24 hours) at 06/28/2021 1146 Last data filed at 06/28/2021 0900 Gross per 24 hour  Intake 858.83 ml  Output 0 ml  Net 858.83 ml   Filed Weights   06/25/21 0134 06/25/21 2058  Weight: 59 kg 61.7 kg    Examination:  General exam: Overall comfortable, not in distress HEENT: PERRL Respiratory system:  no wheezes or crackles  Cardiovascular system: S1 & S2 heard, RRR.  Gastrointestinal system: Abdomen is nondistended, soft and  nontender. Central nervous system: Alert and oriented Extremities: No edema, no clubbing ,no cyanosis Skin: Right gluteal wound covered with dressing, drains   Data Reviewed: I have personally reviewed following labs and imaging studies  CBC: Recent Labs  Lab 06/25/21 0201 06/25/21 0228 06/26/21 0645 06/28/21 0911  WBC 21.3*  --  12.5* 10.4  NEUTROABS 18.0*  --   --   --   HGB 12.8 13.3 10.2* 11.0*  HCT 36.5 39.0 30.0* 31.6*  MCV 86.3  --  87.0 86.8  PLT 250  --  191 279   Basic Metabolic Panel: Recent Labs  Lab 06/25/21 0201 06/25/21 0228 06/26/21 0645  NA 129* 130* 135  K 3.7 3.7 3.8  CL 95* 94* 101  CO2 23  --  27  GLUCOSE 106* 105* 111*  BUN 8 7 8   CREATININE 0.98 0.90 0.88  CALCIUM 9.0  --  8.3*     Recent Results (from the past 240 hour(s))  Blood Culture (routine x 2)     Status: None (Preliminary result)   Collection Time: 06/25/21  2:01 AM   Specimen: BLOOD  Result Value Ref Range Status   Specimen Description BLOOD RIGHT ANTECUBITAL  Final   Special Requests   Final    BOTTLES DRAWN AEROBIC AND ANAEROBIC Blood Culture adequate volume   Culture   Final    NO GROWTH 3 DAYS Performed at [facility], [address] [place], [place]    Report Status PENDING  Incomplete  Resp Panel by RT-PCR (Flu A&B, Covid) Anterior Nasal Swab     Status: None   Collection Time: 06/25/21  3:35 AM   Specimen: Anterior Nasal Swab  Result Value Ref Range Status   SARS Coronavirus 2 by RT PCR NEGATIVE NEGATIVE Final    Comment: (NOTE) SARS-CoV-2 target nucleic acids are NOT DETECTED.  The SARS-CoV-2 RNA is generally detectable in upper respiratory specimens during the acute phase of infection. The lowest concentration of SARS-CoV-2 viral copies this assay can detect is 138 copies/mL. A negative result does not preclude SARS-Cov-2 infection and should not be used as the sole basis for treatment or other patient management decisions. A negative result  may occur with  improper specimen collection/handling, submission of specimen other than nasopharyngeal swab, presence of viral mutation(s) within the areas targeted by this assay, and inadequate number of viral copies(<138 copies/mL). A negative result must be combined with clinical observations, patient history, and epidemiological information. The expected result is Negative.  Fact Sheet for Patients:  08/25/21  Fact Sheet for Healthcare Providers:  BloggerCourse.com  This test is no t yet approved or cleared by the [employer] FDA and  has been authorized for detection and/or diagnosis of SARS-CoV-2 by FDA under  an Emergency Use Authorization (EUA). This EUA will remain  in effect (meaning this test can be used) for the duration of the COVID-19 declaration under Section 564(b)(1) of the Act, 21 U.S.C.section 360bbb-3(b)(1), unless the authorization is terminated  or revoked sooner.       Influenza A by PCR NEGATIVE NEGATIVE Final   Influenza B by PCR NEGATIVE NEGATIVE Final    Comment: (NOTE) The Xpert Xpress SARS-CoV-2/FLU/RSV plus assay is intended as an aid in the diagnosis of influenza from Nasopharyngeal swab specimens and should not be used as a sole basis for treatment. Nasal washings and aspirates are unacceptable for Xpert Xpress SARS-CoV-2/FLU/RSV testing.  Fact Sheet for Patients: EntrepreneurPulse.com.au  Fact Sheet for Healthcare Providers: IncredibleEmployment.be  This test is not yet approved or cleared by the [place] FDA and has been authorized for detection and/or diagnosis of SARS-CoV-2 by FDA under an Emergency Use Authorization (EUA). This EUA will remain in effect (meaning this test can be used) for the duration of the COVID-19 declaration under Section 564(b)(1) of the Act, 21 U.S.C. section 360bbb-3(b)(1), unless the authorization is terminated  or revoked.  Performed at [facility], [place] [address], [place], [place] [address]   Aerobic Culture w Gram Stain (superficial specimen)     Status: None   Collection Time: 06/25/21  5:00 PM   Specimen: Abscess  Result Value Ref Range Status   Specimen Description ABSCESS  Final   Special Requests NONE  Final   Gram Stain   Final    NO WBC SEEN MODERATE GRAM POSITIVE COCCI IN CLUSTERS Performed at [facility], [address], [place], [place] [address]    Culture   Final    ABUNDANT METHICILLIN RESISTANT STAPHYLOCOCCUS AUREUS   Report Status 06/27/2021 FINAL  Final   Organism ID, Bacteria METHICILLIN RESISTANT STAPHYLOCOCCUS AUREUS  Final      Susceptibility   Methicillin resistant staphylococcus aureus - MIC*    CIPROFLOXACIN >=8 RESISTANT Resistant     ERYTHROMYCIN >=8 RESISTANT Resistant     GENTAMICIN <=0.5 SENSITIVE Sensitive     OXACILLIN >=4 RESISTANT Resistant     TETRACYCLINE >=16 RESISTANT Resistant     VANCOMYCIN 1 SENSITIVE Sensitive     TRIMETH/SULFA >=320 RESISTANT Resistant     CLINDAMYCIN <=0.25 SENSITIVE Sensitive     RIFAMPIN <=0.5 SENSITIVE Sensitive     Inducible Clindamycin NEGATIVE Sensitive     * ABUNDANT METHICILLIN RESISTANT STAPHYLOCOCCUS AUREUS  Blood Culture (routine x 2)     Status: None (Preliminary result)   Collection Time: 06/25/21  6:49 PM   Specimen: BLOOD  Result Value Ref Range Status   Specimen Description BLOOD LEFT ANTECUBITAL  Final   Special Requests   Final    BOTTLES DRAWN AEROBIC AND ANAEROBIC Blood Culture adequate volume   Culture   Final    NO GROWTH 3 DAYS Performed at [facility], [address], [place], [place] [address]    Report Status PENDING  Incomplete  Urine Culture     Status: None   Collection Time: 06/25/21  6:49 PM   Specimen: In/Out Cath Urine  Result Value Ref Range Status   Specimen Description IN/OUT CATH URINE  Final   Special Requests NONE  Final   Culture   Final    NO  GROWTH Performed at [facility], [place] [address] [place], [place] [address]    Report Status 06/26/2021 FINAL  Final  Surgical pcr  screen     Status: Abnormal   Collection Time: 06/25/21  9:43 PM   Specimen: Anterior Nasal Swab  Result Value Ref Range Status   MRSA, PCR POSITIVE (A) NEGATIVE Final    Comment: RESULT CALLED TO, READ BACK BY AND VERIFIED WITH: E CASTRO,RN@0044  06/26/21 [place]    Staphylococcus aureus POSITIVE (A) NEGATIVE Final    Comment: (NOTE) The Xpert SA Assay (FDA approved for NASAL specimens in patients 35 years of age and older), is one component of a comprehensive surveillance program. It is not intended to diagnose infection nor to guide or monitor treatment. Performed at [facility], [place] [address], [place], [place] [address]   Aerobic/Anaerobic Culture w Gram Stain (surgical/deep wound)     Status: None (Preliminary result)   Collection Time: 06/26/21  9:50 AM   Specimen: PATH Other; Body Fluid  Result Value Ref Range Status   Specimen Description BUTTOCKS RIGHT  Final   Special Requests NONE  Final   Gram Stain   Final    FEW WBC PRESENT,BOTH PMN AND MONONUCLEAR RARE GRAM POSITIVE COCCI IN PAIRS    Culture   Final    ABUNDANT STAPHYLOCOCCUS AUREUS SUSCEPTIBILITIES TO FOLLOW Performed at [facility], [place] [address], [place], [place] [address]    Report Status PENDING  Incomplete     Radiology Studies: No results found.  Scheduled Meds:  acetaminophen  1,000 mg Oral Q6H   Chlorhexidine Gluconate Cloth  6 each Topical Q0600   cloNIDine  0.1 mg Oral BH-qamhs   Followed by   Derrill Memo ON 06/29/2021] cloNIDine  0.1 mg Oral QAC breakfast   enoxaparin (LOVENOX) injection  40 mg Subcutaneous [id number]   folic acid  1 mg Oral Daily   multivitamin with minerals  1 tablet Oral Daily   mupirocin ointment  1 application  Nasal BID   nicotine  14 mg Transdermal Daily   sodium chloride flush  3 mL Intravenous Q12H   thiamine  100 mg Oral Daily    Or   thiamine  100 mg Intravenous Daily   Continuous Infusions:  lactated ringers 75 mL/hr at 06/27/21 2322     LOS: 3 days   Shelly Coss, MD [facility] P6/15/2023, 11:46 AM
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
## 2021-06-28 NOTE — Care Management Important Message (Signed)
Important Message  Patient Details  Name: [name] MRN: [id number] Date of Birth: 27-Dec-1980   Medicare Important Message Given:  Yes   Patient has a contact precaution order in place will mail the IM to the patient home address  [name] 06/28/2021, 2:35 PM

## 2021-06-29 DIAGNOSIS — L0231 Cutaneous abscess of buttock: Secondary | ICD-10-CM | POA: Diagnosis not present

## 2021-06-29 DIAGNOSIS — L03317 Cellulitis of buttock: Secondary | ICD-10-CM | POA: Diagnosis not present

## 2021-06-29 LAB — CBC
HCT: 31.1 % — ABNORMAL LOW (ref 36.0–46.0)
Hemoglobin: 10.7 g/dL — ABNORMAL LOW (ref 12.0–15.0)
MCH: 29.6 pg (ref 26.0–34.0)
MCHC: 34.4 g/dL (ref 30.0–36.0)
MCV: 85.9 fL (ref 80.0–100.0)
Platelets: 305 10*3/uL (ref 150–400)
RBC: 3.62 MIL/uL — ABNORMAL LOW (ref 3.87–5.11)
RDW: 13.2 % (ref 11.5–15.5)
WBC: 10 10*3/uL (ref 4.0–10.5)
nRBC: 0 % (ref 0.0–0.2)

## 2021-06-29 MED ORDER — FOLIC ACID 1 MG PO TABS: 1.0000 mg | ORAL_TABLET | Freq: Every day | ORAL | 1 refills | Status: DC

## 2021-06-29 MED ORDER — IBUPROFEN 600 MG PO TABS: 600.0000 mg | ORAL_TABLET | Freq: Three times a day (TID) | ORAL | 0 refills | Status: DC | PRN

## 2021-06-29 MED ORDER — THIAMINE HCL 100 MG PO TABS: 100.0000 mg | ORAL_TABLET | Freq: Every day | ORAL | 1 refills | Status: DC

## 2021-06-29 NOTE — Progress Notes (Signed)
Pt refused shower, sitz bath at this time. RN educated pt on importance for wound healing. Pt continues to refuse.

## 2021-06-29 NOTE — Plan of Care (Signed)
  Problem: Health Behavior/Discharge Planning: Goal: Ability to manage health-related needs will improve Outcome: Progressing   Problem: Clinical Measurements: Goal: Ability to maintain clinical measurements within normal limits will improve Outcome: Progressing Goal: Will remain free from infection Outcome: Progressing   Problem: Coping: Goal: Level of anxiety will decrease Outcome: Progressing

## 2021-06-29 NOTE — Discharge Summary (Signed)
Physician Discharge Summary  [name] CBJ:[id number] DOB: 03-11-1980 DOA: 06/25/2021  PCP: Pcp, No  Admit date: 06/25/2021 Discharge date: 06/29/2021  Admitted From: Home Disposition:  Home  Discharge Condition:Stable CODE STATUS:FULL Diet recommendation: Regular   Brief/Interim Summary:  Patient is a 41 year old female with history of polysubstance abuse, anxiety, depression, bipolar disorder who presented with right buttock abscess.  Patient reported that she got the abscess after insect bite her.  General surgery consulted and she underwent I&D.  Wound culture showed MRSA.  ID consulted and she was given  oritavancin on 6/14 ,1 dose.  As per surgery, cellulitis has resolved and they cleared her for discharge.  Set up an appointment as an outpatient.  Medically stable for discharge today.  Following problems were addressed during hospitalization:  Cellulitis/abscess of the right buttock: Presented with swelling, pain, redness of the right buttock.  Reported an insect bite.  Patient has history polysubstance abuse.  Denies any IV drug use. Started on broad-spectrum antibiotics.  Underwent I&D on 6/13 by general surgery.On penrose dressing.  Wound cultures showed MRSA.  She was on vancomycin.  Given oritavancin on 6/14 ,1 dose.  Surgery cleared for discharge and follow up as an outpatient   Polysubstance abuse: History of alcohol intake, amphetamines, cocaine, opiates.  Monitor for withdrawal, DTs.  Started on CIWA protocol, folic acid, vitamins.  Also on clonidine detox.  Counseled for cessation.  Diabetes.  Discharged on thiamine and folic acid.   Smoker: Counseled to quit  Discharge Diagnoses:  Principal Problem:   Cellulitis and abscess of buttock Active Problems:   Tobacco abuse   Polysubstance abuse (HCC)   MRSA infection    Discharge Instructions  Discharge Instructions     Diet - low sodium heart healthy   Complete by: As directed    Diet general   Complete by: As  directed    Discharge instructions   Complete by: As directed    1)Please stop substance abuse. Take prescribed medications as instructed 2)Follow up with general surgery on 6/27. 3)Leave penrose in place.sitz bathes 4x/day, may shower shower or soak backside at least daily if not more frequently at home.   Increase activity slowly   Complete by: As directed    No wound care   Complete by: As directed       Allergies as of 06/29/2021       Reactions   Cephalexin Hives   Pt states she can take ampicillin without problems        Medication List     TAKE these medications    folic acid 1 MG tablet Commonly known as: FOLVITE Take 1 tablet (1 mg total) by mouth daily. Start taking on: June 30, 2021   ibuprofen 600 MG tablet Commonly known as: ADVIL Take 1 tablet (600 mg total) by mouth every 8 (eight) hours as needed for headache, mild pain or moderate pain.   thiamine 100 MG tablet Take 1 tablet (100 mg total) by mouth daily. Start taking on: June 30, 2021        Follow-up Information     [name], [name], [place] Follow up on 07/10/2021.   Specialty: General Surgery Why: 11:15am, arrive by 10:45am for paperwork and check in process Contact information: [address] [facility] [place] [address] [phone]                Allergies  Allergen Reactions   Cephalexin Hives    Pt states  she can take ampicillin without problems    Consultations: Surgery, ID   Procedures/Studies: CT ABDOMEN PELVIS W CONTRAST  Result Date: 06/25/2021 CLINICAL DATA:  Sepsis.  Abscess in the buttock. EXAM: CT ABDOMEN AND PELVIS WITH CONTRAST TECHNIQUE: Multidetector CT imaging of the abdomen and pelvis was performed using the standard protocol following bolus administration of intravenous contrast. RADIATION DOSE REDUCTION: This exam was performed according to the departmental dose-optimization program which includes automated exposure  control, adjustment of the mA and/or kV according to patient size and/or use of iterative reconstruction technique. CONTRAST:  80mL OMNIPAQUE IOHEXOL 300 MG/ML  SOLN COMPARISON:  CT abdomen pelvis dated 02/02/2012. FINDINGS: Lower chest: The visualized lung bases are clear. No intra-abdominal free air or free fluid. Hepatobiliary: No focal liver abnormality is seen. No gallstones, gallbladder wall thickening, or biliary dilatation. Pancreas: Unremarkable. No pancreatic ductal dilatation or surrounding inflammatory changes. Spleen: Normal in size without focal abnormality. Adrenals/Urinary Tract: The adrenal glands unremarkable. The kidneys, visualized ureters, and urinary bladder appear unremarkable. Stomach/Bowel: There is no bowel obstruction or active inflammation. Normal appendix. Vascular/Lymphatic: The abdominal aorta and IVC are unremarkable. No portal venous gas. There is no adenopathy. Reproductive: The uterus is grossly unremarkable. No adnexal masses. Other: There is diffuse subcutaneous edema of the right gluteal region with slight thickening of the skin of the right buttock. Findings may represent cellulitis. Clinical correlation is recommended. No drainable fluid collection or abscess. No soft tissue air. Probable mildly reactive right inguinal lymph nodes. Musculoskeletal: No acute osseous pathology. IMPRESSION: Findings may represent cellulitis of the right buttock. No drainable fluid collection or abscess. Electronically Signed   By: Elgie Collard M.D.   On: 06/25/2021 03:47   DG Chest Port 1 View  Result Date: 06/25/2021 CLINICAL DATA:  Questionable sepsis - evaluate for abnormality EXAM: PORTABLE CHEST 1 VIEW COMPARISON:  02/07/2012 FINDINGS: The heart size and mediastinal contours are within normal limits. Both lungs are clear. The visualized skeletal structures are unremarkable. IMPRESSION: No active disease. Electronically Signed   By: Charlett Nose M.D.   On: 06/25/2021 02:00       Subjective: Patient seen and examined at the bedside this morning.  Hemodynamically stable for discharge today  Discharge Exam: Vitals:   06/28/21 2056 06/29/21 0459  BP: 104/78 110/70  Pulse: 79 (!) 55  Resp: 19 18  Temp: 98.4 F (36.9 C) 98.1 F (36.7 C)  SpO2: 96% 95%   Vitals:   06/28/21 0827 06/28/21 1713 06/28/21 2056 06/29/21 0459  BP: (!) 119/98 121/85 104/78 110/70  Pulse: 87 83 79 (!) 55  Resp: Temp: 98.1 F (36.7 C) 98.4 F (36.9 C) 98.4 F (36.9 C) 98.1 F (36.7 C)  TempSrc: Oral Oral Oral Oral  SpO2: 99% 99% 96% 95%  Weight:      Height:        General: Pt is alert, awake, not in acute distress Cardiovascular: RRR, S1/S2 +, no rubs, no gallops Respiratory: CTA bilaterally, no wheezing, no rhonchi Abdominal: Soft, NT, ND, bowel sounds + Extremities: no edema, no cyanosis    The results of significant diagnostics from this hospitalization (including imaging, microbiology, ancillary and laboratory) are listed below for reference.     Microbiology: Recent Results (from the past 240 hour(s))  Blood Culture (routine x 2)     Status: None (Preliminary result)   Collection Time: 06/25/21  2:01 AM   Specimen: BLOOD  Result Value Ref Range Status   Specimen  Description BLOOD RIGHT ANTECUBITAL  Final   Special Requests   Final    BOTTLES DRAWN AEROBIC AND ANAEROBIC Blood Culture adequate volume   Culture   Final    NO GROWTH 3 DAYS Performed at [facility], [address] [place], [place] [address]    Report Status PENDING  Incomplete  Resp Panel by RT-PCR (Flu A&B, Covid) Anterior Nasal Swab     Status: None   Collection Time: 06/25/21  3:35 AM   Specimen: Anterior Nasal Swab  Result Value Ref Range Status   SARS Coronavirus 2 by RT PCR NEGATIVE NEGATIVE Final    Comment: (NOTE) SARS-CoV-2 target nucleic acids are NOT DETECTED.  The SARS-CoV-2 RNA is generally detectable in upper respiratory specimens during the acute  phase of infection. The lowest concentration of SARS-CoV-2 viral copies this assay can detect is 138 copies/mL. A negative result does not preclude SARS-Cov-2 infection and should not be used as the sole basis for treatment or other patient management decisions. A negative result may occur with  improper specimen collection/handling, submission of specimen other than nasopharyngeal swab, presence of viral mutation(s) within the areas targeted by this assay, and inadequate number of viral copies(<138 copies/mL). A negative result must be combined with clinical observations, patient history, and epidemiological information. The expected result is Negative.  Fact Sheet for Patients:  BloggerCourse.com  Fact Sheet for Healthcare Providers:  SeriousBroker.it  This test is no t yet approved or cleared by the [place] FDA and  has been authorized for detection and/or diagnosis of SARS-CoV-2 by FDA under an Emergency Use Authorization (EUA). This EUA will remain  in effect (meaning this test can be used) for the duration of the COVID-19 declaration under Section 564(b)(1) of the Act, 21 U.S.C.section 360bbb-3(b)(1), unless the authorization is terminated  or revoked sooner.       Influenza A by PCR NEGATIVE NEGATIVE Final   Influenza B by PCR NEGATIVE NEGATIVE Final    Comment: (NOTE) The Xpert Xpress SARS-CoV-2/FLU/RSV plus assay is intended as an aid in the diagnosis of influenza from Nasopharyngeal swab specimens and should not be used as a sole basis for treatment. Nasal washings and aspirates are unacceptable for Xpert Xpress SARS-CoV-2/FLU/RSV testing.  Fact Sheet for Patients: BloggerCourse.com  Fact Sheet for Healthcare Providers: SeriousBroker.it  This test is not yet approved or cleared by the [place] FDA and has been authorized for detection and/or diagnosis of  SARS-CoV-2 by FDA under an Emergency Use Authorization (EUA). This EUA will remain in effect (meaning this test can be used) for the duration of the COVID-19 declaration under Section 564(b)(1) of the Act, 21 U.S.C. section 360bbb-3(b)(1), unless the authorization is terminated or revoked.  Performed at [facility], [address], [place], [place] [address]   Aerobic Culture w Gram Stain (superficial specimen)     Status: None   Collection Time: 06/25/21  5:00 PM   Specimen: Abscess  Result Value Ref Range Status   Specimen Description ABSCESS  Final   Special Requests NONE  Final   Gram Stain   Final    NO WBC SEEN MODERATE GRAM POSITIVE COCCI IN CLUSTERS Performed at [facility], [address], [place], [place] [address]    Culture   Final    ABUNDANT METHICILLIN RESISTANT STAPHYLOCOCCUS AUREUS   Report Status 06/27/2021 FINAL  Final   Organism ID, Bacteria METHICILLIN RESISTANT STAPHYLOCOCCUS AUREUS  Final      Susceptibility  Methicillin resistant staphylococcus aureus - MIC*    CIPROFLOXACIN >=8 RESISTANT Resistant     ERYTHROMYCIN >=8 RESISTANT Resistant     GENTAMICIN <=0.5 SENSITIVE Sensitive     OXACILLIN >=4 RESISTANT Resistant     TETRACYCLINE >=16 RESISTANT Resistant     VANCOMYCIN 1 SENSITIVE Sensitive     TRIMETH/SULFA >=320 RESISTANT Resistant     CLINDAMYCIN <=0.25 SENSITIVE Sensitive     RIFAMPIN <=0.5 SENSITIVE Sensitive     Inducible Clindamycin NEGATIVE Sensitive     * ABUNDANT METHICILLIN RESISTANT STAPHYLOCOCCUS AUREUS  Blood Culture (routine x 2)     Status: None (Preliminary result)   Collection Time: 06/25/21  6:49 PM   Specimen: BLOOD  Result Value Ref Range Status   Specimen Description BLOOD LEFT ANTECUBITAL  Final   Special Requests   Final    BOTTLES DRAWN AEROBIC AND ANAEROBIC Blood Culture adequate volume   Culture   Final    NO GROWTH 3 DAYS Performed at [facility], [address] [place], [place] [address]     Report Status PENDING  Incomplete  Urine Culture     Status: None   Collection Time: 06/25/21  6:49 PM   Specimen: In/Out Cath Urine  Result Value Ref Range Status   Specimen Description IN/OUT CATH URINE  Final   Special Requests NONE  Final   Culture   Final    NO GROWTH Performed at [facility], [address] [place], [place] [address]    Report Status 06/26/2021 FINAL  Final  Surgical pcr screen     Status: Abnormal   Collection Time: 06/25/21  9:43 PM   Specimen: Anterior Nasal Swab  Result Value Ref Range Status   MRSA, PCR POSITIVE (A) NEGATIVE Final    Comment: RESULT CALLED TO, READ BACK BY AND VERIFIED WITH: E CASTRO,RN@0044  06/26/21 MK    Staphylococcus aureus POSITIVE (A) NEGATIVE Final    Comment: (NOTE) The Xpert SA Assay (FDA approved for NASAL specimens in patients 43 years of age and older), is one component of a comprehensive surveillance program. It is not intended to diagnose infection nor to guide or monitor treatment. Performed at [facility], [address] [place], [place] [address]   Aerobic/Anaerobic Culture w Gram Stain (surgical/deep wound)     Status: None (Preliminary result)   Collection Time: 06/26/21  9:50 AM   Specimen: PATH Other; Body Fluid  Result Value Ref Range Status   Specimen Description BUTTOCKS RIGHT  Final   Special Requests NONE  Final   Gram Stain   Final    FEW WBC PRESENT,BOTH PMN AND MONONUCLEAR RARE GRAM POSITIVE COCCI IN PAIRS Performed at [facility], [address] [place], [place] [address]    Culture   Final    ABUNDANT METHICILLIN RESISTANT STAPHYLOCOCCUS AUREUS NO ANAEROBES ISOLATED; CULTURE IN PROGRESS FOR 5 DAYS    Report Status PENDING  Incomplete   Organism ID, Bacteria METHICILLIN RESISTANT STAPHYLOCOCCUS AUREUS  Final      Susceptibility   Methicillin resistant staphylococcus aureus - MIC*    CIPROFLOXACIN >=8 RESISTANT Resistant     ERYTHROMYCIN >=8 RESISTANT Resistant      GENTAMICIN <=0.5 SENSITIVE Sensitive     OXACILLIN >=4 RESISTANT Resistant     TETRACYCLINE >=16 RESISTANT Resistant     VANCOMYCIN 1 SENSITIVE Sensitive     TRIMETH/SULFA >=320 RESISTANT Resistant     CLINDAMYCIN <=0.25 SENSITIVE Sensitive  RIFAMPIN <=0.5 SENSITIVE Sensitive     Inducible Clindamycin NEGATIVE Sensitive     * ABUNDANT METHICILLIN RESISTANT STAPHYLOCOCCUS AUREUS     Labs: BNP (last 3 results) No results for input(s): "BNP" in the last 8760 hours. Basic Metabolic Panel: Recent Labs  Lab 06/25/21 0201 06/25/21 0228 06/26/21 0645  NA 129* 130* 135  K 3.7 3.7 3.8  CL 95* 94* 101  CO2 23  --  27  GLUCOSE 106* 105* 111*  BUN CREATININE 0.98 0.90 0.88  CALCIUM 9.0  --  8.3*   Liver Function Tests: Recent Labs  Lab 06/25/21 0201  AST 18  ALT 13  ALKPHOS 62  BILITOT 1.6*  PROT 7.5  ALBUMIN 3.7   No results for input(s): "LIPASE", "AMYLASE" in the last 168 hours. No results for input(s): "AMMONIA" in the last 168 hours. CBC: Recent Labs  Lab 06/25/21 0201 06/25/21 0228 06/26/21 0645 06/28/21 0911 06/29/21 0318  WBC 21.3*  --  12.5* 10.4 10.0  NEUTROABS 18.0*  --   --   --   --   HGB 12.8 13.3 10.2* 11.0* 10.7*  HCT 36.5 39.0 30.0* 31.6* 31.1*  MCV 86.3  --  87.0 86.8 85.9  PLT 250  --  191 279 305   Cardiac Enzymes: No results for input(s): "CKTOTAL", "CKMB", "CKMBINDEX", "TROPONINI" in the last 168 hours. BNP: Invalid input(s): "POCBNP" CBG: No results for input(s): "GLUCAP" in the last 168 hours. D-Dimer No results for input(s): "DDIMER" in the last 72 hours. Hgb A1c No results for input(s): "HGBA1C" in the last 72 hours. Lipid Profile No results for input(s): "CHOL", "HDL", "LDLCALC", "TRIG", "CHOLHDL", "LDLDIRECT" in the last 72 hours. Thyroid function studies No results for input(s): "TSH", "T4TOTAL", "T3FREE", "THYROIDAB" in the last 72 hours.  Invalid input(s): "FREET3" Anemia work up No results for input(s):  "VITAMINB12", "FOLATE", "FERRITIN", "TIBC", "IRON", "RETICCTPCT" in the last 72 hours. Urinalysis    Component Value Date/Time   COLORURINE YELLOW 11/19/2012 2228   APPEARANCEUR CLOUDY (A) 11/19/2012 2228   LABSPEC 1.014 11/19/2012 2228   PHURINE 6.0 11/19/2012 2228   GLUCOSEU NEGATIVE 11/19/2012 2228   HGBUR NEGATIVE 11/19/2012 2228   BILIRUBINUR NEGATIVE 11/19/2012 2228   KETONESUR NEGATIVE 11/19/2012 2228   PROTEINUR NEGATIVE 11/19/2012 2228   UROBILINOGEN 0.2 11/19/2012 2228   NITRITE NEGATIVE 11/19/2012 2228   LEUKOCYTESUR NEGATIVE 11/19/2012 2228   Sepsis Labs Recent Labs  Lab 06/25/21 0201 06/26/21 0645 06/28/21 0911 06/29/21 0318  WBC 21.3* 12.5* 10.4 10.0   Microbiology Recent Results (from the past 240 hour(s))  Blood Culture (routine x 2)     Status: None (Preliminary result)   Collection Time: 06/25/21  2:01 AM   Specimen: BLOOD  Result Value Ref Range Status   Specimen Description BLOOD RIGHT ANTECUBITAL  Final   Special Requests   Final    BOTTLES DRAWN AEROBIC AND ANAEROBIC Blood Culture adequate volume   Culture   Final    NO GROWTH 3 DAYS Performed at [facility], [address], [place], [place] [address]    Report Status PENDING  Incomplete  Resp Panel by RT-PCR (Flu A&B, Covid) Anterior Nasal Swab     Status: None   Collection Time: 06/25/21  3:35 AM   Specimen: Anterior Nasal Swab  Result Value Ref Range Status   SARS Coronavirus 2 by RT PCR NEGATIVE NEGATIVE Final    Comment: (NOTE) SARS-CoV-2 target nucleic acids are NOT DETECTED.  The  SARS-CoV-2 RNA is generally detectable in upper respiratory specimens during the acute phase of infection. The lowest concentration of SARS-CoV-2 viral copies this assay can detect is 138 copies/mL. A negative result does not preclude SARS-Cov-2 infection and should not be used as the sole basis for treatment or other patient management decisions. A negative result may occur with  improper specimen  collection/handling, submission of specimen other than nasopharyngeal swab, presence of viral mutation(s) within the areas targeted by this assay, and inadequate number of viral copies(<138 copies/mL). A negative result must be combined with clinical observations, patient history, and epidemiological information. The expected result is Negative.  Fact Sheet for Patients:  BloggerCourse.com  Fact Sheet for Healthcare Providers:  SeriousBroker.it  This test is no t yet approved or cleared by the [place] FDA and  has been authorized for detection and/or diagnosis of SARS-CoV-2 by FDA under an Emergency Use Authorization (EUA). This EUA will remain  in effect (meaning this test can be used) for the duration of the COVID-19 declaration under Section 564(b)(1) of the Act, 21 U.S.C.section 360bbb-3(b)(1), unless the authorization is terminated  or revoked sooner.       Influenza A by PCR NEGATIVE NEGATIVE Final   Influenza B by PCR NEGATIVE NEGATIVE Final    Comment: (NOTE) The Xpert Xpress SARS-CoV-2/FLU/RSV plus assay is intended as an aid in the diagnosis of influenza from Nasopharyngeal swab specimens and should not be used as a sole basis for treatment. Nasal washings and aspirates are unacceptable for Xpert Xpress SARS-CoV-2/FLU/RSV testing.  Fact Sheet for Patients: BloggerCourse.com  Fact Sheet for Healthcare Providers: SeriousBroker.it  This test is not yet approved or cleared by the [place] FDA and has been authorized for detection and/or diagnosis of SARS-CoV-2 by FDA under an Emergency Use Authorization (EUA). This EUA will remain in effect (meaning this test can be used) for the duration of the COVID-19 declaration under Section 564(b)(1) of the Act, 21 U.S.C. section 360bbb-3(b)(1), unless the authorization is terminated or revoked.  Performed at [facility], [address], [place], [place] [address]   Aerobic Culture w Gram Stain (superficial specimen)     Status: None   Collection Time: 06/25/21  5:00 PM   Specimen: Abscess  Result Value Ref Range Status   Specimen Description ABSCESS  Final   Special Requests NONE  Final   Gram Stain   Final    NO WBC SEEN MODERATE GRAM POSITIVE COCCI IN CLUSTERS Performed at [facility], [address] [place], [place] [address]    Culture   Final    ABUNDANT METHICILLIN RESISTANT STAPHYLOCOCCUS AUREUS   Report Status 06/27/2021 FINAL  Final   Organism ID, Bacteria METHICILLIN RESISTANT STAPHYLOCOCCUS AUREUS  Final      Susceptibility   Methicillin resistant staphylococcus aureus - MIC*    CIPROFLOXACIN >=8 RESISTANT Resistant     ERYTHROMYCIN >=8 RESISTANT Resistant     GENTAMICIN <=0.5 SENSITIVE Sensitive     OXACILLIN >=4 RESISTANT Resistant     TETRACYCLINE >=16 RESISTANT Resistant     VANCOMYCIN 1 SENSITIVE Sensitive     TRIMETH/SULFA >=320 RESISTANT Resistant     CLINDAMYCIN <=0.25 SENSITIVE Sensitive     RIFAMPIN <=0.5 SENSITIVE Sensitive     Inducible Clindamycin NEGATIVE Sensitive     * ABUNDANT METHICILLIN RESISTANT STAPHYLOCOCCUS AUREUS  Blood Culture (routine x 2)     Status: None (Preliminary result)   Collection Time: 06/25/21  6:49 PM  Specimen: BLOOD  Result Value Ref Range Status   Specimen Description BLOOD LEFT ANTECUBITAL  Final   Special Requests   Final    BOTTLES DRAWN AEROBIC AND ANAEROBIC Blood Culture adequate volume   Culture   Final    NO GROWTH 3 DAYS Performed at [facility], [address], [place], [place] [address]    Report Status PENDING  Incomplete  Urine Culture     Status: None   Collection Time: 06/25/21  6:49 PM   Specimen: In/Out Cath Urine  Result Value Ref Range Status   Specimen Description IN/OUT CATH URINE  Final   Special Requests NONE  Final   Culture   Final    NO GROWTH Performed at [facility], [address] [place], [place] [address]    Report Status 06/26/2021 FINAL  Final  Surgical pcr screen     Status: Abnormal   Collection Time: 06/25/21  9:43 PM   Specimen: Anterior Nasal Swab  Result Value Ref Range Status   MRSA, PCR POSITIVE (A) NEGATIVE Final    Comment: RESULT CALLED TO, READ BACK BY AND VERIFIED WITH: E CASTRO,RN@0044  06/26/21 MK    Staphylococcus aureus POSITIVE (A) NEGATIVE Final    Comment: (NOTE) The Xpert SA Assay (FDA approved for NASAL specimens in patients 67 years of age and older), is one component of a comprehensive surveillance program. It is not intended to diagnose infection nor to guide or monitor treatment. Performed at [facility], [address] [place], [place] [address]   Aerobic/Anaerobic Culture w Gram Stain (surgical/deep wound)     Status: None (Preliminary result)   Collection Time: 06/26/21  9:50 AM   Specimen: PATH Other; Body Fluid  Result Value Ref Range Status   Specimen Description BUTTOCKS RIGHT  Final   Special Requests NONE  Final   Gram Stain   Final    FEW WBC PRESENT,BOTH PMN AND MONONUCLEAR RARE GRAM POSITIVE COCCI IN PAIRS Performed at [facility], [address] [place], [place] [address]    Culture   Final    ABUNDANT METHICILLIN RESISTANT STAPHYLOCOCCUS AUREUS NO ANAEROBES ISOLATED; CULTURE IN PROGRESS FOR 5 DAYS    Report Status PENDING  Incomplete   Organism ID, Bacteria METHICILLIN RESISTANT STAPHYLOCOCCUS AUREUS  Final      Susceptibility   Methicillin resistant staphylococcus aureus - MIC*    CIPROFLOXACIN >=8 RESISTANT Resistant     ERYTHROMYCIN >=8 RESISTANT Resistant     GENTAMICIN <=0.5 SENSITIVE Sensitive     OXACILLIN >=4 RESISTANT Resistant     TETRACYCLINE >=16 RESISTANT Resistant     VANCOMYCIN 1 SENSITIVE Sensitive     TRIMETH/SULFA >=320 RESISTANT Resistant     CLINDAMYCIN <=0.25 SENSITIVE Sensitive     RIFAMPIN <=0.5 SENSITIVE Sensitive     Inducible Clindamycin NEGATIVE  Sensitive     * ABUNDANT METHICILLIN RESISTANT STAPHYLOCOCCUS AUREUS    Please note: You were cared for by a hospitalist during your hospital stay. Once you are discharged, your primary care physician will handle any further medical issues. Please note that NO REFILLS for any discharge medications will be authorized once you are discharged, as it is imperative that you return to your primary care physician (or establish a relationship with a primary care physician if you do not have one) for your post hospital discharge needs so that they can reassess your need for medications and monitor your lab values.  Time coordinating discharge: 40 minutes  SIGNED:   Burnadette Pop, MD  [facility] 06/29/2021, 10:23 AM Pager [phone]  If 7PM-7AM, please contact night-coverage [email or web address] Password [email or web address]

## 2021-06-29 NOTE — Progress Notes (Signed)
AVS given and reviewed with pt. Medications discussed. Wound care discussed. All questions answered to satisfaction. Pt verbalized understanding of information given.

## 2021-06-29 NOTE — Progress Notes (Signed)
Brief ID Note:   Patient's cellulitis has resolved overnight. Likely due to high inoculum/toxin from infection treatment.  Not bacteremic.    She has antibiotics in her system for reliable 10-14 days from 6/14 dose and needs no further PO antibiotics outpatient.  Of note, her MRSA is resistant to bactrim and doxy; if further treatment needed with abx beyond 2 weeks (unlikely since well debrided) would recommend linezolid 600 mg BID (~$10 for 5-d supply when we looked into this earlier with insurance).   She can discharge with outpatient follow up with surgery team.   Rexene Alberts, MSN, NP-C [facility]  [email or web address] Pager: [phone] Office: [phone] [facility] Main Line: [phone] *Secure Chat Communication Welcome

## 2021-06-29 NOTE — Progress Notes (Signed)
Pt refused staff escort off of unit to main entrance. Pt chose to walk off of the unit unassisted.

## 2021-06-29 NOTE — Progress Notes (Signed)
3 Days Post-Op  Subjective: Laying in bed.  No sitz bathes the last 2 days despite order for scheduled sitz bathes.    ROS: See above, otherwise other systems negative  Objective: Vital signs in last 24 hours: Temp:  [98.1 F (36.7 C)-98.4 F (36.9 C)] 98.1 F (36.7 C) (06/16 0459) Pulse Rate:  [55-83] 55 (06/16 0459) Resp:  [18-19] 18 (06/16 0459) BP: (104-121)/(70-85) 110/70 (06/16 0459) SpO2:  [95 %-99 %] 95 % (06/16 0459) Last BM Date : 06/26/21  Intake/Output from previous day: 06/15 0701 - 06/16 0700 In: 1208.8 [P.O.:717; I.V.:491.8] Out: -  Intake/Output this shift: No intake/output data recorded.  PE: Buttock:  cellulitis has completely resolved and looks much better.  Some purulent drainage from wound, but wound open with penrose in place   Lab Results:  Recent Labs    06/28/21 0911 06/29/21 0318  WBC 10.4 10.0  HGB 11.0* 10.7*  HCT 31.6* 31.1*  PLT 279 305   BMET No results for input(s): "NA", "K", "CL", "CO2", "GLUCOSE", "BUN", "CREATININE", "CALCIUM" in the last 72 hours.  PT/INR No results for input(s): "LABPROT", "INR" in the last 72 hours.  CMP     Component Value Date/Time   NA 135 06/26/2021 0645   K 3.8 06/26/2021 0645   CL 101 06/26/2021 0645   CO2 27 06/26/2021 0645   GLUCOSE 111 (H) 06/26/2021 0645   BUN 8 06/26/2021 0645   CREATININE 0.88 06/26/2021 0645   CALCIUM 8.3 (L) 06/26/2021 0645   PROT 7.5 06/25/2021 0201   ALBUMIN 3.7 06/25/2021 0201   AST 18 06/25/2021 0201   ALT 13 06/25/2021 0201   ALKPHOS 62 06/25/2021 0201   BILITOT 1.6 (H) 06/25/2021 0201   GFRNONAA >60 06/26/2021 0645   GFRAA >90 07/26/2013 1846   Lipase     Component Value Date/Time   LIPASE 40 10/15/2012 1930       Studies/Results: No results found.  Anti-infectives: Anti-infectives (From admission, onward)    Start     Dose/Rate Route Frequency Ordered Stop   06/27/21 1730  Oritavancin Diphosphate (ORBACTIV) 1,200 mg in dextrose 5 % IVPB         1,200 mg 333.3 mL/hr over 180 Minutes Intravenous Once 06/27/21 1621 06/27/21 2255   06/26/21 0500  levofloxacin (LEVAQUIN) IVPB 750 mg  Status:  Discontinued        750 mg 100 mL/hr over 90 Minutes Intravenous Every 24 hours 06/25/21 1409 06/25/21 1519   06/25/21 2300  piperacillin-tazobactam (ZOSYN) IVPB 3.375 g  Status:  Discontinued        3.375 g 12.5 mL/hr over 240 Minutes Intravenous Every 8 hours 06/25/21 1519 06/27/21 1336   06/25/21 1800  vancomycin (VANCOREADY) IVPB 750 mg/150 mL  Status:  Discontinued        750 mg 150 mL/hr over 60 Minutes Intravenous Every 12 hours 06/25/21 1409 06/27/21 1621   06/25/21 0200  vancomycin (VANCOREADY) IVPB 1250 mg/250 mL        1,250 mg 166.7 mL/hr over 90 Minutes Intravenous  Once 06/25/21 0146 06/25/21 0727   06/25/21 0130  vancomycin (VANCOCIN) IVPB 1000 mg/200 mL premix  Status:  Discontinued        1,000 mg 200 mL/hr over 60 Minutes Intravenous  Once 06/25/21 0127 06/25/21 0146   06/25/21 0130  levofloxacin (LEVAQUIN) IVPB 750 mg        750 mg 100 mL/hr over 90 Minutes Intravenous  Once 06/25/21 0127 06/25/21 0317  Assessment/Plan POD 3, s/p I&D of R gluteal abscess with penrose placement, Dr. Andrey Campanile 6/13 -cx MRSA  Vanc zosyn stopped 6/14 and given a dose of Orbactiv -Leave penrose in place -sitz bathes 4x/day, may shower -narcotics held due to concern for using personal drugs.  Tylenol and ibuprofen in place.   -D/W primary service. -cellulitis essentially resolved today. -would will clean up with showers -she is surgically stable for DC today from our standpoint.  WBC normal -she has follow up in place.  She should shower or soak her backside at least daily if not more frequently at home. -I will not prescribe narcotics to her given her significant use of recreational/illegal drugs.  She is high risk for overdose.  FEN - regular VTE - Lovenox ID - vanc/zosyn stopped 6/14, Ocbactiv 6/14  Polysubstance  abuse ETOH abuse Schizophrenia Bipolar d/o Anxiety/depression    LOS: 4 days    [name] , [facility] 06/29/2021, 9:38 AM Please see Amion for pager number during day hours 7:00am-4:30pm or 7:00am -11:30am on weekends

## 2021-06-29 NOTE — TOC Progression Note (Signed)
Transition of Care [facility]) - Initial/Assessment Note    Patient Details  Name: [name] MRN: [id number] Date of Birth: 1981/01/14  [facility] of [facility]) CM/SW Contact:    Ralene Bathe, LCSWA Phone Number: 06/29/2021, 11:52 AM  Clinical Narrative:                 CSW informed the patient and mother of [facility] services in [place].  The patient can walk in and be assessed and the facility would determine if patient can be accepted.  The mother expressed concerns with the patient discharging today.  CSW informed the MD and the plan remains for patient to d/c.  CSW informed the family of the right to appeal.  They declined to appeal.  Patient's mother reported that she will pick the patient up from the hospital between 16:30 and 1700.  The mother did not state whether the patient would be transported to the inpatient facility to be assessed.         Patient Goals and CMS Choice        Expected Discharge Plan and Services           Expected Discharge Date: 06/29/21                                    Prior Living Arrangements/Services                       Activities of Daily Living Home Assistive Devices/Equipment: None ADL Screening (condition at time of admission) Patient's cognitive ability adequate to safely complete daily activities?: Yes Is the patient deaf or have difficulty hearing?: No Does the patient have difficulty seeing, even when wearing glasses/contacts?: No Does the patient have difficulty concentrating, remembering, or making decisions?: No Patient able to express need for assistance with ADLs?: Yes Does the patient have difficulty dressing or bathing?: No Independently performs ADLs?: Yes (appropriate for developmental age) Does the patient have difficulty walking or climbing stairs?: No Weakness of Legs: None Weakness of Arms/Hands: None  Permission Sought/Granted                  Emotional Assessment               Admission diagnosis:  Cellulitis and abscess of buttock [L02.31, L03.317] Polysubstance abuse (HCC) [F19.10] Sepsis (HCC) [A41.9] Abscess and cellulitis of gluteal region [L02.31, L03.317] Sepsis without acute organ dysfunction, due to unspecified organism [facility]) [A41.9] Patient Active Problem List   Diagnosis Date Noted   MRSA infection 06/27/2021   Cellulitis and abscess of buttock 06/25/2021   Polysubstance abuse (HCC) 11/18/2012   Alcohol dependence (HCC) 10/17/2012    Class: Acute   Bipolar disorder (HCC) 02/02/2012   Tobacco abuse 02/02/2012   ETOH abuse 02/02/2012   Scoliosis 02/02/2012   Generalized anxiety disorder 09/18/2010   PCP:  Pcp, No Pharmacy:   [id number] [place], [place] - [address] AT [facility] [address] [place] [address] Phone: [phone] Fax: [phone]     Social Determinants of Health (SDOH) Interventions    Readmission Risk Interventions     No data to display
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
## 2021-06-30 LAB — CULTURE, BLOOD (ROUTINE X 2)
Culture: NO GROWTH
Culture: NO GROWTH
Special Requests: ADEQUATE
Special Requests: ADEQUATE

## 2021-07-01 LAB — AEROBIC/ANAEROBIC CULTURE W GRAM STAIN (SURGICAL/DEEP WOUND)

## 2021-07-06 ENCOUNTER — Encounter (HOSPITAL_COMMUNITY): Payer: Self-pay

## 2021-07-06 ENCOUNTER — Emergency Department (HOSPITAL_COMMUNITY)
Admission: EM | Admit: 2021-07-06 | Discharge: 2021-07-06 | Discharge status: Against Medical Advice | Payer: Medicare Other | Attending: Emergency Medicine | Admitting: Emergency Medicine

## 2021-07-06 ENCOUNTER — Other Ambulatory Visit: Payer: Self-pay

## 2021-07-06 DIAGNOSIS — S3992XA Unspecified injury of lower back, initial encounter: Secondary | ICD-10-CM | POA: Diagnosis present

## 2021-07-06 DIAGNOSIS — Z5321 Procedure and treatment not carried out due to patient leaving prior to being seen by health care provider: Secondary | ICD-10-CM | POA: Insufficient documentation

## 2021-07-06 DIAGNOSIS — W57XXXA Bitten or stung by nonvenomous insect and other nonvenomous arthropods, initial encounter: Secondary | ICD-10-CM | POA: Diagnosis not present

## 2021-07-06 DIAGNOSIS — S30860A Insect bite (nonvenomous) of lower back and pelvis, initial encounter: Secondary | ICD-10-CM | POA: Insufficient documentation

## 2021-07-06 DIAGNOSIS — R509 Fever, unspecified: Secondary | ICD-10-CM | POA: Insufficient documentation

## 2021-07-06 LAB — CBC WITH DIFFERENTIAL/PLATELET
Abs Immature Granulocytes: 0.06 10*3/uL (ref 0.00–0.07)
Basophils Absolute: 0.1 10*3/uL (ref 0.0–0.1)
Basophils Relative: 0 %
Eosinophils Absolute: 0.1 10*3/uL (ref 0.0–0.5)
Eosinophils Relative: 1 %
HCT: 41.3 % (ref 36.0–46.0)
Hemoglobin: 13.7 g/dL (ref 12.0–15.0)
Immature Granulocytes: 1 %
Lymphocytes Relative: 32 %
Lymphs Abs: 3.8 10*3/uL (ref 0.7–4.0)
MCH: 29.7 pg (ref 26.0–34.0)
MCHC: 33.2 g/dL (ref 30.0–36.0)
MCV: 89.4 fL (ref 80.0–100.0)
Monocytes Absolute: 0.5 10*3/uL (ref 0.1–1.0)
Monocytes Relative: 5 %
Neutro Abs: 7.4 10*3/uL (ref 1.7–7.7)
Neutrophils Relative %: 61 %
Platelets: 360 10*3/uL (ref 150–400)
RBC: 4.62 MIL/uL (ref 3.87–5.11)
RDW: 13.6 % (ref 11.5–15.5)
WBC: 11.9 10*3/uL — ABNORMAL HIGH (ref 4.0–10.5)
nRBC: 0 % (ref 0.0–0.2)

## 2021-07-06 LAB — LACTIC ACID, PLASMA: Lactic Acid, Venous: 2.4 mmol/L (ref 0.5–1.9)

## 2021-07-06 LAB — BASIC METABOLIC PANEL
Anion gap: 10 (ref 5–15)
BUN: 14 mg/dL (ref 6–20)
CO2: 26 mmol/L (ref 22–32)
Calcium: 9.6 mg/dL (ref 8.9–10.3)
Chloride: 102 mmol/L (ref 98–111)
Creatinine, Ser: 0.85 mg/dL (ref 0.44–1.00)
GFR, Estimated: >60 mL/min (ref >60)
Glucose, Bld: 115 mg/dL — ABNORMAL HIGH (ref 70–99)
Potassium: 4.1 mmol/L (ref 3.5–5.1)
Sodium: 138 mmol/L (ref 135–145)

## 2021-07-06 NOTE — ED Triage Notes (Signed)
Pt arrived POV from home c/o a "spider bite" to her butt. Pt had an abscess that they irrigated and debrided and placed a tube in her right buttocks. Per discharge summary pt was to follow up with general surgery on 6/27. Pt states it is really painful I am afraid it is infected and I need pain medicine.

## 2021-07-26 ENCOUNTER — Encounter (HOSPITAL_COMMUNITY): Payer: Self-pay | Admitting: Emergency Medicine

## 2021-07-26 ENCOUNTER — Other Ambulatory Visit: Payer: Self-pay

## 2021-07-26 ENCOUNTER — Emergency Department (HOSPITAL_COMMUNITY)
Admission: EM | Admit: 2021-07-26 | Discharge: 2021-07-26 | Disposition: A | Discharge status: Home / Self Care | Payer: Medicare Other | Attending: Emergency Medicine | Admitting: Emergency Medicine

## 2021-07-26 DIAGNOSIS — F1110 Opioid abuse, uncomplicated: Secondary | ICD-10-CM | POA: Insufficient documentation

## 2021-07-26 NOTE — Discharge Instructions (Signed)
You were seen today after using substances. I have provided information on substance abuse resources. Follow up as needed.
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
## 2021-07-31 ENCOUNTER — Other Ambulatory Visit (INDEPENDENT_AMBULATORY_CARE_PROVIDER_SITE_OTHER)
Admission: EM | Admit: 2021-07-31 | Discharge: 2021-08-03 | Disposition: A | Discharge status: Home / Self Care | Payer: Medicare Other | Source: Home / Self Care | Admitting: Psychiatry

## 2021-07-31 ENCOUNTER — Emergency Department (HOSPITAL_COMMUNITY)
Admission: EM | Admit: 2021-07-31 | Discharge: 2021-07-31 | Disposition: A | Discharge status: Home / Self Care | Payer: Medicare Other | Source: Home / Self Care

## 2021-07-31 ENCOUNTER — Other Ambulatory Visit: Payer: Self-pay

## 2021-07-31 ENCOUNTER — Encounter (HOSPITAL_COMMUNITY): Payer: Self-pay | Admitting: Emergency Medicine

## 2021-07-31 ENCOUNTER — Encounter (HOSPITAL_COMMUNITY): Payer: Self-pay

## 2021-07-31 ENCOUNTER — Emergency Department (HOSPITAL_COMMUNITY)
Admission: EM | Admit: 2021-07-31 | Discharge: 2021-07-31 | Disposition: A | Discharge status: Home / Self Care | Payer: Medicare Other | Attending: Emergency Medicine | Admitting: Emergency Medicine

## 2021-07-31 DIAGNOSIS — Z62810 Personal history of physical and sexual abuse in childhood: Secondary | ICD-10-CM | POA: Insufficient documentation

## 2021-07-31 DIAGNOSIS — F1090 Alcohol use, unspecified, uncomplicated: Secondary | ICD-10-CM | POA: Insufficient documentation

## 2021-07-31 DIAGNOSIS — F29 Unspecified psychosis not due to a substance or known physiological condition: Secondary | ICD-10-CM | POA: Insufficient documentation

## 2021-07-31 DIAGNOSIS — Z560 Unemployment, unspecified: Secondary | ICD-10-CM | POA: Insufficient documentation

## 2021-07-31 DIAGNOSIS — R946 Abnormal results of thyroid function studies: Secondary | ICD-10-CM | POA: Insufficient documentation

## 2021-07-31 DIAGNOSIS — F1290 Cannabis use, unspecified, uncomplicated: Secondary | ICD-10-CM | POA: Insufficient documentation

## 2021-07-31 DIAGNOSIS — F1129 Opioid dependence with unspecified opioid-induced disorder: Secondary | ICD-10-CM | POA: Insufficient documentation

## 2021-07-31 DIAGNOSIS — F1190 Opioid use, unspecified, uncomplicated: Secondary | ICD-10-CM | POA: Insufficient documentation

## 2021-07-31 DIAGNOSIS — F1410 Cocaine abuse, uncomplicated: Secondary | ICD-10-CM | POA: Insufficient documentation

## 2021-07-31 DIAGNOSIS — F419 Anxiety disorder, unspecified: Secondary | ICD-10-CM | POA: Insufficient documentation

## 2021-07-31 DIAGNOSIS — F1990 Other psychoactive substance use, unspecified, uncomplicated: Secondary | ICD-10-CM | POA: Insufficient documentation

## 2021-07-31 DIAGNOSIS — F17210 Nicotine dependence, cigarettes, uncomplicated: Secondary | ICD-10-CM | POA: Insufficient documentation

## 2021-07-31 DIAGNOSIS — F319 Bipolar disorder, unspecified: Secondary | ICD-10-CM | POA: Insufficient documentation

## 2021-07-31 DIAGNOSIS — F1910 Other psychoactive substance abuse, uncomplicated: Secondary | ICD-10-CM

## 2021-07-31 DIAGNOSIS — A539 Syphilis, unspecified: Secondary | ICD-10-CM | POA: Insufficient documentation

## 2021-07-31 DIAGNOSIS — Z20822 Contact with and (suspected) exposure to covid-19: Secondary | ICD-10-CM | POA: Insufficient documentation

## 2021-07-31 DIAGNOSIS — Z79899 Other long term (current) drug therapy: Secondary | ICD-10-CM | POA: Insufficient documentation

## 2021-07-31 LAB — COMPREHENSIVE METABOLIC PANEL
ALT: 20 U/L (ref 0–44)
AST: 19 U/L (ref 15–41)
Albumin: 4.1 g/dL (ref 3.5–5.0)
Alkaline Phosphatase: 61 U/L (ref 38–126)
Anion gap: 11 (ref 5–15)
BUN: 5 mg/dL — ABNORMAL LOW (ref 6–20)
CO2: 27 mmol/L (ref 22–32)
Calcium: 9.9 mg/dL (ref 8.9–10.3)
Chloride: 102 mmol/L (ref 98–111)
Creatinine, Ser: 0.96 mg/dL (ref 0.44–1.00)
GFR, Estimated: >60 mL/min (ref >60)
Glucose, Bld: 98 mg/dL (ref 70–99)
Potassium: 3.8 mmol/L (ref 3.5–5.1)
Sodium: 140 mmol/L (ref 135–145)
Total Bilirubin: 1.3 mg/dL — ABNORMAL HIGH (ref 0.3–1.2)
Total Protein: 7.2 g/dL (ref 6.5–8.1)

## 2021-07-31 LAB — LIPID PANEL
Cholesterol: 127 mg/dL (ref 0–200)
HDL: 35 mg/dL — ABNORMAL LOW (ref >40)
LDL Cholesterol: 69 mg/dL (ref 0–99)
Total CHOL/HDL Ratio: 3.6 RATIO
Triglycerides: 117 mg/dL (ref <150)
VLDL: 23 mg/dL (ref 0–40)

## 2021-07-31 LAB — CBC WITH DIFFERENTIAL/PLATELET
Abs Immature Granulocytes: 0.02 10*3/uL (ref 0.00–0.07)
Basophils Absolute: 0 10*3/uL (ref 0.0–0.1)
Basophils Relative: 0 %
Eosinophils Absolute: 0.1 10*3/uL (ref 0.0–0.5)
Eosinophils Relative: 1 %
HCT: 38.7 % (ref 36.0–46.0)
Hemoglobin: 13.4 g/dL (ref 12.0–15.0)
Immature Granulocytes: 0 %
Lymphocytes Relative: 21 %
Lymphs Abs: 1.6 10*3/uL (ref 0.7–4.0)
MCH: 29.7 pg (ref 26.0–34.0)
MCHC: 34.6 g/dL (ref 30.0–36.0)
MCV: 85.8 fL (ref 80.0–100.0)
Monocytes Absolute: 0.6 10*3/uL (ref 0.1–1.0)
Monocytes Relative: 7 %
Neutro Abs: 5.4 10*3/uL (ref 1.7–7.7)
Neutrophils Relative %: 71 %
Platelets: 275 10*3/uL (ref 150–400)
RBC: 4.51 MIL/uL (ref 3.87–5.11)
RDW: 13.9 % (ref 11.5–15.5)
WBC: 7.7 10*3/uL (ref 4.0–10.5)
nRBC: 0 % (ref 0.0–0.2)

## 2021-07-31 LAB — MAGNESIUM: Magnesium: 1.9 mg/dL (ref 1.7–2.4)

## 2021-07-31 LAB — RESP PANEL BY RT-PCR (FLU A&B, COVID) ARPGX2
Influenza A by PCR: NEGATIVE
Influenza B by PCR: NEGATIVE
SARS Coronavirus 2 by RT PCR: NEGATIVE

## 2021-07-31 LAB — HEMOGLOBIN A1C
Hgb A1c MFr Bld: 5.3 % (ref 4.8–5.6)
Mean Plasma Glucose: 105.41 mg/dL

## 2021-07-31 LAB — TSH: TSH: 0.111 u[IU]/mL — ABNORMAL LOW (ref 0.350–4.500)

## 2021-07-31 LAB — HIV ANTIBODY (ROUTINE TESTING W REFLEX): HIV Screen 4th Generation wRfx: NONREACTIVE

## 2021-07-31 LAB — POC SARS CORONAVIRUS 2 AG: SARSCOV2ONAVIRUS 2 AG: NEGATIVE

## 2021-07-31 LAB — ETHANOL: Alcohol, Ethyl (B): <10 mg/dL (ref <10)

## 2021-07-31 MED ORDER — MAGNESIUM HYDROXIDE 400 MG/5ML PO SUSP: 30.0000 mL | Freq: Every day | ORAL | Status: DC | PRN

## 2021-07-31 MED ORDER — METHOCARBAMOL 500 MG PO TABS
500.0000 mg | ORAL_TABLET | Freq: Three times a day (TID) | ORAL | Status: DC | PRN
  Administered 2021-07-31 – 2021-08-02 (×4): 500 mg via ORAL
  Filled 2021-07-31 (×5): qty 1

## 2021-07-31 MED ORDER — NAPROXEN 500 MG PO TABS
500.0000 mg | ORAL_TABLET | Freq: Two times a day (BID) | ORAL | Status: DC | PRN
  Administered 2021-08-01 (×2): 500 mg via ORAL
  Filled 2021-07-31 (×2): qty 1

## 2021-07-31 MED ORDER — THIAMINE HCL 100 MG PO TABS
100.0000 mg | ORAL_TABLET | Freq: Every day | ORAL | Status: DC
  Administered 2021-08-01 – 2021-08-03 (×3): 100 mg via ORAL
  Filled 2021-07-31 (×2): qty 1

## 2021-07-31 MED ORDER — CLONIDINE HCL 0.1 MG PO TABS
0.1000 mg | ORAL_TABLET | ORAL | Status: DC
  Administered 2021-08-03: 0.1 mg via ORAL
  Filled 2021-07-31: qty 1

## 2021-07-31 MED ORDER — TRAZODONE HCL 50 MG PO TABS
50.0000 mg | ORAL_TABLET | Freq: Every evening | ORAL | Status: DC | PRN
  Administered 2021-07-31 – 2021-08-02 (×3): 50 mg via ORAL
  Filled 2021-07-31 (×3): qty 1

## 2021-07-31 MED ORDER — THIAMINE HCL 100 MG/ML IJ SOLN
100.0000 mg | Freq: Once | INTRAMUSCULAR | Status: AC
  Administered 2021-07-31: 100 mg via INTRAMUSCULAR
  Filled 2021-07-31: qty 2

## 2021-07-31 MED ORDER — ONDANSETRON 4 MG PO TBDP
4.0000 mg | ORAL_TABLET | Freq: Four times a day (QID) | ORAL | Status: DC | PRN
  Administered 2021-08-01 – 2021-08-02 (×2): 4 mg via ORAL
  Filled 2021-07-31 (×2): qty 1

## 2021-07-31 MED ORDER — LOPERAMIDE HCL 2 MG PO CAPS: 2.0000 mg | ORAL_CAPSULE | ORAL | Status: DC | PRN

## 2021-07-31 MED ORDER — CLONIDINE HCL 0.1 MG PO TABS
0.1000 mg | ORAL_TABLET | Freq: Four times a day (QID) | ORAL | Status: DC
  Administered 2021-07-31 – 2021-08-02 (×9): 0.1 mg via ORAL
  Filled 2021-07-31 (×10): qty 1

## 2021-07-31 MED ORDER — DICYCLOMINE HCL 20 MG PO TABS
20.0000 mg | ORAL_TABLET | Freq: Four times a day (QID) | ORAL | Status: DC | PRN
  Administered 2021-08-01 – 2021-08-02 (×3): 20 mg via ORAL
  Filled 2021-07-31 (×4): qty 1

## 2021-07-31 MED ORDER — ADULT MULTIVITAMIN W/MINERALS CH
1.0000 | ORAL_TABLET | Freq: Every day | ORAL | Status: DC
  Administered 2021-07-31 – 2021-08-03 (×4): 1 via ORAL
  Filled 2021-07-31 (×4): qty 1

## 2021-07-31 MED ORDER — ONDANSETRON 4 MG PO TBDP: 4.0000 mg | ORAL_TABLET | Freq: Four times a day (QID) | ORAL | Status: DC | PRN

## 2021-07-31 MED ORDER — ALUM & MAG HYDROXIDE-SIMETH 200-200-20 MG/5ML PO SUSP: 30.0000 mL | ORAL | Status: DC | PRN

## 2021-07-31 MED ORDER — CLONIDINE HCL 0.1 MG PO TABS: 0.1000 mg | ORAL_TABLET | Freq: Every day | ORAL | Status: DC

## 2021-07-31 MED ORDER — HYDROXYZINE HCL 25 MG PO TABS
25.0000 mg | ORAL_TABLET | Freq: Four times a day (QID) | ORAL | Status: DC | PRN
  Administered 2021-08-01 – 2021-08-03 (×3): 25 mg via ORAL
  Filled 2021-07-31 (×6): qty 1

## 2021-07-31 MED ORDER — ACETAMINOPHEN 325 MG PO TABS
650.0000 mg | ORAL_TABLET | Freq: Four times a day (QID) | ORAL | Status: DC | PRN
  Administered 2021-07-31 – 2021-08-02 (×3): 650 mg via ORAL
  Filled 2021-07-31 (×3): qty 2

## 2021-07-31 MED ORDER — LORAZEPAM 1 MG PO TABS
1.0000 mg | ORAL_TABLET | Freq: Four times a day (QID) | ORAL | Status: DC | PRN
  Administered 2021-07-31 – 2021-08-01 (×2): 1 mg via ORAL
  Filled 2021-07-31 (×2): qty 1

## 2021-07-31 NOTE — ED Notes (Signed)
Pt is awake and alert.  She has flat affect and irritable mood. Pt is redirectable and polite.  She was searched by Emeterio Reeve RN with no contaband found.  Pt was brought onto Obs unit and given sandwich and prn medication.  She promptly went to sleep.  Staff will cont to monitor.

## 2021-07-31 NOTE — ED Provider Notes (Signed)
[facility] EMERGENCY DEPARTMENT Provider Note   CSN: [id number] Arrival date & time: 07/31/21  1059     History  Opioid dependence   [name] is a 41 y.o. female patient presents to the emergency department with chief complaint of opioid dependence.  She wants to get detox and get started on "subs." She has no medical complaints at this time denies suicidal ideation, homicidal ideation or audiovisual hallucinations.  HPI     Home Medications Prior to Admission medications   Medication Sig Start Date End Date Taking? Authorizing Provider  folic acid (FOLVITE) 1 MG tablet Take 1 tablet (1 mg total) by mouth daily. 06/30/21   Burnadette Pop, MD  ibuprofen (ADVIL) 600 MG tablet Take 1 tablet (600 mg total) by mouth every 8 (eight) hours as needed for headache, mild pain or moderate pain. 06/29/21   Burnadette Pop, MD  thiamine 100 MG tablet Take 1 tablet (100 mg total) by mouth daily. 06/30/21   Burnadette Pop, MD      Allergies    Cephalexin    Review of Systems   Review of Systems  Physical Exam Updated Vital Signs BP (!) 137/93 (BP Location: Right Arm)   Pulse (!) 123   Temp 98.6 F (37 C) (Oral)   Resp 16   SpO2 98%  Physical Exam Vitals and nursing note reviewed.  Constitutional:      General: She is not in acute distress.    Appearance: She is well-developed. She is not diaphoretic.     Comments: Appears minimally drowsy, smells strongly of marijuana  HENT:     Head: Normocephalic and atraumatic.     Right Ear: External ear normal.     Left Ear: External ear normal.     Nose: Nose normal.     Mouth/Throat:     Mouth: Mucous membranes are moist.  Eyes:     General: No scleral icterus.    Conjunctiva/sclera: Conjunctivae normal.  Cardiovascular:     Rate and Rhythm: Normal rate and regular rhythm.     Heart sounds: Normal heart sounds. No murmur heard.    No friction rub. No gallop.  Pulmonary:     Effort: Pulmonary effort is  normal. No respiratory distress.     Breath sounds: Normal breath sounds.  Abdominal:     General: Bowel sounds are normal. There is no distension.     Palpations: Abdomen is soft. There is no mass.     Tenderness: There is no abdominal tenderness. There is no guarding.  Musculoskeletal:     Cervical back: Normal range of motion.  Skin:    General: Skin is warm and dry.  Neurological:     Mental Status: She is alert and oriented to person, place, and time.  Psychiatric:        Behavior: Behavior normal.     ED Results / Procedures / Treatments   Labs (all labs ordered are listed, but only abnormal results are displayed) Labs Reviewed - No data to display  EKG None  Radiology No results found.  Procedures Procedures    Medications Ordered in ED Medications - No data to display  ED Course/ Medical Decision Making/ A&P                           Medical Decision Making Patient understands that we do not provide detox services.  She has no psychiatric emergency complaints.  She has  no other medical complaints.  I did see that the patient is moderately tachycardic pout.  She has no chest pain or shortness of breath.  Given her concern for substance abuse patient will be discharged with outpatient resources.  Discussed return precautions.           Final Clinical Impression(s) / ED Diagnoses Final diagnoses:  Substance use disorder    Rx / DC Orders ED Discharge Orders     None         Arthor Captain, PA-C 07/31/21 1120    Melene Plan, DO 07/31/21 1222

## 2021-07-31 NOTE — ED Provider Notes (Signed)
[facility] EMERGENCY DEPARTMENT Provider Note   CSN: [id number] Arrival date & time: 07/31/21  1154     History  Chief Complaint  Patient presents with   Withdrawal    [name] is a 41 y.o. female.  Patient with history of polysubstance abuse presents today with complaints of opioid dependence.  She is requesting detox and wants to get started on 'subs.'  She was just seen for same and discharged, states that nothing changed in her condition, however 'my mom yelled at me to check back in.'  She continues to deny chest pain, shortness of breath, SI/HI, or AVH.  The history is provided by the patient. No language interpreter was used.       Home Medications Prior to Admission medications   Medication Sig Start Date End Date Taking? Authorizing Provider  folic acid (FOLVITE) 1 MG tablet Take 1 tablet (1 mg total) by mouth daily. 06/30/21   Burnadette Pop, MD  ibuprofen (ADVIL) 600 MG tablet Take 1 tablet (600 mg total) by mouth every 8 (eight) hours as needed for headache, mild pain or moderate pain. 06/29/21   Burnadette Pop, MD  thiamine 100 MG tablet Take 1 tablet (100 mg total) by mouth daily. 06/30/21   Burnadette Pop, MD      Allergies    Cephalexin    Review of Systems   Review of Systems  All other systems reviewed and are negative.   Physical Exam Updated Vital Signs BP 131/88 (BP Location: Left Arm)   Pulse 100   Temp 98.5 F (36.9 C) (Oral)   Resp 14   Ht 5\' 6"  (1.676 m)   Wt 62 kg   SpO2 98%   BMI 22.06 kg/m  Physical Exam Vitals and nursing note reviewed.  Constitutional:      General: She is not in acute distress.    Appearance: Normal appearance. She is normal weight. She is not ill-appearing, toxic-appearing or diaphoretic.     Comments: Strong odor of marijuana  HENT:     Head: Normocephalic and atraumatic.  Cardiovascular:     Rate and Rhythm: Normal rate.  Pulmonary:     Effort: Pulmonary effort is normal. No  respiratory distress.  Musculoskeletal:        General: Normal range of motion.     Cervical back: Normal range of motion.  Skin:    General: Skin is warm and dry.  Neurological:     General: No focal deficit present.     Mental Status: She is alert and oriented to person, place, and time.  Psychiatric:        Mood and Affect: Mood normal.        Behavior: Behavior normal.     Comments: Does not appear to be responding to internal stimuli     ED Results / Procedures / Treatments   Labs (all labs ordered are listed, but only abnormal results are displayed) Labs Reviewed - No data to display  EKG None  Radiology No results found.  Procedures Procedures    Medications Ordered in ED Medications - No data to display  ED Course/ Medical Decision Making/ A&P                           Medical Decision Making  Patient returns today requesting assistance with opioid withdrawal. I have again explained that we do not provide detox services. She expresses understanding. She has no  psychiatric emergency complaints.  She has no other medical complaints.  I did see that the patient is moderately tachycardic, however she continues to deny chest pain or shortness of breath. She was previously discharged with outpatient resources, I have personally re-discussed these resources with the patient who still has her discharge paperwork from her previous visit. She expresses understanding and is stable for discharge. Educated on red flag symptoms that would prompt immediate return. Discharged in stable condition.   Final Clinical Impression(s) / ED Diagnoses Final diagnoses:  Opioid dependence with opioid-induced disorder (HCC)    Rx / DC Orders ED Discharge Orders     None     An After Visit Summary was printed and given to the patient.     Vear Clock 07/31/21 1302    Melene Plan, DO 07/31/21 1547

## 2021-07-31 NOTE — ED Notes (Signed)
Pt sleeping at present, no distress noted, pt requested to give urine specimen again.   Monitoring for safety.

## 2021-07-31 NOTE — ED Notes (Signed)
Re-discharged by PA at triage.

## 2021-07-31 NOTE — Progress Notes (Signed)
   07/31/21 1509  [facility] Triage Screening (Walk-ins at [facility] only)  What Is the Reason for Your Visit/Call Today? Patient presents requesting detox, however feels she cannot detox without a MAT/subutex option.  She is with her sister, who is trying to convince her to consider detox without MAT.    Patient reports she relapsed on IV heroin, fentanyl and meth 1 week ago.  Patient will likely request outpatient MAT options. Patient denies SI, HI and AVH.  How Long Has This Been Causing You Problems? > than 6 months  Have You Recently Had Any Thoughts About Hurting Yourself? No  Are You Planning to Commit Suicide/Harm Yourself At This time? No  Have you Recently Had Thoughts About Hurting Someone Karolee Ohs? No  Are You Planning To Harm Someone At This Time? No  Are you currently experiencing any auditory, visual or other hallucinations? No  Have You Used Any Alcohol or Drugs in the Past 24 Hours? Yes  How long ago did you use Drugs or Alcohol? yesterday  What Did You Use and How Much? patient unaware of amounts "what I can get my hands on"  Do you have any current medical co-morbidities that require immediate attention? No  Clinician description of patient physical appearance/behavior: Patient is irritable, cooperative AAOx5  What Do You Feel Would Help You the Most Today? Alcohol or Drug Use Treatment  If access to [facility] was not available, would you have sought care in the Emergency Department? No  Determination of Need Routine (7 days)  Options For Referral Chemical Dependency Intensive Outpatient Therapy (CDIOP);Facility-Based Crisis;Other: Comment (Outpt MAT treatment)

## 2021-07-31 NOTE — Discharge Instructions (Signed)
Please follow-up at any of the places listed in the resource guide or at the [facility]. Get help right away if you have a psychiatric emergency.

## 2021-07-31 NOTE — ED Notes (Signed)
Comfort measures given, snack given.  Pt remains irritable.  Monitoring for safety.

## 2021-07-31 NOTE — ED Triage Notes (Signed)
Pt here for detox from heroin.  PA in to see pt prior to RN and being discharged.

## 2021-07-31 NOTE — ED Provider Notes (Signed)
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  6 - 20 mg/dL Final   Creatinine, Ser 05/01/2021 0.84  0.44 - 1.00 mg/dL Final   Calcium 69/62/9528 9.4  8.9 - 10.3 mg/dL Final   Total Protein 41/32/4401 7.7  6.5 - 8.1 g/dL Final   Albumin 02/72/5366 4.3  3.5 - 5.0 g/dL Final   AST 44/03/4740 28  15 - 41 U/L Final   ALT 05/01/2021 29  0 -  44 U/L Final   Alkaline Phosphatase 05/01/2021 58  38 - 126 U/L Final   Total Bilirubin 05/01/2021 1.0  0.3 - 1.2 mg/dL Final   GFR, Estimated 05/01/2021 >60  >60 mL/min Final   Comment: (NOTE) Calculated using the CKD-EPI Creatinine Equation (2021)    Anion gap 05/01/2021 7  5 - 15 Final   Performed at [facility], [address], [place], [place] [address]   Alcohol, Ethyl (B) 05/01/2021 <10  <10 mg/dL Final   Comment: (NOTE) Lowest detectable limit for serum alcohol is 10 mg/dL.  For medical purposes only. Performed at [facility], [address], [place], [place] [address]    Salicylate Lvl 05/01/2021 <7.0 (L)  7.0 - 30.0 mg/dL Final   Performed at [facility], [address] [place], [place] [address]   Acetaminophen (Tylenol), Serum 05/01/2021 <10 (L)  10 - 30 ug/mL Final   Comment: (NOTE) Therapeutic concentrations vary significantly. A range of 10-30 ug/mL  may be an effective concentration for many patients. However, some  are best treated at concentrations outside of this range. Acetaminophen concentrations >150 ug/mL at 4 hours after ingestion  and >50 ug/mL at 12 hours after ingestion are often associated with  toxic reactions.  Performed at [facility], [address] [place], [place] [address]    WBC 05/01/2021 7.8  4.0 - 10.5 K/uL Final   RBC 05/01/2021 4.76  3.87 - 5.11 MIL/uL Final   Hemoglobin 05/01/2021 14.2  12.0 - 15.0 g/dL Final   HCT 66/06/3014 40.6  36.0 - 46.0 % Final   MCV 05/01/2021 85.3  80.0 - 100.0 fL Final   MCH 05/01/2021 29.8  26.0 - 34.0 pg Final   MCHC 05/01/2021 35.0  30.0 - 36.0 g/dL Final   RDW 01/22/3233 12.8  11.5 - 15.5 % Final   Platelets 05/01/2021 254  150 - 400 K/uL Final   nRBC 05/01/2021 0.0  0.0 - 0.2 % Final   Performed at [facility], [address], [place], [place] [address]   Opiates 05/02/2021 POSITIVE (A)  NONE DETECTED  Final   Cocaine 05/02/2021 POSITIVE (A)  NONE DETECTED Final   Benzodiazepines 05/02/2021 NONE DETECTED  NONE DETECTED Final   Amphetamines 05/02/2021 POSITIVE (A)  NONE DETECTED Final   Tetrahydrocannabinol 05/02/2021 NONE DETECTED  NONE DETECTED Final   Barbiturates 05/02/2021 NONE DETECTED  NONE DETECTED Final   Comment: (NOTE) DRUG SCREEN FOR MEDICAL PURPOSES ONLY.  IF CONFIRMATION IS NEEDED FOR  ANY PURPOSE, NOTIFY LAB WITHIN 5 DAYS.  LOWEST DETECTABLE LIMITS FOR URINE DRUG SCREEN Drug Class                     Cutoff (ng/mL) Amphetamine and metabolites    1000 Barbiturate and metabolites    200 Benzodiazepine                 200 Tricyclics and metabolites     300 Opiates and metabolites        300 Cocaine and metabolites        300 THC                            50 Performed at [facility], [address] [place], [place] [id number]    I-stat hCG, quantitative 05/01/2021 <5.0  <5 mIU/mL Final   Comment 3 05/01/2021          Final   Comment:   GEST. AGE      CONC.  (mIU/mL)   <=1 WEEK        5 - 50     2 WEEKS       50 - 500     3 WEEKS       100 - 10,000     4 WEEKS     1,000 - 30,000        FEMALE AND NON-PREGNANT FEMALE:     LESS THAN 5 mIU/mL    Glucose-Capillary 05/01/2021 93  70 - 99 mg/dL Final   Glucose reference range applies only to samples taken after fasting for at least 8 hours.   SARS Coronavirus 2 by RT PCR 05/01/2021 NEGATIVE  NEGATIVE Final   Comment: (NOTE) SARS-CoV-2 target nucleic acids are NOT DETECTED.  The SARS-CoV-2 RNA is generally detectable in upper respiratory specimens during the acute phase of infection. The lowest concentration of SARS-CoV-2 viral copies this assay can detect is 138 copies/mL. A negative result does not preclude SARS-Cov-2 infection and should not be used as the sole basis for treatment or other patient management decisions. A negative result may occur with  improper specimen collection/handling,  submission of specimen other than nasopharyngeal swab, presence of viral mutation(s) within the areas targeted by this assay, and inadequate number of viral copies(<138 copies/mL). A negative result must be combined with clinical observations, patient history, and epidemiological information. The expected result is Negative.  Fact Sheet for Patients:  BloggerCourse.comhttps://www.fda.gov/media/152166/download  Fact Sheet for Healthcare Providers:  SeriousBroker.ithttps://www.fda.gov/media/152162/download  This test is no                          t yet approved or cleared by the [place] FDA and  has been authorized for detection and/or diagnosis of SARS-CoV-2 by FDA under an Emergency Use Authorization (EUA). This EUA will remain  in effect (meaning this test can be used) for the duration of the COVID-19 declaration under Section 564(b)(1) of the Act, 21 U.S.C.section 360bbb-3(b)(1), unless the authorization is terminated  or revoked sooner.       Influenza A by PCR 05/01/2021 NEGATIVE  NEGATIVE Final   Influenza B by PCR 05/01/2021 NEGATIVE  NEGATIVE Final   Comment: (NOTE) The Xpert Xpress SARS-CoV-2/FLU/RSV plus assay is intended as an aid in the diagnosis of influenza from Nasopharyngeal swab specimens and should not be used as a sole basis for treatment. Nasal washings and  aspirates are unacceptable for Xpert Xpress SARS-CoV-2/FLU/RSV testing.  Fact Sheet for Patients: BloggerCourse.com  Fact Sheet for Healthcare Providers: SeriousBroker.it  This test is not yet approved or cleared by the [place] FDA and has been authorized for detection and/or diagnosis of SARS-CoV-2 by FDA under an Emergency Use Authorization (EUA). This EUA will remain in effect (meaning this test can be used) for the duration of the COVID-19 declaration under Section 564(b)(1) of the Act, 21 U.S.C. section 360bbb-3(b)(1), unless the authorization is terminated  or revoked.  Performed at [facility], [address] [place], [place] [address]     Allergies: Cephalexin  PTA Medications: (Not in a hospital admission)   Medical Decision Making  Patient presents to [facility] seeking opioid detox.  She has a history of fentanyl overdose.  She is at high risk of relapse.  She meets criteria for treatment in the [facility].  At this time she will be admitted to the continuous assessment unit for overnight observation and will be reevaluated for [facility] in the a.m.    Recommendations  Based on my evaluation the patient does not appear to have an emergency medical condition.  At this time she will be admitted to the continuous assessment unit for overnight observation and will be reevaluated for [facility] in the a.m.  CIWA and COWS protocol initiated.  Lab Orders         Resp Panel by RT-PCR (Flu A&B, Covid) Anterior Nasal Swab         SARS Coronavirus 2 by RT PCR (hospital order, performed in [facility] hospital lab) *cepheid single result test* Anterior Nasal Swab         CBC with Differential/Platelet         Comprehensive metabolic panel         Hemoglobin A1c         Magnesium         Ethanol         Lipid panel         TSH         RPR         Pregnancy, urine         Urinalysis, Routine w reflex microscopic Urine, Clean Catch         HIV Antibody (routine testing w rflx)         POCT Urine Drug Screen - (I-Screen)         POC SARS Coronavirus 2 Ag-ED - Nasal Swab      EKG  Ardis Hughs, NP 07/31/21  5:10 PM

## 2021-07-31 NOTE — ED Triage Notes (Signed)
Pt arrives POV for eval of withdrawals. Pt is requesting suboxone. States she had a clinic in [place], but hasnt established one here. Has been getting them on the street from a friend.

## 2021-08-01 DIAGNOSIS — F1990 Other psychoactive substance use, unspecified, uncomplicated: Secondary | ICD-10-CM | POA: Diagnosis not present

## 2021-08-01 DIAGNOSIS — F1910 Other psychoactive substance abuse, uncomplicated: Secondary | ICD-10-CM | POA: Diagnosis not present

## 2021-08-01 LAB — RPR
RPR Ser Ql: POSITIVE — AB
RPR Titer: 1:1 {titer}

## 2021-08-01 LAB — POCT URINE DRUG SCREEN - MANUAL ENTRY (I-SCREEN)
POC Amphetamine UR: POSITIVE — AB
POC Buprenorphine (BUP): POSITIVE — AB
POC Cocaine UR: POSITIVE — AB
POC Marijuana UR: POSITIVE — AB
POC Methadone UR: NOT DETECTED
POC Methamphetamine UR: POSITIVE — AB
POC Morphine: NOT DETECTED
POC Oxazepam (BZO): POSITIVE — AB
POC Oxycodone UR: NOT DETECTED
POC Secobarbital (BAR): NOT DETECTED

## 2021-08-01 LAB — URINALYSIS, ROUTINE W REFLEX MICROSCOPIC
Bacteria, UA: NONE SEEN
Bilirubin Urine: NEGATIVE
Glucose, UA: NEGATIVE mg/dL
Ketones, ur: NEGATIVE mg/dL
Leukocytes,Ua: NEGATIVE
Nitrite: NEGATIVE
Protein, ur: NEGATIVE mg/dL
Specific Gravity, Urine: 1.015 (ref 1.005–1.030)
pH: 6 (ref 5.0–8.0)

## 2021-08-01 LAB — POC SARS CORONAVIRUS 2 AG -  ED: SARS Coronavirus 2 Ag: NEGATIVE

## 2021-08-01 LAB — PREGNANCY, URINE: Preg Test, Ur: NEGATIVE

## 2021-08-01 MED ORDER — PENICILLIN G BENZATHINE 1200000 UNIT/2ML IM SUSY
2.4000 10*6.[IU] | PREFILLED_SYRINGE | INTRAMUSCULAR | Status: DC
  Administered 2021-08-01: 2.4 10*6.[IU] via INTRAMUSCULAR
  Filled 2021-08-01: qty 4

## 2021-08-01 NOTE — ED Notes (Signed)
Pt is awake and alert.  She is continuing to complain of generalized body pain. 6/10  given PRN medication as ordered.

## 2021-08-01 NOTE — ED Notes (Addendum)
Pt very labile & irritable, using the bathroom with the door wide open and requesting excessive amounts of food.  Pt continues to refuse to give a urine specimen.

## 2021-08-01 NOTE — ED Notes (Signed)
Writer went in to do environmental checks and found bag of chips, bag of honey grahams and some juice. It was explained to her that she can not have food in her room. She was very upset. She was also asked if she ws going to go to AA meeting pt said she cant she is sick she just cant. Pt then went to bathroom and came to nurses desk and stated" I do not like being woke the fuck up I am fucking detoxing"

## 2021-08-01 NOTE — Progress Notes (Signed)
[name] remained somewhat isolated to her room throughout the evening. She showered and was compliant with her medications. Her affect remains flat.

## 2021-08-01 NOTE — ED Notes (Signed)
Pt resting quietly, breathing even and unlabored. Will continue to monitor for safety.

## 2021-08-01 NOTE — ED Notes (Signed)
Pt sleeping@this time. Breathing even and unlabored. Will continue to monitor for safety 

## 2021-08-01 NOTE — ED Notes (Signed)
Pt in bed calm and cooeprative. No c/o pain or distress. Will continue to monitor for safety

## 2021-08-01 NOTE — ED Provider Notes (Signed)
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 06/25/2021 13.3  12.0 - 15.0 g/dL Final   HCT 54/09/8117 39.0  36.0 - 46.0 % Final   SARS Coronavirus 2 by RT PCR 06/25/2021 NEGATIVE  NEGATIVE Final   Comment: (NOTE) SARS-CoV-2 target nucleic acids are NOT DETECTED.  The SARS-CoV-2 RNA is generally detectable in upper respiratory specimens during  the acute phase of infection. The lowest concentration of SARS-CoV-2 viral copies this assay can detect is 138 copies/mL. A negative result does not preclude SARS-Cov-2 infection and should not be used as the sole basis for treatment or other patient management decisions. A negative result may occur with  improper specimen collection/handling, submission of specimen other than nasopharyngeal swab, presence of viral mutation(s) within the areas targeted by this assay, and inadequate number of viral copies(<138 copies/mL). A negative result must be combined with clinical observations, patient history, and epidemiological information. The expected result is Negative.  Fact Sheet for Patients:  BloggerCourse.com  Fact Sheet for Healthcare Providers:  SeriousBroker.it  This test is no                          t yet approved or cleared by the [place] FDA and  has been authorized for detection and/or diagnosis of SARS-CoV-2 by FDA under an Emergency Use Authorization (EUA). This EUA will remain  in effect (meaning this test can be used) for the duration of the COVID-19 declaration under Section 564(b)(1) of the Act, 21 U.S.C.section 360bbb-3(b)(1), unless the authorization is terminated  or revoked sooner.       Influenza A by PCR 06/25/2021 NEGATIVE  NEGATIVE Final   Influenza B by PCR 06/25/2021 NEGATIVE  NEGATIVE Final   Comment: (NOTE) The Xpert Xpress SARS-CoV-2/FLU/RSV plus assay is intended as an aid in the diagnosis of influenza from Nasopharyngeal swab specimens and should not be used as a sole basis for treatment. Nasal washings and aspirates are unacceptable for Xpert Xpress SARS-CoV-2/FLU/RSV testing.  Fact Sheet for Patients: BloggerCourse.com  Fact Sheet for Healthcare Providers: SeriousBroker.it  This test is not yet approved or cleared by the [place] FDA  and has been authorized for detection and/or diagnosis of SARS-CoV-2 by FDA under an Emergency Use Authorization (EUA). This EUA will remain in effect (meaning this test can be used) for the duration of the COVID-19 declaration under Section 564(b)(1) of the Act, 21 U.S.C. section 360bbb-3(b)(1), unless the authorization is terminated or revoked.  Performed at [facility], [address] [place], [place] [address]    HIV Screen 4th Generation wRfx 06/25/2021 Non Reactive  Non Reactive Final   Performed at [facility],  [address] [place], [place] [address]   Specimen Description 06/25/2021 ABSCESS   Final   Special Requests 06/25/2021 NONE   Final   Gram Stain 06/25/2021    Final                   Value:NO WBC SEEN MODERATE GRAM POSITIVE COCCI IN CLUSTERS Performed at [facility], [address], [place], [place] [address]    Culture 06/25/2021 ABUNDANT METHICILLIN RESISTANT STAPHYLOCOCCUS AUREUS   Final   Report Status 06/25/2021 06/27/2021 FINAL   Final   Organism ID, Bacteria 06/25/2021 METHICILLIN RESISTANT STAPHYLOCOCCUS AUREUS   Final   MRSA, PCR 06/25/2021 POSITIVE (A)  NEGATIVE Final   Comment: RESULT CALLED TO, READ BACK BY AND VERIFIED WITH: E CASTRO,RN@[email or web address]  06/26/21 MK    Staphylococcus aureus 06/25/2021 POSITIVE (A)  NEGATIVE Final   Comment: (NOTE) The Xpert SA Assay (FDA approved for NASAL specimens in patients 60 years of age and older), is one component of a comprehensive surveillance program. It is not intended to diagnose infection nor to guide or monitor treatment. Performed at [facility], [address] [place], [place] [address]    Sodium 06/26/2021 135  135 - 145 mmol/L Final   Potassium 06/26/2021 3.8  3.5 - 5.1 mmol/L Final   Chloride 06/26/2021 101  98 - 111 mmol/L Final   CO2 06/26/2021 27  22 - 32 mmol/L Final   Glucose, Bld 06/26/2021 111 (H)  70 - 99 mg/dL Final   Glucose reference range applies only to samples  taken after fasting for at least 8 hours.   BUN 06/26/2021 8  6 - 20 mg/dL Final   Creatinine, Ser 06/26/2021 0.88  0.44 - 1.00 mg/dL Final   Calcium 16/07/3708 8.3 (L)  8.9 - 10.3 mg/dL Final   GFR, Estimated 06/26/2021 >60  >60 mL/min Final   Comment: (NOTE) Calculated using the CKD-EPI Creatinine Equation (2021)    Anion gap 06/26/2021 7  5 - 15 Final   Performed at [facility], [address] [place], [place] [address]   WBC 06/26/2021 12.5 (H)  4.0 - 10.5 K/uL Final   RBC 06/26/2021 3.45 (L)  3.87 - 5.11 MIL/uL Final   Hemoglobin 06/26/2021 10.2 (L)  12.0 - 15.0 g/dL Final   HCT 85/46/2703 30.0 (L)  36.0 - 46.0 % Final   MCV 06/26/2021 87.0  80.0 - 100.0 fL Final   MCH 06/26/2021 29.6  26.0 - 34.0 pg Final   MCHC 06/26/2021 34.0  30.0 - 36.0 g/dL Final   RDW 50/09/3816 13.1  11.5 - 15.5 % Final   Platelets 06/26/2021 191  150 - 400 K/uL Final   nRBC 06/26/2021 0.0  0.0 - 0.2 % Final   Performed at [facility], [address] [place], [place] [address]   Specimen Description 06/26/2021 BUTTOCKS RIGHT   Final   Special Requests 06/26/2021 NONE   Final   Gram Stain 06/26/2021    Final                   Value:FEW WBC PRESENT,BOTH PMN AND MONONUCLEAR RARE GRAM POSITIVE COCCI IN PAIRS    Culture 06/26/2021    Final                   Value:ABUNDANT METHICILLIN RESISTANT STAPHYLOCOCCUS AUREUS NO ANAEROBES ISOLATED Performed at [facility], [address], [place], [place] [address]    Report  Status 06/26/2021 07/01/2021 FINAL   Final   Organism ID, Bacteria 06/26/2021 METHICILLIN RESISTANT STAPHYLOCOCCUS AUREUS   Final   WBC 06/28/2021 10.4  4.0 - 10.5 K/uL Final   RBC 06/28/2021 3.64 (L)  3.87 - 5.11 MIL/uL Final   Hemoglobin 06/28/2021 11.0 (L)  12.0 - 15.0 g/dL Final   HCT 16/10/9602 31.6 (L)  36.0 - 46.0 % Final   MCV 06/28/2021 86.8  80.0 - 100.0 fL Final   MCH 06/28/2021 30.2  26.0 - 34.0 pg Final   MCHC 06/28/2021 34.8  30.0 - 36.0 g/dL Final   RDW  54/09/8117 13.2  11.5 - 15.5 % Final   Platelets 06/28/2021 279  150 - 400 K/uL Final   nRBC 06/28/2021 0.0  0.0 - 0.2 % Final   Performed at [facility], [address], [place], [place] [address]   WBC 06/29/2021 10.0  4.0 - 10.5 K/uL Final   RBC 06/29/2021 3.62 (L)  3.87 - 5.11 MIL/uL Final   Hemoglobin 06/29/2021 10.7 (L)  12.0 - 15.0 g/dL Final   HCT 95/62/1308 31.1 (L)  36.0 - 46.0 % Final   MCV 06/29/2021 85.9  80.0 - 100.0 fL Final   MCH 06/29/2021 29.6  26.0 - 34.0 pg Final   MCHC 06/29/2021 34.4  30.0 - 36.0 g/dL Final   RDW 65/78/4696 13.2  11.5 - 15.5 % Final   Platelets 06/29/2021 305  150 - 400 K/uL Final   nRBC 06/29/2021 0.0  0.0 - 0.2 % Final   Performed at [facility], [address], [place], [place] [address]  Admission on 05/01/2021, Discharged on 05/02/2021  Component Date Value Ref Range Status   Sodium 05/01/2021 136  135 - 145 mmol/L Final   Potassium 05/01/2021 3.8  3.5 - 5.1 mmol/L Final   Chloride 05/01/2021 105  98 - 111 mmol/L Final   CO2 05/01/2021 24  22 - 32 mmol/L Final   Glucose, Bld 05/01/2021 111 (H)  70 - 99 mg/dL Final   Glucose reference range applies only to samples taken after fasting for at least 8 hours.   BUN 05/01/2021 13  6 - 20 mg/dL Final   Creatinine, Ser 05/01/2021 0.84  0.44 - 1.00 mg/dL Final   Calcium 41/32/4401 9.4  8.9 - 10.3 mg/dL Final   Total Protein 02/72/5366 7.7  6.5 - 8.1 g/dL Final   Albumin 44/03/4740 4.3  3.5 - 5.0 g/dL Final   AST 59/56/3875 28  15 - 41 U/L Final   ALT 05/01/2021 29  0 - 44 U/L Final   Alkaline Phosphatase 05/01/2021 58  38 - 126 U/L Final   Total Bilirubin 05/01/2021 1.0  0.3 - 1.2 mg/dL Final   GFR, Estimated 05/01/2021 >60  >60 mL/min Final   Comment: (NOTE) Calculated using the CKD-EPI Creatinine Equation (2021)    Anion gap 05/01/2021 7  5 - 15 Final   Performed at [facility], [address], [place], [place] [address]   Alcohol, Ethyl (B) 05/01/2021 <10   <10 mg/dL Final   Comment: (NOTE) Lowest detectable limit for serum alcohol is 10 mg/dL.  For medical purposes only. Performed at [facility], [address], [place], [place] [address]    Salicylate Lvl 05/01/2021 <7.0 (L)  7.0 - 30.0 mg/dL Final   Performed at [facility], [address] [place], [place] [address]   Acetaminophen (Tylenol), Serum 05/01/2021 <10 (L)  10 - 30  ug/mL Final   Comment: (NOTE) Therapeutic concentrations vary significantly. A range of 10-30 ug/mL  may be an effective concentration for many patients. However, some  are best treated at concentrations outside of this range. Acetaminophen concentrations >150 ug/mL at 4 hours after ingestion  and >50 ug/mL at 12 hours after ingestion are often associated with  toxic reactions.  Performed at [facility], [address], [place], [place] [address]    WBC 05/01/2021 7.8  4.0 - 10.5 K/uL Final   RBC 05/01/2021 4.76  3.87 - 5.11 MIL/uL Final   Hemoglobin 05/01/2021 14.2  12.0 - 15.0 g/dL Final   HCT 60/45/4098 40.6  36.0 - 46.0 % Final   MCV 05/01/2021 85.3  80.0 - 100.0 fL Final   MCH 05/01/2021 29.8  26.0 - 34.0 pg Final   MCHC 05/01/2021 35.0  30.0 - 36.0 g/dL Final   RDW 11/91/4782 12.8  11.5 - 15.5 % Final   Platelets 05/01/2021 254  150 - 400 K/uL Final   nRBC 05/01/2021 0.0  0.0 - 0.2 % Final   Performed at [facility], [address], [place], [place] [address]   Opiates 05/02/2021 POSITIVE (A)  NONE DETECTED Final   Cocaine 05/02/2021 POSITIVE (A)  NONE DETECTED Final   Benzodiazepines 05/02/2021 NONE DETECTED  NONE DETECTED Final   Amphetamines 05/02/2021 POSITIVE (A)  NONE DETECTED Final   Tetrahydrocannabinol 05/02/2021 NONE DETECTED  NONE DETECTED Final   Barbiturates 05/02/2021 NONE DETECTED  NONE DETECTED Final   Comment: (NOTE) DRUG SCREEN FOR MEDICAL PURPOSES ONLY.  IF CONFIRMATION IS NEEDED FOR ANY PURPOSE,  NOTIFY LAB WITHIN 5 DAYS.  LOWEST DETECTABLE LIMITS FOR URINE DRUG SCREEN Drug Class                     Cutoff (ng/mL) Amphetamine and metabolites    1000 Barbiturate and metabolites    200 Benzodiazepine                 200 Tricyclics and metabolites     300 Opiates and metabolites        300 Cocaine and metabolites        300 THC                            50 Performed at [facility], [address], [place], [place] [address]    I-stat hCG, quantitative 05/01/2021 <5.0  <5 mIU/mL Final   Comment 3 05/01/2021          Final   Comment:   GEST. AGE      CONC.  (mIU/mL)   <=1 WEEK        5 - 50     2 WEEKS       50 - 500     3 WEEKS       100 - 10,000     4 WEEKS     1,000 - 30,000        FEMALE AND NON-PREGNANT FEMALE:     LESS THAN 5 mIU/mL    Glucose-Capillary 05/01/2021 93  70 - 99 mg/dL Final   Glucose reference range applies only to samples taken after fasting for at least 8 hours.   SARS Coronavirus 2 by RT PCR 05/01/2021 NEGATIVE  NEGATIVE Final   Comment: (NOTE) SARS-CoV-2 target nucleic acids are NOT DETECTED.  The SARS-CoV-2 RNA is generally detectable in  upper respiratory specimens during the acute phase of infection. The lowest concentration of SARS-CoV-2 viral copies this assay can detect is 138 copies/mL. A negative result does not preclude SARS-Cov-2 infection and should not be used as the sole basis for treatment or other patient management decisions. A negative result may occur with  improper specimen collection/handling, submission of specimen other than nasopharyngeal swab, presence of viral mutation(s) within the areas targeted by this assay, and inadequate number of viral copies(<138 copies/mL). A negative result must be combined with clinical observations, patient history, and epidemiological information. The expected result is Negative.  Fact Sheet for Patients:  BloggerCourse.com  Fact Sheet for  Healthcare Providers:  SeriousBroker.it  This test is no                          t yet approved or cleared by the [place] FDA and  has been authorized for detection and/or diagnosis of SARS-CoV-2 by FDA under an Emergency Use Authorization (EUA). This EUA will remain  in effect (meaning this test can be used) for the duration of the COVID-19 declaration under Section 564(b)(1) of the Act, 21 U.S.C.section 360bbb-3(b)(1), unless the authorization is terminated  or revoked sooner.       Influenza A by PCR 05/01/2021 NEGATIVE  NEGATIVE Final   Influenza B by PCR 05/01/2021 NEGATIVE  NEGATIVE Final   Comment: (NOTE) The Xpert Xpress SARS-CoV-2/FLU/RSV plus assay is intended as an aid in the diagnosis of influenza from Nasopharyngeal swab specimens and should not be used as a sole basis for treatment. Nasal washings and aspirates are unacceptable for Xpert Xpress SARS-CoV-2/FLU/RSV testing.  Fact Sheet for Patients: BloggerCourse.com  Fact Sheet for Healthcare Providers: SeriousBroker.it  This test is not yet approved or cleared by the [place] FDA and has been authorized for detection and/or diagnosis of SARS-CoV-2 by FDA under an Emergency Use Authorization (EUA). This EUA will remain in effect (meaning this test can be used) for the duration of the COVID-19 declaration under Section 564(b)(1) of the Act, 21 U.S.C. section 360bbb-3(b)(1), unless the authorization is terminated or revoked.  Performed at [facility], [address], [place], [place] [address]     Allergies: Cephalexin  PTA Medications: (Not in a hospital admission)   Long Term Goals: Improvement in symptoms so as ready for discharge  Short Term Goals: Patient will verbalize feelings in meetings with treatment team members., Patient will attend at least of 50% of the groups daily., Pt will complete the  PHQ9 on admission, day 3 and discharge., Patient will participate in completing the [place] Suicide Severity Rating Scale, Patient will score a low risk of violence for 24 hours prior to discharge, and Patient will take medications as prescribed daily.  Medical Decision Making  Patient is requesting opioid detox.  She is tolerating the clonidine taper without any adverse reactions.  She meets the criteria for treatment in the [facility].    Recommendations  Based on my evaluation the patient does not appear to have an emergency medical condition.  Admit patient to the facility base crisis unit.  RPR reactive, RPR titer 1: 1-infectious disease consult initiated for recommendations. Dr. Staci Righter MD recommends Penicillin G 2.4 mil IM one dose weekly  x 3, with instructions to follow up with out patient provider upon discharge.  Ardis Hughs, NP 08/01/21  10:55 AM

## 2021-08-01 NOTE — Progress Notes (Signed)
Received [name] on [facility], she was cooperative with the admission process and afterwards received her penicillin injection.She was medicated per order and returned to her room. She endorsed feeling anxious and depressed, but denied feeling suicidal.

## 2021-08-01 NOTE — ED Notes (Signed)
Pt laying down with eyes closed at this hour. No apparent distress. RR even and unlabored. Monitored for safety.

## 2021-08-01 NOTE — Progress Notes (Signed)
Rx: Brief note Cephalexin allergy:  Patient ordered PCN G.  She said she has had PCN before and tolerated it with no problems.   Thanks Lorenza Evangelist 08/01/2021 12:42 PM

## 2021-08-02 DIAGNOSIS — F1990 Other psychoactive substance use, unspecified, uncomplicated: Secondary | ICD-10-CM | POA: Diagnosis not present

## 2021-08-02 LAB — T.PALLIDUM AB, TOTAL: T Pallidum Abs: NONREACTIVE

## 2021-08-02 LAB — GC/CHLAMYDIA PROBE AMP (~~LOC~~) NOT AT ARMC
Chlamydia: NEGATIVE
Comment: NEGATIVE
Comment: NORMAL
Neisseria Gonorrhea: NEGATIVE

## 2021-08-02 MED ORDER — NICOTINE 21 MG/24HR TD PT24
21.0000 mg | MEDICATED_PATCH | Freq: Every day | TRANSDERMAL | Status: DC
  Administered 2021-08-02 – 2021-08-03 (×2): 21 mg via TRANSDERMAL
  Filled 2021-08-02 (×2): qty 1

## 2021-08-02 MED ORDER — MELATONIN 3 MG PO TABS
3.0000 mg | ORAL_TABLET | Freq: Every day | ORAL | Status: DC
  Administered 2021-08-02: 3 mg via ORAL
  Filled 2021-08-02: qty 1

## 2021-08-02 NOTE — Progress Notes (Signed)
Patient complaining of stomach cramps, muscle aches and anxiety.  Po PRN's given now.  650mg  tylenol, 25mg  vistaril, 500mg  methocarbamol, and 20mg  dicyclomine PO PRN to alleviate opiate withdrawal symptoms.  Will monitor.

## 2021-08-02 NOTE — ED Notes (Signed)
Pt is in the bed awake. Respirations are even and unlabored. No acute distress noted. Will continue to monitor for safety. 

## 2021-08-02 NOTE — ED Notes (Signed)
Patient remains asleep in bed without complaint at this time.  Will continue to monitor and provide a safe environment

## 2021-08-02 NOTE — ED Notes (Signed)
Pt is in the bed sleeping. Respirations are even and unlabored. No acute distress noted. Will continue to monitor safety. 

## 2021-08-02 NOTE — ED Notes (Signed)
Pt came to nurse and complained about nausea. Pt requested for medication to help with nausea.

## 2021-08-02 NOTE — Progress Notes (Signed)
Patient remains asleep in bed.  She reports opiate withdrawal symptoms of exhaustion and stomach cramps.  Patient received bentyl and robaxin along with standing clonidine.  RN encouraged PO fluids.  Will continue to monitor and provide safe supportive environment for her.

## 2021-08-02 NOTE — ED Notes (Signed)
Pt sleeping@this time. Breathing even and unlabored. Will continue to monitor for safety 

## 2021-08-02 NOTE — Progress Notes (Signed)
Patient got up from bed and made a phone call to her mom.  After the phone call she appeared at nursing station and was tearful.  She stated she was "depressed."  RN went with patient to court yard and we talked about her feelings and her sadness around relapse ongoing addiction issues and her new diagnosis of syphilis.  Patient accepted support and allowed for reframing of her thoughts and ideas.  She reported feeling better after conversation and we returned inside to unit.  She asked for food and was given dinner early.  She ate 2 meals and was given drinks and a dessert.  Patient watched tv for a short time and then returned to her room where she remains at this time.  Affect dysphoric mood sad and avoidant.  However, she has been appropriate today and has made needs known clearly and respectfully.  Will monitor and provide safe environment, monitor for withdrawal and treat symptoms according to PRN orders.
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
## 2021-08-03 ENCOUNTER — Telehealth (HOSPITAL_COMMUNITY): Payer: Self-pay | Admitting: Licensed Clinical Social Worker

## 2021-08-03 ENCOUNTER — Telehealth: Payer: Self-pay

## 2021-08-03 DIAGNOSIS — F1990 Other psychoactive substance use, unspecified, uncomplicated: Secondary | ICD-10-CM | POA: Diagnosis not present

## 2021-08-03 MED ORDER — GABAPENTIN 300 MG PO CAPS: 300.0000 mg | ORAL_CAPSULE | Freq: Three times a day (TID) | ORAL | 0 refills | Status: AC

## 2021-08-03 MED ORDER — NICOTINE 21 MG/24HR TD PT24: 21.0000 mg | MEDICATED_PATCH | Freq: Every day | TRANSDERMAL | 0 refills | Status: DC

## 2021-08-03 MED ORDER — NICOTINE POLACRILEX 2 MG MT GUM: 2.0000 mg | CHEWING_GUM | OROMUCOSAL | 0 refills | Status: DC | PRN

## 2021-08-03 MED ORDER — OLANZAPINE 5 MG PO TABS: 5.0000 mg | ORAL_TABLET | Freq: Every day | ORAL | 0 refills | Status: AC

## 2021-08-03 MED ORDER — MELATONIN 3 MG PO TABS
3.0000 mg | ORAL_TABLET | Freq: Every day | ORAL | 0 refills | Status: AC
Start: 2021-08-03 — End: 2021-09-02

## 2021-08-03 MED ORDER — OLANZAPINE 5 MG PO TABS
5.0000 mg | ORAL_TABLET | Freq: Once | ORAL | Status: AC
  Administered 2021-08-03: 5 mg via ORAL
  Filled 2021-08-03: qty 1

## 2021-08-03 MED ORDER — MELATONIN 3 MG PO TABS: 3.0000 mg | ORAL_TABLET | Freq: Every day | ORAL | 0 refills | Status: DC

## 2021-08-03 MED ORDER — OLANZAPINE 5 MG PO TABS: 5.0000 mg | ORAL_TABLET | Freq: Every day | ORAL | 0 refills | Status: DC

## 2021-08-03 MED ORDER — GABAPENTIN 300 MG PO CAPS
300.0000 mg | ORAL_CAPSULE | Freq: Three times a day (TID) | ORAL | Status: DC
  Administered 2021-08-03: 300 mg via ORAL
  Filled 2021-08-03: qty 1

## 2021-08-03 MED ORDER — NICOTINE POLACRILEX 2 MG MT GUM: 2.0000 mg | CHEWING_GUM | OROMUCOSAL | 0 refills | Status: AC | PRN

## 2021-08-03 MED ORDER — NICOTINE 21 MG/24HR TD PT24: 21.0000 mg | MEDICATED_PATCH | Freq: Every day | TRANSDERMAL | 0 refills | Status: AC

## 2021-08-03 NOTE — Telephone Encounter (Signed)
DR Hazle Quant  wants to have pt  start OUD clinic she will be new to [place] .Marland Kitchen She has a NHFU  on 7/27 also with [place]

## 2021-08-03 NOTE — ED Notes (Signed)
Pt is in the bed sleeping. Respirations are even and unlabored. No acute distress noted. Will continue to monitor for safety. 

## 2021-08-03 NOTE — ED Notes (Signed)
Every time this MHT has done 15 mins checks her door is closed. I have told her every time we have to keep the doors open so I can do my rounds and make sure you are okay. She then yelled you wake me up every time (which isn't true she has NOT been asleep since I started my shift.) so I told her I will leave it open more; then she yelled for the RN to come to her room all the way down the hall to tell her I left the door open too much this time. She then asked the RN for meds.

## 2021-08-03 NOTE — ED Notes (Signed)
Pt came to nurse and reported that she is anxious. Pt requested for medication to help with anxiety.

## 2021-08-03 NOTE — ED Notes (Signed)
Pt in group meeting with AA Group 

## 2021-08-03 NOTE — Progress Notes (Signed)
Received [name] this AM asleep in her bed, she woke up on her own, received breakfast and was medicated her per order. She spoke with the provider and Child psychotherapist. Later she received her discharge order. She was given her AVS, questions answered and her prescriptions. She retrieved her clothes and was discharged to the lobby to wait for her mom. She denied all of the psychiatric symptoms and looking forward to the next step.

## 2021-08-03 NOTE — BH Assessment (Signed)
LCSW Progress Note   Per Park Pope, MD, this pt does not require psychiatric hospitalization at this time.  Pt is psychiatrically cleared.  Discharge instructions include information regarding the CD-IOP program at [facility] and that Alene Mires, LCSW will be contacting her to make an appointment.  LCSW advised that she contact the clinic and Mr. Wonda Cerise if she does not hear back from him by early next week.  Other resources for housing, MAT, CD-IOP, and food pantries were provided along with the mobile crisis number.  EDP Park Pope, MD, has been notified.  Hansel Starling, MSW, LCSW [facility] [phone] or [phone]

## 2021-08-03 NOTE — ED Notes (Signed)
Nurse went to remove pt patch but pt was verbally aggressive and told nurse that she has already taken patch off but I was not able to see since pt refused and was being aggressive.

## 2021-08-03 NOTE — Telephone Encounter (Signed)
The therapist attempts to reach [facility] about coordinating aftercare with possible SA IOP attendance. The therapist leaves a HIPAA-compliant voicemail with his direct callback number.   Myrna Blazer, MA, LCSW, [facility], LCAS 08/03/2021

## 2021-08-03 NOTE — ED Notes (Signed)
When getting vitals Pt. Jerked the thermostat away from my hand when trying to get her tempeture.

## 2021-08-03 NOTE — Discharge Instructions (Addendum)
Good morning, [name]!  It was very nice speaking with you. Below is a list of resources that we briefly talked about, and it is tailored to your needs and the insurance coverage you have.  It is imperative follow up with treatment 5-7 days from the day of discharge, or in your case, sometime next week to progress and maintain stabilization and enhance your quality of life.  In case of an urgent emergency, you have the option of contacting the Mobile Crisis Unit with [facility] at [phone].   We discussed CD-IOP (Chemical Dependency Intensive Outpatient Program), a program that meets every Monday, Wednesday, and Friday from 9am to 12pm for 6-8 weeks to develop relapse prevention skills, learn about chemical dependency, mental illness and co-occurring disorders, and set personalized goals with your treatment team.  This program is facilitated by Alison Mires, LCSW.  He will be reaching out to you discuss the program and set an appointment for you to come in for an assessment.  If you do not hear from him by early next week, you can either call the office number listed below or try and contact him at @ [phone]  [facility] Outpatient [address] [place], [place], [address] [phone] phone [name] - [phone] phone   Here are other providers that offer MAT and CD-IOP  OPIOID REPLACEMENT PROVIDERS:  [facility] ([facility]) [address], [place] [address] [phone]  [facility] [address] [place], [place] [address] [phone]  [facility] [address] [place], [place] [address] [phone]     CHARITABLE RESIDENTIAL REHABS  [facility] (women only at this campus) [address] [address] [place], [place] [address] [phone] ((one-time application fee.))   [address], [place] [address] [phone]  [facility] [phone] [address], [place] [address] [phone], [place] [address] [phone]   RESIDENTIAL TREATMENT- PRIVATE PAY:  [facility] Division - [place], [place] [phone] Women's Division - [place], [place] [phone]   [facility] (accept [employer] for some services) [address] [place], [place] [address] [phone]   OUTPATIENT PROGRAMS with CD-IOP:  [facility] ([facility]) [address], [place] [address] [phone]  ((CD-IOP is currently not operational; Opioid replacement clinic is operational))   [facility] (private insurance) [address], [place] [address] [phone]  ((CD-IOP (afternoon program); individual therapy))   [facility] [employer] (Medicaid for [employer] and [employer]; some private insurance) [address] [place], [place] [address] [phone]  ((CD-IOP))   [facility] & private insurance) [phone] [address] [place], [place] [address] [phone]  ((CD-IOP (morning & evening programs); individual therapy))   HALFWAY HOUSES:  [facility] [facility] [employer] [phone]  [employer].oxfordvacancies.com    12 STEP PROGRAMS:  Alcoholics Anonymous of [place] Software[employer]alet.be  Narcotics Anonymous of [place] HitProtect.dk  Al-Anon of [place], [place] www.[place]alanon.org/find-meetings.html  Nar-Anon https://nar-anon.org/find-a-meeting   Family Resources  [facility] [phone] phone, [phone]  Includes: Long-term coaching, financial education, career development training, life skills classes, individual and family counseling, GED preparation and other, educational supports, health and wellness supports, childcare resources and parenting education  workshops, and non-traditional hours.   You have the option to  apply at HUD for Section 8 housing even if there is a waiting list.  HUD will usually have a list of subsidized housing available in the area that you can go to on your own and apply for a lease.  [facility] [address] [place], [place], [address] [phone] phone www.gha-[place].[employer] manufacturing systems, Inc. [address] [place], [place] [address] [phone] phone  [place] - [phone] [address] [place], [place], [address] Some 1-bedrooms open as of 01 August 2021.   You may have to leave a voice message as the individual you need to speak with may be working in a different area on some days.   Fair Housing - [place]  To report landlord-tenant concerns or report housing discrimination, please call [phone] or visit https://www.[place].gov/departments/human-rights/fair-housing.   Fair Housing - [employer]  Learn more about fair housing resources in [employer] by visiting: https://[place]-highpoint.civicplus.com/2176/Fair-Housing.  [facility] & Community Studies  The Eviction Mediation Project provides relief to an overburdened court system. The mediation service is available to any [facility] or landlord involved in a case where a tenant is at risk of eviction or where eviction papers have been filed.   For eviction mediation assistance, please contact Rutha Bouchard at [email or web address] or at [phone].     [employer] [address] [place], [place] [address] [phone] The [employer] provides basic home furnishings at no cost to qualified families & individuals who are transitioning from homelessness, fleeing abuse, recovering from flood or fire or resettling refugees. A referral from a partner agency (local school, faith community, social agency) is  required.  The [employer] of [employer]  [address], [place], [place] [address] [phone]    Food Pantries  [facility] [address] [place], [place], [place] [address] [phone] ext. [phone] Visit or call between 8:30am-5pm Eligibility: Valid photo ID & Social Engineer, drilling.greensborourbanministry.[facility] Food Pantry [address], [place], [place] [address] [phone] Wed. & Sat. 2-6pm; appointments preferred Eligibility: Valid Photo ID with accurate address  Helping [employer] [facility] [address] [place], [place] [address] (Appointments required for food)  [phone] Tues. 11pm-4pm; Sat. 12-2pm For utility assistance: MWF [phone] www.helpinghandshp.[facility] [employer] [address], [place], [place] [address] [phone] EnviroConcern.si Thurs. 2-4pm Picture ID, proof of residency in [facility]  [address] [place], [place] [address] [phone] Mon.-Fri. 1:00-4:30pm All assistance is first come, first served and  requires documentation. Call for info. https://southernusa.salvationarmy.org/high-point/  [employer] of [place] [address] [place], [place] [address] [phone] Tuesday and Thursday 9am - 12pm Eligibility: No referral needed [email or web address]/[email or web address]     To see which pharmacy near you is the CHEAPEST for certain medications, please use GoodRx. It is free website and has a free phone app.       Also consider looking at [facility] $4.00 or Public's $7.00 prescription list. Both are free to view if googled "walmart $4 prescription" and "public's $7 prescription". These are set prices, no insurance required.   For issues with sleep, please use this free app for insomnia called CBT-I. Let your doctors and therapists know so they can help with extra tips and tricks or for guidance and accountability. NO  ADDS on the app.     For therapy outside the hospital,  please ask for these specific types of therapy: CBT   Please make regular appointments with an outpatient psychiatrist and other doctors once you leave the hospital.    Suicide hotline: 988 Emergency: 911   Medication Assistance Program [facility] Department of Public Health [address] [place], [place], [address] [phone] phone   Check to see if you are eligible for [place] [facility]ist @ GolfingPosters.tn or call [phone].
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
## 2021-08-06 NOTE — Telephone Encounter (Signed)
OUD Intake: Patient started conversation stating, "I'm so high right now."  Drug of choice: "Heroin"   How long: Age [age]  Other substances: Fentanyl, xanax, marijuana for anxiety. "I have been on and off every drug there is."  Currently on suboxone: No, last suboxone 3-4 days ago. "Can't afford to buy it off my friend."   Interested in treatment: Yes  Around users.: No  Social support: No, has only lived in this area for 1 year.   Other: "Trying to get my son back."  Patient was given HFU for 08/09/21 at 1:15 by FO

## 2021-08-09 ENCOUNTER — Encounter: Payer: Self-pay | Admitting: Student

## 2021-08-09 ENCOUNTER — Ambulatory Visit (INDEPENDENT_AMBULATORY_CARE_PROVIDER_SITE_OTHER): Payer: Medicare Other | Admitting: Student

## 2021-08-09 VITALS — BP 103/68 | HR 111 | Temp 98.1°F | Ht 66.0 in | Wt 131.7 lb

## 2021-08-09 DIAGNOSIS — F1910 Other psychoactive substance abuse, uncomplicated: Secondary | ICD-10-CM

## 2021-08-09 DIAGNOSIS — F411 Generalized anxiety disorder: Secondary | ICD-10-CM | POA: Diagnosis not present

## 2021-08-09 DIAGNOSIS — R7989 Other specified abnormal findings of blood chemistry: Secondary | ICD-10-CM

## 2021-08-09 MED ORDER — BUPRENORPHINE HCL-NALOXONE HCL 8-2 MG SL SUBL: 1.0000 | SUBLINGUAL_TABLET | Freq: Three times a day (TID) | SUBLINGUAL | 0 refills | Status: DC

## 2021-08-09 NOTE — Patient Instructions (Signed)
Galaxy, Borden you for allowing me to take part in your care today.  Here are your instructions.  1.  Regarding your low TSH level, I will obtain new TSH with free T4 to check your thyroid levels.  Please await my phone call for results.  2.  Regarding psychiatry, I will give you the phone number to [facility] behavioral medicine.  Their phone number is [phone]  3.  Regarding opioid use disorder, I will start you on Suboxone for the next 2 weeks.  I will send in Suboxone 8 mg and attempt to do tablets.  If not I will send films.  4.  I have also obtained urine for a drug screen today.  I will inform you of the results via phone call.  Thank you, Dr. Allena Katz  If you have any other questions please contact the internal medicine clinic at [phone]

## 2021-08-09 NOTE — Progress Notes (Signed)
TSH low, lipid screen normal, a1c screen 5.3%, CMP,CBC normal, HIV T pall ab negative, drug screen positive for benzo/suboxone, amphetamine, cocaine

## 2021-08-09 NOTE — Assessment & Plan Note (Addendum)
Patient presents to establish care with OUD clinic.  Patient reports that she usually takes 8 mg films of Suboxone 3 times daily back in [place].  Patient reports that she is now down here and needs to be enrolled to a Suboxone clinic.  Patient reports that her drug of choice is heroin.  She also does meth.  She reports that she last used yesterday.  She reports that she took her last Suboxone film today.  Patient's COWS score is 8.  Pain contract details discussed with patient and she is agreeable to this.  Patient is appropriate for Suboxone at this time.   Plan: - Suboxone 8 mg 3 times daily for the next 2 weeks - Pain contract agreement  -UDS pending  -Have patient return in 2 weeks for recheck   Addendum: UDS showing amphetamine, suboxone, cocaine, fentanyl, Methamphetamine, Carboxyl THC.
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
## 2021-08-09 NOTE — Assessment & Plan Note (Signed)
Patient was recently seen in the behavioral health emergency department and was prescribed Zyprexa.  She was also referred to psychiatry.  Patient reports that she is not seen psychiatry yet.  Patient notes that she will not see a psychiatrist unless they strictly prescribe her Xanax.  I counseled patient stating that they have many options and she becomes agreeable to see psychiatry.  Plan: - Patient is given [facility] phone number - Continue Zyprexa 5 mg daily

## 2021-08-09 NOTE — Assessment & Plan Note (Addendum)
During her ED visit at the behavioral Health Center, patient was found to have a low TSH.  Free T4 was never ordered.    Plan: - Obtain TSH with free T4 today  Addendum: - TSH came back low, with normal free T4.  We will continue to monitor.

## 2021-08-10 ENCOUNTER — Telehealth: Payer: Self-pay | Admitting: *Deleted

## 2021-08-10 ENCOUNTER — Telehealth: Payer: Self-pay | Admitting: Student

## 2021-08-10 LAB — TSH+FREE T4
Free T4: 1.53 ng/dL (ref 0.82–1.77)
TSH: 0.438 u[IU]/mL — ABNORMAL LOW (ref 0.450–4.500)

## 2021-08-10 MED ORDER — BUPRENORPHINE HCL-NALOXONE HCL 8-2 MG SL SUBL: 1.0000 | SUBLINGUAL_TABLET | Freq: Two times a day (BID) | SUBLINGUAL | 0 refills | Status: AC

## 2021-08-10 NOTE — Telephone Encounter (Signed)
Received call from pharmacist at [facility]. He is questioning Rx for suboxone 8-2 TID. States this is a very high MME. Please advise if this is correct dosing.

## 2021-08-10 NOTE — Addendum Note (Signed)
Addended by: Derrek Monaco on: 08/10/2021 03:26 PM   Modules accepted: Orders

## 2021-08-10 NOTE — Telephone Encounter (Signed)
I called patient regarding her lab results.  Drug test is still pending.  Regarding her TSH, it was low again.  Free T4 was normal.  This is likely subclinical hyperthyroidism.  We will observe.

## 2021-08-10 NOTE — Telephone Encounter (Signed)
Spoke with Dr. Lafonda Mosses, attending for this patient with Dr. Allena Katz. Please see her addendum from 7/28 in the note. Spoke with pharmacist Romeo Apple who was concerned about induction therapy and patient not actively being monitored per his guidelines. After further discussion on patient's history with suboxone he is agreeable to filling BID dosing.

## 2021-08-10 NOTE — Telephone Encounter (Signed)
Pharmacist notified to fill Rx as written. States he will not fill this Rx without clinical justification from Prescriber. He is requesting call back from Provider.

## 2021-08-13 ENCOUNTER — Telehealth: Payer: Self-pay

## 2021-08-13 NOTE — Progress Notes (Signed)
Internal Medicine Clinic Attending  I saw and evaluated the patient.  I personally confirmed the key portions of the history and exam documented by Dr. Patel and I reviewed pertinent patient test results.  The assessment, diagnosis, and plan were formulated together and I agree with the documentation in the resident's note.  

## 2021-08-16 LAB — TOXASSURE SELECT,+ANTIDEPR,UR

## 2021-08-21 ENCOUNTER — Telehealth: Payer: Self-pay | Admitting: Student

## 2021-08-21 NOTE — Telephone Encounter (Signed)
Spoke on the phone regarding the patients drug test results.I also tell the patient that she is due for a two week appointment in which she needs to come in to get a refill of her suboxone. She states that she will get an appointment coming up in the next day or two to come in.

## 2021-08-24 ENCOUNTER — Encounter: Payer: Medicare Other | Admitting: Internal Medicine

## 2021-10-25 NOTE — Telephone Encounter (Signed)
Done

## 2021-11-12 ENCOUNTER — Telehealth: Payer: Self-pay

## 2021-11-12 ENCOUNTER — Encounter: Payer: Self-pay | Admitting: Student

## 2021-11-12 ENCOUNTER — Ambulatory Visit (INDEPENDENT_AMBULATORY_CARE_PROVIDER_SITE_OTHER): Payer: Medicare Other | Admitting: Student

## 2021-11-12 VITALS — BP 126/90 | HR 96 | Temp 98.1°F | Ht 66.0 in | Wt 129.2 lb

## 2021-11-12 DIAGNOSIS — F1190 Opioid use, unspecified, uncomplicated: Secondary | ICD-10-CM | POA: Diagnosis present

## 2021-11-12 MED ORDER — BUPRENORPHINE HCL-NALOXONE HCL 8-2 MG SL FILM: 1.0000 | ORAL_FILM | Freq: Three times a day (TID) | SUBLINGUAL | 0 refills | Status: DC

## 2021-11-12 NOTE — Assessment & Plan Note (Addendum)
Patient is here to reestablish with our OUD clinic.  She last saw [place] in July and was started on Suboxone 8-2 mg tablets 3 times a day.  Patient lost to follow-up due to family emergency in [place].  She states that the situation has settled and they are moving back to [place].  She said that she has been using amphetamine and cocaine with oral painkillers between July and now.  Said that her last opioid use was about 1 month ago.  She reports withdrawal symptoms including tremors, diaphoresis, anxiety and diarrhea.  She states that she was doing well on the last dose Suboxone which helped control craving.  Her COWS score was 15 which indicates moderate withdrawal.  -Resume Suboxone 8-2 mg TID films.  She did not like the taste of the tablets -Return in 2 weeks for UTOX -Referral placed to our social worker to help with housing and food insecurity  Addendum Notified by pharmacy that patient actually only received Suboxone 8-2 mg twice daily dosing last time.  I reached out to patient multiple times but unsuccessful.  Left voicemail to call back.  I will change her prescription to Suboxone 8-2 mg twice daily for now.

## 2021-11-12 NOTE — Patient Instructions (Addendum)
Ms. Santini,  It was a pleasure seeing you in the clinic today. I am glad that you returned to our clinic to reestablish care.  I started you on Suboxone 8-2 mg film 3 times a day.   I only gave you 2-week supply so please return in 2 weeks for follow-up.  I also placed a referral to our social worker to reach out to you for support.  Take care  Dr. Alfonse Spruce
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
## 2021-11-12 NOTE — Telephone Encounter (Signed)
Incoming fax from pharmacy " Drug not covered by patient plan. The preferred alternative [employer] plan help desk number. Please call/fax pharmacy to change medication along with strength,directions,quantity,and refills

## 2021-11-13 NOTE — Telephone Encounter (Signed)
Incoming fax from pharmacy "Call pharmacy have Dr.Nugyen call [phone] speak with pharmacists"

## 2021-11-13 NOTE — Telephone Encounter (Signed)
Incoming fax from pharmacy "Call pharmacy dose increase 50 percent this is UNSAFE for this medication."

## 2021-11-14 MED ORDER — BUPRENORPHINE HCL-NALOXONE HCL 8-2 MG SL FILM: 1.0000 | ORAL_FILM | Freq: Two times a day (BID) | SUBLINGUAL | 0 refills | Status: AC

## 2021-11-14 NOTE — Addendum Note (Signed)
Addended byGaylan Gerold on: 11/14/2021 02:10 PM   Modules accepted: Orders

## 2021-11-18 NOTE — Progress Notes (Signed)
Internal Medicine Clinic Attending  Case discussed with Dr. Nguyen  at the time of the visit.  We reviewed the resident's history and exam and pertinent patient test results.  I agree with the assessment, diagnosis, and plan of care documented in the resident's note.  

## 2021-11-20 ENCOUNTER — Telehealth: Payer: Self-pay | Admitting: *Deleted

## 2021-11-20 NOTE — Telephone Encounter (Signed)
Message from Pharmacy that patient had a reaction to the Buprenorphine HCL Naloxone tablets and now the film.   Call to patient message left that the Clinics had called to ask about medication reaction.  RTC to Pharmacy to clarify message.

## 2021-11-23 ENCOUNTER — Telehealth: Payer: Self-pay | Admitting: *Deleted

## 2021-11-23 NOTE — Progress Notes (Unsigned)
  Care Coordination  Outreach Note  11/23/2021 Name: [name] MRN: [id number] DOB: [birth year]   Care Coordination Outreach Attempts: An unsuccessful telephone outreach was attempted today to offer the patient information about available care coordination services as a benefit of their health plan.   Follow Up Plan:  Additional outreach attempts will be made to offer the patient care coordination information and services.   Encounter Outcome:  No Answer  Christie Nottingham  Care Coordination Care Guide  Direct Dial: [phone]

## 2021-11-26 NOTE — Progress Notes (Unsigned)
  Care Coordination  Outreach Note  11/26/2021 Name: [name] MRN: [id number] DOB: Nov 09, 1980   Care Coordination Outreach Attempts: A second unsuccessful outreach was attempted today to offer the patient with information about available care coordination services as a benefit of their health plan.     Follow Up Plan:  Additional outreach attempts will be made to offer the patient care coordination information and services.   Encounter Outcome:  No Answer  Christie Nottingham  Care Coordination Care Guide  Direct Dial: [phone]

## 2021-11-27 NOTE — Progress Notes (Deleted)
GAD Patient was recently seen in the behavioral health emergency department and was prescribed Zyprexa.  She was also referred to psychiatry.  Patient reports that she is not seen psychiatry yet.  Patient notes that she will not see a psychiatrist unless they strictly prescribe her Xanax.  I counseled patient stating that they have many options and she becomes agreeable to see psychiatry.   Plan: - Patient is given [facility] phone number - Continue Zyprexa 5 mg daily  OUD Patient is here to reestablish with our OUD clinic.  She last saw [place] in July and was started on Suboxone 8-2 mg tablets 3 times a day.  Patient lost to follow-up due to family emergency in [place].  She states that the situation has settled and they are moving back to [place].  She said that she has been using amphetamine and cocaine with oral painkillers between July and now.  Said that her last opioid use was about 1 month ago.  She reports withdrawal symptoms including tremors, diaphoresis, anxiety and diarrhea.  She states that she was doing well on the last dose Suboxone which helped control craving.   Her COWS score was 15 which indicates moderate withdrawal.   -Resume Suboxone 8-2 mg TID films.  She did not like the taste of the tablets -Return in 2 weeks for UTOX -Referral placed to our social worker to help with housing and food insecurity   Addendum Notified by pharmacy that patient actually only received Suboxone 8-2 mg twice daily dosing last time.  I reached out to patient multiple times but unsuccessful.  Left voicemail to call back.  I will change her prescription to Suboxone 8-2 mg twice daily for now.  Tox assure positive for fentanyl, cocaine, amphetamines, THC  HCM Pap flu

## 2021-11-28 ENCOUNTER — Ambulatory Visit: Payer: Medicare Other | Admitting: Licensed Clinical Social Worker

## 2021-11-28 ENCOUNTER — Telehealth: Payer: Self-pay | Admitting: *Deleted

## 2021-11-28 NOTE — Progress Notes (Signed)
  Care Coordination  Outreach Note  11/28/2021 Name: [name] MRN: [id number] DOB: [birth year]   Care Coordination Outreach Attempts: A third unsuccessful outreach was attempted today to offer the patient with information about available care coordination services as a benefit of their health plan.   Follow Up Plan:  No further outreach attempts will be made at this time. We have been unable to contact the patient to offer or enroll patient in care coordination services  Encounter Outcome:  No Answer  Christie Nottingham  Care Coordination Care Guide  Direct Dial: [phone]

## 2021-11-28 NOTE — Patient Outreach (Signed)
  Care Coordination   Initial Visit Note   11/28/2021 Name: [name] MRN: [id number] DOB: 02/13/1980  [name] is a 41 y.o. year old female who sees Pcp, No for primary care. I spoke with  [name] by phone today.  What matters to the patients health and wellness today?  Housing     Goals Addressed               This Visit's Progress     Care Coordination Activities- Housing (pt-stated)        Patient stated she is currently homeless.  SW offered shelters as resources. Patient refused to go to shelters. Patient refused additional housing resources SW offered.   Patient would only accept financial assistance for hotel. SW explained SW didn't have those resources. Patient stated SW couldn't help her and patient hung up on SW.        SDOH assessments and interventions completed:  Yes  SDOH Interventions Today    Flowsheet Row Most Recent Value  SDOH Interventions   Housing Interventions Patient Refused  [SW offered shelters and additional housing resources]        Care Coordination Interventions Activated:  Yes  Care Coordination Interventions:  Yes, provided     Encounter Outcome:  Pt. Visit Completed

## 2021-11-28 NOTE — Progress Notes (Signed)
  Care Coordination   Note   11/28/2021 Name: [name] MRN: [id number] DOB: [birth year]  [name] is a 41 y.o. year old female who sees Pcp, No for primary care. I reached out to [name] by phone today to offer care coordination services.  [name] was given information about Care Coordination services today including:   The Care Coordination services include support from the care team which includes your Nurse Coordinator, Clinical Social Worker, or Pharmacist.  The Care Coordination team is here to help remove barriers to the health concerns and goals most important to you. Care Coordination services are voluntary, and the patient may decline or stop services at any time by request to their care team member.   Care Coordination Consent Status: Patient agreed to services and verbal consent obtained.   Follow up plan:  Telephone appointment with care coordination team member scheduled for:  11/28/21  Encounter Outcome:  Pt. Scheduled  [facility] Coordination Care Guide  Direct Dial: [phone]

## 2022-06-11 ENCOUNTER — Other Ambulatory Visit: Payer: Self-pay

## 2022-06-11 ENCOUNTER — Encounter (HOSPITAL_COMMUNITY): Payer: Self-pay | Admitting: Emergency Medicine

## 2022-06-11 ENCOUNTER — Emergency Department (HOSPITAL_COMMUNITY)
Admission: EM | Admit: 2022-06-11 | Discharge: 2022-06-11 | Disposition: A | Discharge status: Home / Self Care | Payer: No Typology Code available for payment source | Attending: Emergency Medicine | Admitting: Emergency Medicine

## 2022-06-11 ENCOUNTER — Emergency Department (HOSPITAL_COMMUNITY): Payer: No Typology Code available for payment source

## 2022-06-11 DIAGNOSIS — R0689 Other abnormalities of breathing: Secondary | ICD-10-CM | POA: Diagnosis not present

## 2022-06-11 DIAGNOSIS — S0990XA Unspecified injury of head, initial encounter: Secondary | ICD-10-CM | POA: Diagnosis not present

## 2022-06-11 DIAGNOSIS — S199XXA Unspecified injury of neck, initial encounter: Secondary | ICD-10-CM | POA: Diagnosis not present

## 2022-06-11 DIAGNOSIS — T50904A Poisoning by unspecified drugs, medicaments and biological substances, undetermined, initial encounter: Secondary | ICD-10-CM | POA: Diagnosis not present

## 2022-06-11 DIAGNOSIS — W19XXXA Unspecified fall, initial encounter: Secondary | ICD-10-CM | POA: Diagnosis not present

## 2022-06-11 DIAGNOSIS — R0902 Hypoxemia: Secondary | ICD-10-CM | POA: Diagnosis not present

## 2022-06-11 DIAGNOSIS — T887XXA Unspecified adverse effect of drug or medicament, initial encounter: Secondary | ICD-10-CM | POA: Diagnosis not present

## 2022-06-11 DIAGNOSIS — T401X1A Poisoning by heroin, accidental (unintentional), initial encounter: Secondary | ICD-10-CM | POA: Insufficient documentation

## 2022-06-11 LAB — I-STAT CHEM 8, ED
BUN: 23 mg/dL — ABNORMAL HIGH (ref 6–20)
Calcium, Ion: 1.17 mmol/L (ref 1.15–1.40)
Chloride: 103 mmol/L (ref 98–111)
Creatinine, Ser: 1.2 mg/dL — ABNORMAL HIGH (ref 0.44–1.00)
Glucose, Bld: 83 mg/dL (ref 70–99)
HCT: 36 % (ref 36.0–46.0)
Hemoglobin: 12.2 g/dL (ref 12.0–15.0)
Potassium: 4.2 mmol/L (ref 3.5–5.1)
Sodium: 138 mmol/L (ref 135–145)
TCO2: 27 mmol/L (ref 22–32)

## 2022-06-11 LAB — CBC WITH DIFFERENTIAL/PLATELET
Abs Immature Granulocytes: 0.02 10*3/uL (ref 0.00–0.07)
Basophils Absolute: 0.1 10*3/uL (ref 0.0–0.1)
Basophils Relative: 1 %
Eosinophils Absolute: 0.1 10*3/uL (ref 0.0–0.5)
Eosinophils Relative: 1 %
HCT: 37.6 % (ref 36.0–46.0)
Hemoglobin: 12.6 g/dL (ref 12.0–15.0)
Immature Granulocytes: 0 %
Lymphocytes Relative: 42 %
Lymphs Abs: 3.8 10*3/uL (ref 0.7–4.0)
MCH: 29.2 pg (ref 26.0–34.0)
MCHC: 33.5 g/dL (ref 30.0–36.0)
MCV: 87.2 fL (ref 80.0–100.0)
Monocytes Absolute: 0.7 10*3/uL (ref 0.1–1.0)
Monocytes Relative: 7 %
Neutro Abs: 4.4 10*3/uL (ref 1.7–7.7)
Neutrophils Relative %: 49 %
Platelets: 253 10*3/uL (ref 150–400)
RBC: 4.31 MIL/uL (ref 3.87–5.11)
RDW: 13.4 % (ref 11.5–15.5)
WBC: 9 10*3/uL (ref 4.0–10.5)
nRBC: 0 % (ref 0.0–0.2)

## 2022-06-11 LAB — I-STAT BETA HCG BLOOD, ED (MC, WL, AP ONLY): I-stat hCG, quantitative: <5 m[IU]/mL (ref <5)

## 2022-06-11 MED ORDER — ONDANSETRON HCL 4 MG/2ML IJ SOLN
4.0000 mg | Freq: Once | INTRAMUSCULAR | Status: AC
  Administered 2022-06-11: 4 mg via INTRAVENOUS
  Filled 2022-06-11: qty 2

## 2022-06-11 NOTE — ED Provider Notes (Signed)
[place] EMERGENCY DEPARTMENT AT [facility] Provider Note   CSN: [id number] Arrival date & time: 06/11/22  0416     History  Chief Complaint  Patient presents with   Drug Overdose    Pt bib EMS from [employer] after snorting herion. Given 0.5mg  narcan by EMS. 20 G LAC. BG 110. ETOH    [name] is a 42 y.o. female.  The history is provided by the patient and the EMS personnel.  Drug Overdose This is a new problem. The current episode started less than 1 hour ago. The problem occurs constantly. The problem has been resolved. Pertinent negatives include no chest pain, no abdominal pain, no headaches and no shortness of breath. Nothing aggravates the symptoms. Treatments tried: narcan. The treatment provided significant relief.  Patient found down in a rest room at a gas station.  Narcan given by EMS     Home Medications Prior to Admission medications   Medication Sig Start Date End Date Taking? Authorizing Provider  gabapentin (NEURONTIN) 300 MG capsule Take 1 capsule (300 mg total) by mouth 3 (three) times daily. 08/03/21 09/02/21  Park Pope, MD  nicotine (NICODERM CQ - DOSED IN MG/24 HOURS) 21 mg/24hr patch Place 1 patch (21 mg total) onto the skin daily. 08/04/21   Park Pope, MD  nicotine polacrilex (NICORETTE) 2 MG gum Take 1 each (2 mg total) by mouth as needed for smoking cessation. 08/03/21   Park Pope, MD  OLANZapine (ZYPREXA) 5 MG tablet Take 1 tablet (5 mg total) by mouth at bedtime. 08/03/21 09/02/21  Park Pope, MD      Allergies    Cephalexin    Review of Systems   Review of Systems  Respiratory:  Negative for shortness of breath.   Cardiovascular:  Negative for chest pain.  Gastrointestinal:  Positive for vomiting. Negative for abdominal pain.  Neurological:  Negative for headaches.    Physical Exam Updated Vital Signs BP 113/83   Pulse 63   Temp 98.2 F (36.8 C) (Oral)   Resp 11   SpO2 98%  Physical Exam Vitals and nursing note  reviewed. Exam conducted with a chaperone present.  Constitutional:      General: She is not in acute distress.    Appearance: Normal appearance. She is well-developed.  HENT:     Head: Normocephalic and atraumatic.     Nose: Nose normal.     Mouth/Throat:     Mouth: Mucous membranes are moist.     Pharynx: Oropharynx is clear.  Eyes:     Pupils: Pupils are equal, round, and reactive to light.  Cardiovascular:     Rate and Rhythm: Normal rate and regular rhythm.     Pulses: Normal pulses.     Heart sounds: Normal heart sounds.  Pulmonary:     Effort: Pulmonary effort is normal. No respiratory distress.     Breath sounds: Normal breath sounds.  Abdominal:     General: Abdomen is flat. Bowel sounds are normal. There is no distension.     Palpations: Abdomen is soft.     Tenderness: There is no abdominal tenderness. There is no guarding or rebound.  Genitourinary:    Vagina: No vaginal discharge.  Musculoskeletal:        General: Normal range of motion.     Cervical back: Normal range of motion and neck supple.  Skin:    General: Skin is warm and dry.     Capillary Refill: Capillary refill  takes less than 2 seconds.     Findings: No erythema or rash.  Neurological:     General: No focal deficit present.     Mental Status: She is alert and oriented to person, place, and time.     Deep Tendon Reflexes: Reflexes normal.  Psychiatric:        Mood and Affect: Mood normal.     ED Results / Procedures / Treatments   Labs (all labs ordered are listed, but only abnormal results are displayed) Results for orders placed or performed during the hospital encounter of 06/11/22  CBC with Differential  Result Value Ref Range   WBC 9.0 4.0 - 10.5 K/uL   RBC 4.31 3.87 - 5.11 MIL/uL   Hemoglobin 12.6 12.0 - 15.0 g/dL   HCT 56.4 33.2 - 95.1 %   MCV 87.2 80.0 - 100.0 fL   MCH 29.2 26.0 - 34.0 pg   MCHC 33.5 30.0 - 36.0 g/dL   RDW 88.4 16.6 - 06.3 %   Platelets 253 150 - 400 K/uL    nRBC 0.0 0.0 - 0.2 %   Neutrophils Relative % 49 %   Neutro Abs 4.4 1.7 - 7.7 K/uL   Lymphocytes Relative 42 %   Lymphs Abs 3.8 0.7 - 4.0 K/uL   Monocytes Relative 7 %   Monocytes Absolute 0.7 0.1 - 1.0 K/uL   Eosinophils Relative 1 %   Eosinophils Absolute 0.1 0.0 - 0.5 K/uL   Basophils Relative 1 %   Basophils Absolute 0.1 0.0 - 0.1 K/uL   Immature Granulocytes 0 %   Abs Immature Granulocytes 0.02 0.00 - 0.07 K/uL  I-stat chem 8, ED (not at [facility], [facility] or [facility])  Result Value Ref Range   Sodium 138 135 - 145 mmol/L   Potassium 4.2 3.5 - 5.1 mmol/L   Chloride 103 98 - 111 mmol/L   BUN 23 (H) 6 - 20 mg/dL   Creatinine, Ser 0.16 (H) 0.44 - 1.00 mg/dL   Glucose, Bld 83 70 - 99 mg/dL   Calcium, Ion 0.10 9.32 - 1.40 mmol/L   TCO2 27 22 - 32 mmol/L   Hemoglobin 12.2 12.0 - 15.0 g/dL   HCT 35.5 73.2 - 20.2 %  I-Stat beta hCG blood, ED ([facility], [facility], [facility] only)  Result Value Ref Range   I-stat hCG, quantitative <5.0 <5 mIU/mL   Comment 3           CT Head Wo Contrast  Result Date: 06/11/2022 CLINICAL DATA:  Blunt poly trauma. EXAM: CT HEAD WITHOUT CONTRAST CT CERVICAL SPINE WITHOUT CONTRAST TECHNIQUE: Multidetector CT imaging of the head and cervical spine was performed following the standard protocol without intravenous contrast. Multiplanar CT image reconstructions of the cervical spine were also generated. RADIATION DOSE REDUCTION: This exam was performed according to the departmental dose-optimization program which includes automated exposure control, adjustment of the mA and/or kV according to patient size and/or use of iterative reconstruction technique. COMPARISON:  Head CT 09/04/2011 FINDINGS: CT HEAD FINDINGS Brain: No evidence of acute infarction, hemorrhage, hydrocephalus, extra-axial collection or mass lesion/mass effect. Vascular: No hyperdense vessel or unexpected calcification. Skull: Normal. Negative for fracture or focal lesion. Sinuses/Orbits: No acute finding. CT CERVICAL SPINE  FINDINGS Alignment: No traumatic malalignment. Reversal of cervical lordosis. Skull base and vertebrae: No acute fracture. No primary bone lesion or focal pathologic process. Soft tissues and spinal canal: No prevertebral fluid or swelling. No visible canal hematoma. Disc levels:  Mild degenerative spurring Upper chest:  No evidence of injury Other: Artifact from necklace. IMPRESSION: No evidence of acute intracranial or cervical spine injury. Electronically Signed   By: Tiburcio Pea M.D.   On: 06/11/2022 06:01   CT Cervical Spine Wo Contrast  Result Date: 06/11/2022 CLINICAL DATA:  Blunt poly trauma. EXAM: CT HEAD WITHOUT CONTRAST CT CERVICAL SPINE WITHOUT CONTRAST TECHNIQUE: Multidetector CT imaging of the head and cervical spine was performed following the standard protocol without intravenous contrast. Multiplanar CT image reconstructions of the cervical spine were also generated. RADIATION DOSE REDUCTION: This exam was performed according to the departmental dose-optimization program which includes automated exposure control, adjustment of the mA and/or kV according to patient size and/or use of iterative reconstruction technique. COMPARISON:  Head CT 09/04/2011 FINDINGS: CT HEAD FINDINGS Brain: No evidence of acute infarction, hemorrhage, hydrocephalus, extra-axial collection or mass lesion/mass effect. Vascular: No hyperdense vessel or unexpected calcification. Skull: Normal. Negative for fracture or focal lesion. Sinuses/Orbits: No acute finding. CT CERVICAL SPINE FINDINGS Alignment: No traumatic malalignment. Reversal of cervical lordosis. Skull base and vertebrae: No acute fracture. No primary bone lesion or focal pathologic process. Soft tissues and spinal canal: No prevertebral fluid or swelling. No visible canal hematoma. Disc levels:  Mild degenerative spurring Upper chest: No evidence of injury Other: Artifact from necklace. IMPRESSION: No evidence of acute intracranial or cervical spine injury.  Electronically Signed   By: Tiburcio Pea M.D.   On: 06/11/2022 06:01     Radiology CT Head Wo Contrast  Result Date: 06/11/2022 CLINICAL DATA:  Blunt poly trauma. EXAM: CT HEAD WITHOUT CONTRAST CT CERVICAL SPINE WITHOUT CONTRAST TECHNIQUE: Multidetector CT imaging of the head and cervical spine was performed following the standard protocol without intravenous contrast. Multiplanar CT image reconstructions of the cervical spine were also generated. RADIATION DOSE REDUCTION: This exam was performed according to the departmental dose-optimization program which includes automated exposure control, adjustment of the mA and/or kV according to patient size and/or use of iterative reconstruction technique. COMPARISON:  Head CT 09/04/2011 FINDINGS: CT HEAD FINDINGS Brain: No evidence of acute infarction, hemorrhage, hydrocephalus, extra-axial collection or mass lesion/mass effect. Vascular: No hyperdense vessel or unexpected calcification. Skull: Normal. Negative for fracture or focal lesion. Sinuses/Orbits: No acute finding. CT CERVICAL SPINE FINDINGS Alignment: No traumatic malalignment. Reversal of cervical lordosis. Skull base and vertebrae: No acute fracture. No primary bone lesion or focal pathologic process. Soft tissues and spinal canal: No prevertebral fluid or swelling. No visible canal hematoma. Disc levels:  Mild degenerative spurring Upper chest: No evidence of injury Other: Artifact from necklace. IMPRESSION: No evidence of acute intracranial or cervical spine injury. Electronically Signed   By: Tiburcio Pea M.D.   On: 06/11/2022 06:01   CT Cervical Spine Wo Contrast  Result Date: 06/11/2022 CLINICAL DATA:  Blunt poly trauma. EXAM: CT HEAD WITHOUT CONTRAST CT CERVICAL SPINE WITHOUT CONTRAST TECHNIQUE: Multidetector CT imaging of the head and cervical spine was performed following the standard protocol without intravenous contrast. Multiplanar CT image reconstructions of the cervical spine were  also generated. RADIATION DOSE REDUCTION: This exam was performed according to the departmental dose-optimization program which includes automated exposure control, adjustment of the mA and/or kV according to patient size and/or use of iterative reconstruction technique. COMPARISON:  Head CT 09/04/2011 FINDINGS: CT HEAD FINDINGS Brain: No evidence of acute infarction, hemorrhage, hydrocephalus, extra-axial collection or mass lesion/mass effect. Vascular: No hyperdense vessel or unexpected calcification. Skull: Normal. Negative for fracture or focal lesion. Sinuses/Orbits: No acute finding. CT CERVICAL SPINE  FINDINGS Alignment: No traumatic malalignment. Reversal of cervical lordosis. Skull base and vertebrae: No acute fracture. No primary bone lesion or focal pathologic process. Soft tissues and spinal canal: No prevertebral fluid or swelling. No visible canal hematoma. Disc levels:  Mild degenerative spurring Upper chest: No evidence of injury Other: Artifact from necklace. IMPRESSION: No evidence of acute intracranial or cervical spine injury. Electronically Signed   By: Tiburcio Pea M.D.   On: 06/11/2022 06:01    Procedures Procedures    Medications Ordered in ED Medications  ondansetron (ZOFRAN) injection 4 mg (4 mg Intravenous Given 06/11/22 0433)    ED Course/ Medical Decision Making/ A&P                             Medical Decision Making Overdose on heroin in a bathroom of a gas station   Amount and/or Complexity of Data Reviewed Independent Historian: EMS    Details: Patient with an accidental overdose trying to get high  Labs: ordered.    Details: All labs reviewed: normal white count 9, normal hemoglobin 12.6, normal platelet count. Negative pregnancy. Normal sodium 138, normal potassium 4.2, creatinine slight elevation 1.2  Radiology: ordered and independent interpretation performed.  Risk Risk Details: Accidental overdose. Awake alert, PO challenging and ambulating about the  department    Final Clinical Impression(s) / ED Diagnoses Final diagnoses:  Accidental overdose of heroin, initial encounter [facility])   Return for intractable cough, coughing up blood, fevers > 100.4 unrelieved by medication, shortness of breath, intractable vomiting, chest pain, shortness of breath, weakness, numbness, changes in speech, facial asymmetry, abdominal pain, passing out, Inability to tolerate liquids or food, cough, altered mental status or any concerns. No signs of systemic illness or infection. The patient is nontoxic-appearing on exam and vital signs are within normal limits.  I have reviewed the triage vital signs and the nursing notes. Pertinent labs & imaging results that were available during my care of the patient were reviewed by me and considered in my medical decision making (see chart for details). After history, exam, and medical workup I feel the patient has been appropriately medically screened and is safe for discharge home. Pertinent diagnoses were discussed with the patient. Patient was given return precautions.  Rx / DC Orders ED Discharge Orders     None         Kanishk Stroebel, MD 06/11/22 [phone]

## 2022-06-11 NOTE — ED Notes (Signed)
Pt tolerating PO fluids

## 2022-06-11 NOTE — ED Notes (Signed)
Pt transported to CT ?

## 2022-06-11 NOTE — ED Notes (Signed)
Pt ambulated in room. Pt was able to maintain steady and equal gait, without staff assist.

## 2022-06-11 NOTE — ED Triage Notes (Signed)
Pt bib EMS from [employer] after snorting herion. Given 0.5mg  narcan by EMS. 20 G LAC. BG 110. ETOH on board occasional use and marijuana, pt states 4 cans of beer consumed prior to herion use.

## 2022-11-02 ENCOUNTER — Emergency Department (HOSPITAL_BASED_OUTPATIENT_CLINIC_OR_DEPARTMENT_OTHER)
Admission: EM | Admit: 2022-11-02 | Discharge: 2022-11-02 | Disposition: A | Discharge status: Home / Self Care | Payer: No Typology Code available for payment source | Attending: Emergency Medicine | Admitting: Emergency Medicine

## 2022-11-02 ENCOUNTER — Encounter (HOSPITAL_BASED_OUTPATIENT_CLINIC_OR_DEPARTMENT_OTHER): Payer: Self-pay | Admitting: Emergency Medicine

## 2022-11-02 ENCOUNTER — Other Ambulatory Visit: Payer: Self-pay

## 2022-11-02 ENCOUNTER — Other Ambulatory Visit (HOSPITAL_BASED_OUTPATIENT_CLINIC_OR_DEPARTMENT_OTHER): Payer: Self-pay

## 2022-11-02 DIAGNOSIS — R197 Diarrhea, unspecified: Secondary | ICD-10-CM | POA: Diagnosis not present

## 2022-11-02 DIAGNOSIS — F1123 Opioid dependence with withdrawal: Secondary | ICD-10-CM | POA: Insufficient documentation

## 2022-11-02 DIAGNOSIS — R112 Nausea with vomiting, unspecified: Secondary | ICD-10-CM | POA: Diagnosis not present

## 2022-11-02 DIAGNOSIS — R252 Cramp and spasm: Secondary | ICD-10-CM | POA: Diagnosis not present

## 2022-11-02 DIAGNOSIS — F1193 Opioid use, unspecified with withdrawal: Secondary | ICD-10-CM

## 2022-11-02 DIAGNOSIS — F1120 Opioid dependence, uncomplicated: Secondary | ICD-10-CM | POA: Diagnosis present

## 2022-11-02 LAB — CBC WITH DIFFERENTIAL/PLATELET
Abs Immature Granulocytes: 0.06 10*3/uL (ref 0.00–0.07)
Basophils Absolute: 0 10*3/uL (ref 0.0–0.1)
Basophils Relative: 0 %
Eosinophils Absolute: 0 10*3/uL (ref 0.0–0.5)
Eosinophils Relative: 0 %
HCT: 36.5 % (ref 36.0–46.0)
Hemoglobin: 12.8 g/dL (ref 12.0–15.0)
Immature Granulocytes: 1 %
Lymphocytes Relative: 23 %
Lymphs Abs: 2.9 10*3/uL (ref 0.7–4.0)
MCH: 28.6 pg (ref 26.0–34.0)
MCHC: 35.1 g/dL (ref 30.0–36.0)
MCV: 81.7 fL (ref 80.0–100.0)
Monocytes Absolute: 0.8 10*3/uL (ref 0.1–1.0)
Monocytes Relative: 6 %
Neutro Abs: 9 10*3/uL — ABNORMAL HIGH (ref 1.7–7.7)
Neutrophils Relative %: 70 %
Platelets: 359 10*3/uL (ref 150–400)
RBC: 4.47 MIL/uL (ref 3.87–5.11)
RDW: 12.9 % (ref 11.5–15.5)
WBC: 12.7 10*3/uL — ABNORMAL HIGH (ref 4.0–10.5)
nRBC: 0 % (ref 0.0–0.2)

## 2022-11-02 LAB — MAGNESIUM: Magnesium: 2 mg/dL (ref 1.7–2.4)

## 2022-11-02 LAB — COMPREHENSIVE METABOLIC PANEL
ALT: 16 U/L (ref 0–44)
AST: 14 U/L — ABNORMAL LOW (ref 15–41)
Albumin: 5 g/dL (ref 3.5–5.0)
Alkaline Phosphatase: 48 U/L (ref 38–126)
Anion gap: 8 (ref 5–15)
BUN: 11 mg/dL (ref 6–20)
CO2: 26 mmol/L (ref 22–32)
Calcium: 10.3 mg/dL (ref 8.9–10.3)
Chloride: 103 mmol/L (ref 98–111)
Creatinine, Ser: 0.79 mg/dL (ref 0.44–1.00)
GFR, Estimated: >60 mL/min (ref >60)
Glucose, Bld: 129 mg/dL — ABNORMAL HIGH (ref 70–99)
Potassium: 3.8 mmol/L (ref 3.5–5.1)
Sodium: 137 mmol/L (ref 135–145)
Total Bilirubin: 1.9 mg/dL — ABNORMAL HIGH (ref 0.3–1.2)
Total Protein: 7.7 g/dL (ref 6.5–8.1)

## 2022-11-02 LAB — CK: Total CK: 169 U/L (ref 38–234)

## 2022-11-02 MED ORDER — BUPRENORPHINE HCL-NALOXONE HCL 8-2 MG SL SUBL: 1.0000 | SUBLINGUAL_TABLET | Freq: Once | SUBLINGUAL | Status: DC

## 2022-11-02 NOTE — ED Provider Notes (Signed)
[place] EMERGENCY DEPARTMENT AT [facility] Provider Note   CSN: [id number] Arrival date & time: 11/02/22  4098     History  Chief Complaint  Patient presents with   Medication Reaction    [name] is a 42 y.o. female with past medical history significant for alcohol dependence, opioid use disorder, GAD, bipolar disorder presents to the ED with a possible reaction to Suboxone [place] that [facility] [place] [facility] on [place].  Patient states that she received the injection she has not been able to sleep, has had severe anxiety, vomiting and diarrhea.  She also reports muscle pain and cramping today.  Patient normally uses three 8 mg Suboxone strips per day that she receives from the clinic.      Home Medications Prior to Admission medications   Medication Sig Start Date End Date Taking? Authorizing Provider  gabapentin (NEURONTIN) 300 MG capsule Take 1 capsule (300 mg total) by mouth 3 (three) times daily. 08/03/21 09/02/21  Park Pope, MD  nicotine (NICODERM CQ - DOSED IN MG/24 HOURS) 21 mg/24hr patch Place 1 patch (21 mg total) onto the skin daily. 08/04/21   Park Pope, MD  nicotine polacrilex (NICORETTE) 2 MG gum Take 1 each (2 mg total) by mouth as needed for smoking cessation. 08/03/21   Park Pope, MD  OLANZapine (ZYPREXA) 5 MG tablet Take 1 tablet (5 mg total) by mouth at bedtime. 08/03/21 09/02/21  Park Pope, MD      Allergies    Cephalexin    Review of Systems   Review of Systems  Gastrointestinal:  Positive for diarrhea, nausea and vomiting. Negative for abdominal pain.  Musculoskeletal:  Positive for myalgias.  Psychiatric/Behavioral:  Positive for sleep disturbance. The patient is nervous/anxious.     Physical Exam Updated Vital Signs BP (!) 126/97 (BP Location: Right Arm)   Pulse 92   Temp 98.4 F (36.9 C) (Oral)   Resp 18   Ht 5\' 6"  (1.676 m)   Wt 58.5 kg   LMP 10/30/2022   SpO2 100%   BMI 20.82 kg/m  Physical Exam Vitals and  nursing note reviewed.  Constitutional:      General: She is not in acute distress.    Appearance: Normal appearance. She is not ill-appearing or diaphoretic.  Cardiovascular:     Rate and Rhythm: Normal rate and regular rhythm.     Pulses: Normal pulses.     Heart sounds: Normal heart sounds.  Pulmonary:     Effort: Pulmonary effort is normal. No tachypnea or respiratory distress.     Breath sounds: Normal breath sounds and air entry.  Abdominal:     General: Abdomen is flat.     Palpations: Abdomen is soft.     Tenderness: There is no abdominal tenderness.  Skin:    General: Skin is warm and dry.     Capillary Refill: Capillary refill takes less than 2 seconds.  Neurological:     Mental Status: She is alert. Mental status is at baseline.  Psychiatric:        Mood and Affect: Mood is anxious.        Speech: Speech is rapid and pressured.        Behavior: Behavior normal.     Comments: Unable to sit still     ED Results / Procedures / Treatments   Labs (all labs ordered are listed, but only abnormal results are displayed) Labs Reviewed  CBC WITH DIFFERENTIAL/PLATELET - Abnormal; Notable for the  following components:      Result Value   WBC 12.7 (*)    Neutro Abs 9.0 (*)    All other components within normal limits  COMPREHENSIVE METABOLIC PANEL  MAGNESIUM  CK    EKG None  Radiology No results found.  Procedures Procedures    Medications Ordered in ED Medications - No data to display  ED Course/ Medical Decision Making/ A&P                                 Medical Decision Making Amount and/or Complexity of Data Reviewed Labs: ordered.   This patient presents to the ED with chief complaint(s) of N/V/D, muscle cramps, insomnia, anxiety with pertinent past medical history of opioid use disorder, GAD, bipolar disorder, alcohol dependence.  The complaint involves an extensive differential diagnosis and also carries with it a high risk of complications and  morbidity.    The differential diagnosis includes opiate withdrawal, acute medication reaction, viral syndrome, rhabdomyolysis   The initial plan is to obtain labs  Additional history obtained: Records reviewed  - patient previously used heroin.  She reports that she has not used any since starting on Suboxone daily.  Initial Assessment:   On exam, patient appears anxious and is unable to sit still.  Skin is warm and dry, no visible sweating.  She is not actively vomiting.  Abdomen is soft and non tender to palpation.  Heart rate is in the 90s with regular rhythm.  Speech is rapid and pressured.  PERRL.  Pupils are not pinpoint or dilated.    Independent ECG/labs interpretation:  The following labs were independently interpreted:  CBC with leukocytosis, no anemia.  Metabolic panel without electrolyte disturbance.  Total bilirubin elevated.  CK within normal limits.  Magnesium within normal limits.  Treatment and Reassessment: Based on physical exam and COWS score of 12, suspect patient is going through mild withdrawal symptoms.  Patient normally on SL Suboxone 3 times daily and has not had any since.  Patient anticipates going to the clinic later today.  Will give dose of 8 mg Suboxone while in ED to help with withdrawal symptoms.   Low suspicion for medication reaction to Suboxone injection she received.  Unfortunately, Suboxone not readily available at [facility], but could be couriered over.  Patient reports she does not want to wait and will go to Suboxone clinic.   Disposition:   Patient with mild opiate withdrawal.  Workup is overall reassuring.  Discussed with patient resuming normal dosing of Suboxone as I suspect IM injection is not preventing withdrawal symptoms.  Patient left ED prior to receiving discharge paperwork.   The patient has been appropriately medically screened and/or stabilized in the ED. I have low suspicion for any other emergent medical condition which would  require further screening, evaluation or treatment in the ED or require inpatient management. At time of discharge the patient is hemodynamically stable and in no acute distress. I have discussed work-up results and diagnosis with patient and answered all questions. Patient is agreeable with discharge plan. We discussed strict return precautions for returning to the emergency department and they verbalized understanding.    Social Determinants of Health:   Patient's  tobacco dependence  increases the complexity of managing their presentation         Final Clinical Impression(s) / ED Diagnoses Final diagnoses:  None    Rx / DC Orders ED  Discharge Orders     None         Lenard Simmer, PA-C 11/02/22 1011    Alvira Monday, MD 11/02/22 [phone]

## 2022-11-02 NOTE — ED Triage Notes (Signed)
  Patient comes in with possible reaction to suboxone injection she received in jail on Wednesday afternoon.  Patient states since injection she has not been able to sleep, has had severe anxiety, and diarrhea.  Endorses muscle pain and cramping.  Pain 8/10, aching.

## 2022-11-02 NOTE — Discharge Instructions (Signed)
Thank you for allowing [place] to be a part of your care.  Your workup today is overall reassuring.  Your symptoms are likely related to withdrawal.  I recommend following up with your provider that provides your Suboxone.
